# Patient Record
Sex: Female | Born: 1945
Health system: Southern US, Community
[De-identification: ages and names within clinical notes are randomized; demographics above are authoritative.]

## PROBLEM LIST (undated history)

## (undated) DIAGNOSIS — Z86010 Personal history of colonic polyps: Secondary | ICD-10-CM

## (undated) DIAGNOSIS — E785 Hyperlipidemia, unspecified: Secondary | ICD-10-CM

## (undated) DIAGNOSIS — M1990 Unspecified osteoarthritis, unspecified site: Secondary | ICD-10-CM

## (undated) DIAGNOSIS — Z720 Tobacco use: Secondary | ICD-10-CM

## (undated) DIAGNOSIS — E039 Hypothyroidism, unspecified: Secondary | ICD-10-CM

## (undated) DIAGNOSIS — E079 Disorder of thyroid, unspecified: Secondary | ICD-10-CM

## (undated) DIAGNOSIS — H269 Unspecified cataract: Secondary | ICD-10-CM

## (undated) HISTORY — PX: CATARACT EXTRACTION: SUR2

## (undated) HISTORY — DX: Unspecified cataract: H26.9

## (undated) HISTORY — DX: Tobacco use: Z72.0

## (undated) HISTORY — PX: COLONOSCOPY: SHX174

## (undated) HISTORY — DX: Hyperlipidemia, unspecified: E78.5

## (undated) HISTORY — DX: Disorder of thyroid, unspecified: E07.9

## (undated) HISTORY — DX: Personal history of colonic polyps: Z86.010

---

## 1997-05-17 ENCOUNTER — Other Ambulatory Visit: Admission: RE | Admit: 1997-05-17 | Discharge: 1997-05-17 | Payer: Self-pay | Admitting: Obstetrics & Gynecology

## 1998-09-29 ENCOUNTER — Other Ambulatory Visit: Admission: RE | Admit: 1998-09-29 | Discharge: 1998-09-29 | Payer: Self-pay | Admitting: Obstetrics & Gynecology

## 1999-03-23 HISTORY — PX: VAGINAL HYSTERECTOMY: SUR661

## 1999-05-20 ENCOUNTER — Inpatient Hospital Stay (HOSPITAL_COMMUNITY): Admission: RE | Admit: 1999-05-20 | Discharge: 1999-05-23 | Payer: Self-pay | Admitting: Obstetrics & Gynecology

## 1999-12-29 ENCOUNTER — Other Ambulatory Visit: Admission: RE | Admit: 1999-12-29 | Discharge: 1999-12-29 | Payer: Self-pay | Admitting: Obstetrics & Gynecology

## 2001-03-17 ENCOUNTER — Other Ambulatory Visit: Admission: RE | Admit: 2001-03-17 | Discharge: 2001-03-17 | Payer: Self-pay | Admitting: Family Medicine

## 2002-05-15 ENCOUNTER — Other Ambulatory Visit: Admission: RE | Admit: 2002-05-15 | Discharge: 2002-05-15 | Payer: Self-pay | Admitting: Obstetrics & Gynecology

## 2003-11-05 ENCOUNTER — Other Ambulatory Visit: Admission: RE | Admit: 2003-11-05 | Discharge: 2003-11-05 | Payer: Self-pay | Admitting: Obstetrics & Gynecology

## 2013-09-19 HISTORY — PX: ROTATOR CUFF REPAIR: SHX139

## 2014-04-24 ENCOUNTER — Encounter: Payer: Self-pay | Admitting: Internal Medicine

## 2014-05-27 ENCOUNTER — Ambulatory Visit (AMBULATORY_SURGERY_CENTER): Payer: Self-pay

## 2014-05-27 VITALS — Ht 66.0 in | Wt 181.8 lb

## 2014-05-27 DIAGNOSIS — Z1211 Encounter for screening for malignant neoplasm of colon: Secondary | ICD-10-CM

## 2014-05-27 NOTE — Progress Notes (Signed)
No allergies to eggs or soy No diet/weight loss meds No home oxygen No past problems with anesthesia  No email 

## 2014-06-04 ENCOUNTER — Other Ambulatory Visit (HOSPITAL_COMMUNITY): Payer: Self-pay | Admitting: Orthopedic Surgery

## 2014-06-05 ENCOUNTER — Encounter: Payer: Self-pay | Admitting: Endocrinology

## 2014-06-05 ENCOUNTER — Ambulatory Visit (INDEPENDENT_AMBULATORY_CARE_PROVIDER_SITE_OTHER): Payer: Medicare HMO | Admitting: Endocrinology

## 2014-06-05 VITALS — BP 120/70 | HR 74 | Temp 97.5°F | Ht 66.0 in | Wt 181.0 lb

## 2014-06-05 DIAGNOSIS — E039 Hypothyroidism, unspecified: Secondary | ICD-10-CM

## 2014-06-05 MED ORDER — LEVOTHYROXINE SODIUM 125 MCG PO TABS: 125.0000 ug | ORAL_TABLET | Freq: Every day | ORAL | Status: DC

## 2014-06-05 NOTE — Patient Instructions (Signed)
i have sent a prescription to your pharmacy, to refill the levothyroxine.   Please return in 1 year.  please call [phone], to get an appointment with a new primary provider.        Hypothyroidism The thyroid is a large gland located in the lower front of your neck. The thyroid gland helps control metabolism. Metabolism is how your body handles food. It controls metabolism with the hormone thyroxine. When this gland is underactive (hypothyroid), it produces too little hormone.  CAUSES These include:   Absence or destruction of thyroid tissue.  Goiter due to iodine deficiency.  Goiter due to medications.  Congenital defects (since birth).  Problems with the pituitary. This causes a lack of TSH (thyroid stimulating hormone). This hormone tells the thyroid to turn out more hormone. SYMPTOMS  Lethargy (feeling as though you have no energy)  Cold intolerance  Weight gain (in spite of normal food intake)  Dry skin  Coarse hair  Menstrual irregularity (if severe, may lead to infertility)  Slowing of thought processes Cardiac problems are also caused by insufficient amounts of thyroid hormone. Hypothyroidism in the newborn is cretinism, and is an extreme form. It is important that this form be treated adequately and immediately or it will lead rapidly to retarded physical and mental development. DIAGNOSIS  To prove hypothyroidism, your caregiver may do blood tests and ultrasound tests. Sometimes the signs are hidden. It may be necessary for your caregiver to watch this illness with blood tests either before or after diagnosis and treatment. TREATMENT  Low levels of thyroid hormone are increased by using synthetic thyroid hormone. This is a safe, effective treatment. It usually takes about four weeks to gain the full effects of the medication. After you have the full effect of the medication, it will generally take another four weeks for problems to leave. Your caregiver may start you  on low doses. If you have had heart problems the dose may be gradually increased. It is generally not an emergency to get rapidly to normal. HOME CARE INSTRUCTIONS   Take your medications as your caregiver suggests. Let your caregiver know of any medications you are taking or start taking. Your caregiver will help you with dosage schedules.  As your condition improves, your dosage needs may increase. It will be necessary to have continuing blood tests as suggested by your caregiver.  Report all suspected medication side effects to your caregiver. SEEK MEDICAL CARE IF: Seek medical care if you develop:  Sweating.  Tremulousness (tremors).  Anxiety.  Rapid weight loss.  Heat intolerance.  Emotional swings.  Diarrhea.  Weakness. SEEK IMMEDIATE MEDICAL CARE IF:  You develop chest pain, an irregular heart beat (palpitations), or a rapid heart beat. MAKE SURE YOU:   Understand these instructions.  Will watch your condition.  Will get help right away if you are not doing well or get worse. Document Released: 03/08/2005 Document Revised: 05/31/2011 Document Reviewed: 10/27/2007 [facility] Patient Information 2015 [place], [place]. This information is not intended to replace advice given to you by your health care provider. Make sure you discuss any questions you have with your health care provider.

## 2014-06-05 NOTE — Progress Notes (Signed)
Subjective:    Patient ID: Bianca Collins, female    DOB: 09/01/1945, 69 y.o.   MRN: [id number]  HPI Pt reports hypothyroidism was dx'ed in 2002.  she has been on prescribed thyroid hormone therapy continuously since then.  she has never taken kelp or any other type of non-prescribed thyroid product. she has never had thyroid imaging.  She is not considering a pregnancy.  she has never had thyroid surgery, or XRT to the neck.  she has never been on amiodarone or lithium.  She ran out of her synthroid 3 days ago.  She has moderate pain at the right hip, but no assoc numbness (she will have THR soon).   Past Medical History  Diagnosis Date  . Tobacco abuse     Past Surgical History  Procedure Laterality Date  . Vaginal hysterectomy  2001    complete  . Rotator cuff repair  09-2013    History   Social History  . Marital Status: Divorced    Spouse Name: N/A  . Number of Children: N/A  . Years of Education: N/A   Occupational History  . Not on file.   Social History Main Topics  . Smoking status: Current Every Day Smoker  . Smokeless tobacco: Never Used  . Alcohol Use: 8.4 oz/week    14 Glasses of wine per week  . Drug Use: No  . Sexual Activity: Not on file   Other Topics Concern  . Not on file   Social History Narrative    Current Outpatient Prescriptions on File Prior to Visit  Medication Sig Dispense Refill  . Ascorbic Acid (VITAMIN C PO) Take 1 tablet by mouth 3 (three) times a week.     . estradiol (ESTRACE) 1 MG tablet Take 1 mg by mouth daily.    . Multiple Vitamin (MULTIVITAMIN) tablet Take 1 tablet by mouth daily.     No current facility-administered medications on file prior to visit.    No Known Allergies  Family History  Problem Relation Age of Onset  . Colon cancer Neg Hx   . Thyroid disease Neg Hx     BP 120/70 mmHg  Pulse 74  Temp(Src) 97.5 F (36.4 C) (Oral)  Ht 5\' 6"  (1.676 m)  Wt 181 lb (82.101 kg)  BMI 29.23 kg/m2  SpO2  95%     Review of Systems denies depression, hair loss, muscle cramps, sob, weight gain, constipation, numbness, blurry vision, cold intolerance, myalgias, dry skin, rhinorrhea, easy bruising, and syncope.  She has constipation.    Objective:   Physical Exam VS: see vs page GEN: no distress HEAD: head: no deformity eyes: no periorbital swelling, no proptosis external nose and ears are normal mouth: no lesion seen NECK: supple, thyroid is not enlarged CHEST WALL: no deformity LUNGS:  Clear to auscultation.   CV: reg rate and rhythm, no murmur ABD: abdomen is soft, nontender.  no hepatosplenomegaly.  not distended.  no hernia MUSCULOSKELETAL: muscle bulk and strength are grossly normal.  no obvious joint swelling.  gait is normal and steady EXTEMITIES: no deformity. no edema PULSES: no carotid bruit NEURO:  cn 2-12 grossly intact.   readily moves all 4's.  sensation is intact to touch on all 4's SKIN:  Normal texture and temperature.  No rash or suspicious lesion is visible.   NODES:  None palpable at the neck. PSYCH: alert, well-oriented.  Does not appear anxious nor depressed.    outside test results are reviewed: TSH=normal  i have reviewed the following old records: Office notes: she is ween for hypothyroidism.  She has minor sxs, and no abnormalities on physical exam     Assessment & Plan:  Hypothyroidism: well-replaced.  Patient is advised the following: Patient Instructions  i have sent a prescription to your pharmacy, to refill the levothyroxine.   Please return in 1 year.  please call [phone], to get an appointment with a new primary provider.        Hypothyroidism The thyroid is a large gland located in the lower front of your neck. The thyroid gland helps control metabolism. Metabolism is how your body handles food. It controls metabolism with the hormone thyroxine. When this gland is underactive (hypothyroid), it produces too little hormone.   CAUSES These include:   Absence or destruction of thyroid tissue.  Goiter due to iodine deficiency.  Goiter due to medications.  Congenital defects (since birth).  Problems with the pituitary. This causes a lack of TSH (thyroid stimulating hormone). This hormone tells the thyroid to turn out more hormone. SYMPTOMS  Lethargy (feeling as though you have no energy)  Cold intolerance  Weight gain (in spite of normal food intake)  Dry skin  Coarse hair  Menstrual irregularity (if severe, may lead to infertility)  Slowing of thought processes Cardiac problems are also caused by insufficient amounts of thyroid hormone. Hypothyroidism in the newborn is cretinism, and is an extreme form. It is important that this form be treated adequately and immediately or it will lead rapidly to retarded physical and mental development. DIAGNOSIS  To prove hypothyroidism, your caregiver may do blood tests and ultrasound tests. Sometimes the signs are hidden. It may be necessary for your caregiver to watch this illness with blood tests either before or after diagnosis and treatment. TREATMENT  Low levels of thyroid hormone are increased by using synthetic thyroid hormone. This is a safe, effective treatment. It usually takes about four weeks to gain the full effects of the medication. After you have the full effect of the medication, it will generally take another four weeks for problems to leave. Your caregiver may start you on low doses. If you have had heart problems the dose may be gradually increased. It is generally not an emergency to get rapidly to normal. HOME CARE INSTRUCTIONS   Take your medications as your caregiver suggests. Let your caregiver know of any medications you are taking or start taking. Your caregiver will help you with dosage schedules.  As your condition improves, your dosage needs may increase. It will be necessary to have continuing blood tests as suggested by your  caregiver.  Report all suspected medication side effects to your caregiver. SEEK MEDICAL CARE IF: Seek medical care if you develop:  Sweating.  Tremulousness (tremors).  Anxiety.  Rapid weight loss.  Heat intolerance.  Emotional swings.  Diarrhea.  Weakness. SEEK IMMEDIATE MEDICAL CARE IF:  You develop chest pain, an irregular heart beat (palpitations), or a rapid heart beat. MAKE SURE YOU:   Understand these instructions.  Will watch your condition.  Will get help right away if you are not doing well or get worse. Document Released: 03/08/2005 Document Revised: 05/31/2011 Document Reviewed: 10/27/2007 [facility] Patient Information 2015 [place], [place]. This information is not intended to replace advice given to you by your health care provider. Make sure you discuss any questions you have with your health care provider.

## 2014-06-06 DIAGNOSIS — E039 Hypothyroidism, unspecified: Secondary | ICD-10-CM | POA: Insufficient documentation

## 2014-06-07 ENCOUNTER — Encounter: Payer: Self-pay | Admitting: Internal Medicine

## 2014-06-07 ENCOUNTER — Ambulatory Visit (AMBULATORY_SURGERY_CENTER): Payer: Medicare HMO | Admitting: Internal Medicine

## 2014-06-07 VITALS — BP 96/52 | HR 59 | Temp 96.6°F | Resp 18 | Ht 66.0 in | Wt 181.0 lb

## 2014-06-07 DIAGNOSIS — Z1211 Encounter for screening for malignant neoplasm of colon: Secondary | ICD-10-CM

## 2014-06-07 DIAGNOSIS — D123 Benign neoplasm of transverse colon: Secondary | ICD-10-CM

## 2014-06-07 MED ORDER — SODIUM CHLORIDE 0.9 % IV SOLN: 500.0000 mL | INTRAVENOUS | Status: DC

## 2014-06-07 NOTE — Op Note (Signed)
[place]  [address] [place], [id number]   COLONOSCOPY PROCEDURE REPORT  PATIENT: Bianca Collins, Bianca Collins  MR#: [id number] BIRTHDATE: [birth year] , 28  yrs. old GENDER: female ENDOSCOPIST: Gatha Mayer, MD, [facility] PROCEDURE DATE:  06/07/2014 PROCEDURE:   Colonoscopy, screening and Colonoscopy with biopsy First Screening Colonoscopy - Avg.  risk and is 50 yrs.  old or older Yes.  Prior Negative Screening - Now for repeat screening. N/A  History of Adenoma - Now for follow-up colonoscopy & has been > or = to 3 yrs.  N/A ASA CLASS:   Class II INDICATIONS:Screening for colonic neoplasia and Colorectal Neoplasm Risk Assessment for this procedure is average risk. MEDICATIONS: Propofol 240 mg IV and Monitored anesthesia care  DESCRIPTION OF PROCEDURE:   After the risks benefits and alternatives of the procedure were thoroughly explained, informed consent was obtained.  The digital rectal exam revealed no abnormalities of the rectum.   The LB [id number]  endoscope was introduced through the anus and advanced to the cecum, which was identified by both the appendix and ileocecal valve. No adverse events experienced.   The quality of the prep was good.  (MiraLax was used)  The instrument was then slowly withdrawn as the colon was fully examined.      COLON FINDINGS: A sessile polyp measuring 3 mm in size was found in the transverse colon.  A polypectomy was performed with cold forceps.  The resection was complete, the polyp tissue was completely retrieved and sent to histology.   There was diverticulosis noted in the sigmoid colon.   The examination was otherwise normal.   Right colon retroflexion included.  Retroflexed views revealed no abnormalities. The time to cecum = 2.2 Withdrawal time = 12.0   The scope was withdrawn and the procedure completed. COMPLICATIONS: There were no immediate complications.  ENDOSCOPIC IMPRESSION: 1.   Sessile polyp was found in the  transverse colon; polypectomy was performed with cold forceps 2.   Diverticulosis was noted in the sigmoid colon 3.   The examination was otherwise normal - good prep - first screening  RECOMMENDATIONS: Timing of repeat colonoscopy will be determined by pathology findings.  eSigned:  Gatha Mayer, MD, [facility] 06/07/2014 3:12 PM   cc: The Patient

## 2014-06-07 NOTE — Patient Instructions (Addendum)
I found and removed one small polyp that looks benign. You also have a condition called diverticulosis - common and not usually a problem. Please read the handout provided.  I will let you know pathology results and when to have another routine colonoscopy by mail.  I appreciate the opportunity to care for you. Gatha Mayer, MD, FACG  YOU HAD AN ENDOSCOPIC PROCEDURE TODAY AT [facility]:   Refer to the procedure report that was given to you for any specific questions about what was found during the examination.  If the procedure report does not answer your questions, please call your gastroenterologist to clarify.  If you requested that your care partner not be given the details of your procedure findings, then the procedure report has been included in a sealed envelope for you to review at your convenience later.  YOU SHOULD EXPECT: Some feelings of bloating in the abdomen. Passage of more gas than usual.  Walking can help get rid of the air that was put into your GI tract during the procedure and reduce the bloating. If you had a lower endoscopy (such as a colonoscopy or flexible sigmoidoscopy) you may notice spotting of blood in your stool or on the toilet paper. If you underwent a bowel prep for your procedure, you may not have a normal bowel movement for a few days.  Please Note:  You might notice some irritation and congestion in your nose or some drainage.  This is from the oxygen used during your procedure.  There is no need for concern and it should clear up in a day or so.  SYMPTOMS TO REPORT IMMEDIATELY:   Following lower endoscopy (colonoscopy or flexible sigmoidoscopy):  Excessive amounts of blood in the stool  Significant tenderness or worsening of abdominal pains  Swelling of the abdomen that is new, acute  Fever of 100F or higher  For urgent or emergent issues, a gastroenterologist can be reached at any hour by calling [phone].   DIET: Your  first meal following the procedure should be a small meal and then it is ok to progress to your normal diet. Heavy or fried foods are harder to digest and may make you feel nauseous or bloated.  Likewise, meals heavy in dairy and vegetables can increase bloating.  Drink plenty of fluids but you should avoid alcoholic beverages for 24 hours.  ACTIVITY:  You should plan to take it easy for the rest of today and you should NOT DRIVE or use heavy machinery until tomorrow (because of the sedation medicines used during the test).    FOLLOW UP: Our staff will call the number listed on your records the next business day following your procedure to check on you and address any questions or concerns that you may have regarding the information given to you following your procedure. If we do not reach you, we will leave a message.  However, if you are feeling well and you are not experiencing any problems, there is no need to return our call.  We will assume that you have returned to your regular daily activities without incident.  If any biopsies were taken you will be contacted by phone or by letter within the next 1-3 weeks.  Please call us at [phone] if you have not heard about the biopsies in 3 weeks.    SIGNATURES/CONFIDENTIALITY: You and/or your care partner have signed paperwork which will be entered into your electronic medical record.  These signatures attest  to the fact that that the information above on your After Visit Summary has been reviewed and is understood.  Full responsibility of the confidentiality of this discharge information lies with you and/or your care-partner.  Polyp and diverticulosis information given.

## 2014-06-07 NOTE — Progress Notes (Signed)
To recovery, report to Scott, RN, VSS 

## 2014-06-07 NOTE — Progress Notes (Signed)
Called to room to assist during endoscopic procedure.  Patient ID and intended procedure confirmed with present staff. Received instructions for my participation in the procedure from the performing physician.  

## 2014-06-08 NOTE — Pre-Procedure Instructions (Signed)
Bianca Collins  06/08/2014   Your procedure is scheduled on:  March 30  Report to [facility] Admitting at 08:40 AM.  Call this number if you have problems the morning of surgery: [phone]   Remember:   Do not eat food or drink liquids after midnight.   Take these medicines the morning of surgery with A SIP OF WATER: Levothyroxine   STOP Vitamin C, Multiple Vitamin March 23   STOP/ Do not take Aspirin, Aleve, Naproxen, Advil, Ibuprofen, Motrin, Vitamins, Herbs, or Supplements starting March 23   Do not wear jewelry, make-up or nail polish.  Do not wear lotions, powders, or perfumes. You may wear deodorant.  Do not shave 48 hours prior to surgery. Men may shave face and neck.  Do not bring valuables to the hospital.  [facility] is not responsible for any belongings or valuables.               Contacts, dentures or bridgework may not be worn into surgery.  Leave suitcase in the car. After surgery it may be brought to your room.  For patients admitted to the hospital, discharge time is determined by your treatment team.               Special Instructions: [place] - Preparing for Surgery  Before surgery, you can play an important role.  Because skin is not sterile, your skin needs to be as free of germs as possible.  You can reduce the number of germs on you skin by washing with CHG (chlorahexidine gluconate) soap before surgery.  CHG is an antiseptic cleaner which kills germs and bonds with the skin to continue killing germs even after washing.  Please DO NOT use if you have an allergy to CHG or antibacterial soaps.  If your skin becomes reddened/irritated stop using the CHG and inform your nurse when you arrive at Short Stay.  Do not shave (including legs and underarms) for at least 48 hours prior to the first CHG shower.  You may shave your face.  Please follow these instructions carefully:   1.  Shower with CHG Soap the night before surgery and the morning of  Surgery.  2.  If you choose to wash your hair, wash your hair first as usual with your normal shampoo.  3.  After you shampoo, rinse your hair and body thoroughly to remove the shampoo.  4.  Use CHG as you would any other liquid soap.  You can apply CHG directly to the skin and wash gently with scrungie or a clean washcloth.  5.  Apply the CHG Soap to your body ONLY FROM THE NECK DOWN.  Do not use on open wounds or open sores.  Avoid contact with your eyes, ears, mouth and genitals (private parts).  Wash genitals (private parts) with your normal soap.  6.  Wash thoroughly, paying special attention to the area where your surgery will be performed.  7.  Thoroughly rinse your body with warm water from the neck down.  8.  DO NOT shower/wash with your normal soap after using and rinsing off the CHG Soap.  9.  Pat yourself dry with a clean towel.            10.  Wear clean pajamas.            11.  Place clean sheets on your bed the night of your first shower and do not sleep with pets.  Day of Surgery  Do not apply any lotions the morning of surgery.  Please wear clean clothes to the hospital/surgery center.     Please read over the following fact sheets that you were given: Pain Booklet, Coughing and Deep Breathing, Total Joint Packet and Surgical Site Infection Prevention

## 2014-06-10 ENCOUNTER — Encounter (HOSPITAL_COMMUNITY)
Admission: RE | Admit: 2014-06-10 | Discharge: 2014-06-10 | Disposition: A | Discharge status: Home / Self Care | Payer: Medicare HMO | Source: Ambulatory Visit | Attending: Orthopedic Surgery | Admitting: Orthopedic Surgery

## 2014-06-10 ENCOUNTER — Telehealth: Payer: Self-pay

## 2014-06-10 ENCOUNTER — Ambulatory Visit (HOSPITAL_COMMUNITY)
Admission: RE | Admit: 2014-06-10 | Discharge: 2014-06-10 | Disposition: A | Discharge status: Home / Self Care | Payer: Medicare HMO | Source: Ambulatory Visit | Attending: Orthopedic Surgery | Admitting: Orthopedic Surgery

## 2014-06-10 ENCOUNTER — Encounter (HOSPITAL_COMMUNITY): Payer: Self-pay

## 2014-06-10 DIAGNOSIS — F17200 Nicotine dependence, unspecified, uncomplicated: Secondary | ICD-10-CM | POA: Diagnosis not present

## 2014-06-10 DIAGNOSIS — Z01812 Encounter for preprocedural laboratory examination: Secondary | ICD-10-CM | POA: Insufficient documentation

## 2014-06-10 DIAGNOSIS — M1611 Unilateral primary osteoarthritis, right hip: Secondary | ICD-10-CM | POA: Insufficient documentation

## 2014-06-10 DIAGNOSIS — E039 Hypothyroidism, unspecified: Secondary | ICD-10-CM | POA: Diagnosis not present

## 2014-06-10 DIAGNOSIS — Z01818 Encounter for other preprocedural examination: Secondary | ICD-10-CM | POA: Insufficient documentation

## 2014-06-10 DIAGNOSIS — J984 Other disorders of lung: Secondary | ICD-10-CM | POA: Insufficient documentation

## 2014-06-10 HISTORY — DX: Hypothyroidism, unspecified: E03.9

## 2014-06-10 HISTORY — DX: Unspecified osteoarthritis, unspecified site: M19.90

## 2014-06-10 LAB — PROTIME-INR
INR: 0.97 (ref 0.00–1.49)
Prothrombin Time: 12.9 seconds (ref 11.6–15.2)

## 2014-06-10 LAB — CBC
HCT: 45.5 % (ref 36.0–46.0)
Hemoglobin: 15.5 g/dL — ABNORMAL HIGH (ref 12.0–15.0)
MCH: 33.2 pg (ref 26.0–34.0)
MCHC: 34.1 g/dL (ref 30.0–36.0)
MCV: 97.4 fL (ref 78.0–100.0)
Platelets: 235 10*3/uL (ref 150–400)
RBC: 4.67 MIL/uL (ref 3.87–5.11)
RDW: 13.1 % (ref 11.5–15.5)
WBC: 10 10*3/uL (ref 4.0–10.5)

## 2014-06-10 LAB — COMPREHENSIVE METABOLIC PANEL
ALK PHOS: 64 U/L (ref 39–117)
ALT: 12 U/L (ref 0–35)
ANION GAP: 8 (ref 5–15)
AST: 15 U/L (ref 0–37)
Albumin: 4 g/dL (ref 3.5–5.2)
BILIRUBIN TOTAL: 0.4 mg/dL (ref 0.3–1.2)
BUN: 11 mg/dL (ref 6–23)
CHLORIDE: 102 mmol/L (ref 96–112)
CO2: 28 mmol/L (ref 19–32)
Calcium: 9.2 mg/dL (ref 8.4–10.5)
Creatinine, Ser: 0.87 mg/dL (ref 0.50–1.10)
GFR calc Af Amer: 79 mL/min — ABNORMAL LOW (ref >90)
GFR calc non Af Amer: 68 mL/min — ABNORMAL LOW (ref >90)
Glucose, Bld: 96 mg/dL (ref 70–99)
POTASSIUM: 4.2 mmol/L (ref 3.5–5.1)
Sodium: 138 mmol/L (ref 135–145)
Total Protein: 7.2 g/dL (ref 6.0–8.3)

## 2014-06-10 LAB — SURGICAL PCR SCREEN
MRSA, PCR: NEGATIVE
Staphylococcus aureus: NEGATIVE

## 2014-06-10 LAB — APTT: APTT: 36 s (ref 24–37)

## 2014-06-10 NOTE — Progress Notes (Addendum)
Pt denies any heart problems and has never seen a cardiologist.  DA I have called [facility] for an old EKG for comparison.  They do not have one.  Patient also saw Dr. Jaci Carrel, who has since left the practice.   DA

## 2014-06-10 NOTE — Telephone Encounter (Signed)
  Follow up Call-  Call back number 06/07/2014  Post procedure Call Back phone  # [phone]  Permission to leave phone message Yes     Patient questions:  Do you have a fever, pain , or abdominal swelling? No. Pain Score  0 *  Have you tolerated food without any problems? Yes.    Have you been able to return to your normal activities? Yes.    Do you have any questions about your discharge instructions: Diet   No. Medications  No. Follow up visit  No.  Do you have questions or concerns about your Care? No.  Actions: * If pain score is 4 or above: No action needed, pain <4.

## 2014-06-10 NOTE — Progress Notes (Addendum)
Anesthesia Chart Review:  Pt is 69 year old female scheduled for R total hip arthroplasty on 06/19/2014 with Dr. Sharol Given.   PMH includes: hypothyroidism. Current smoker. BMI 29.   Preoperative labs reviewed.    Chest x-ray reviewed.  -No radiographic evidence of acute cardiopulmonary disease.  -Nodular density at the base the right lung as well as a small 2 mm-3 mm in density at the upper aspect of the right lung. Given the patient's smoking history, a non-emergent noncontrast CT of the chest is recommended for further evaluation.  -Asymmetric elevation of the right hemidiaphragm.  Called and left voicemail for [place] in Dr. Jess Barters office about abnormal CXR findings.   EKG: Sinus bradycardia (55 bpm). Cannot rule out Anterior infarct, age undetermined. No old EKG for comparison.   If no changes, I anticipate pt can proceed with surgery as scheduled.   Willeen Cass, FNP-BC [facility] Short Stay Surgical Center/Anesthesiology Phone: [phone] 06/10/2014 3:05 PM  Addendum: Received a phone call from [place] confirming she received message regarding abnormal CXR.  She will have Dr. Sharol Given review to determine follow-up recommendations.   George Hugh [facility] Short Stay Center/Anesthesiology Phone [phone] 06/13/2014 11:23 AM

## 2014-06-13 ENCOUNTER — Encounter: Payer: Self-pay | Admitting: Internal Medicine

## 2014-06-13 DIAGNOSIS — Z86010 Personal history of colonic polyps: Secondary | ICD-10-CM

## 2014-06-13 DIAGNOSIS — Z860101 Personal history of adenomatous and serrated colon polyps: Secondary | ICD-10-CM | POA: Insufficient documentation

## 2014-06-13 HISTORY — DX: Personal history of adenomatous and serrated colon polyps: Z86.0101

## 2014-06-13 HISTORY — DX: Personal history of colonic polyps: Z86.010

## 2014-06-13 NOTE — Progress Notes (Signed)
Quick Note:  3 mm adenoma - repeat colonoscopy 2021 ______

## 2014-06-18 MED ORDER — CEFAZOLIN SODIUM-DEXTROSE 2-3 GM-% IV SOLR
2.0000 g | INTRAVENOUS | Status: AC
  Administered 2014-06-19: 2 g via INTRAVENOUS
  Filled 2014-06-18: qty 50

## 2014-06-19 ENCOUNTER — Inpatient Hospital Stay (HOSPITAL_COMMUNITY): Payer: Medicare HMO

## 2014-06-19 ENCOUNTER — Inpatient Hospital Stay (HOSPITAL_COMMUNITY): Payer: Medicare HMO | Admitting: Anesthesiology

## 2014-06-19 ENCOUNTER — Inpatient Hospital Stay (HOSPITAL_COMMUNITY)
Admission: RE | Admit: 2014-06-19 | Discharge: 2014-06-22 | DRG: 470 | Disposition: A | Discharge status: Home Health | Payer: Medicare HMO | Source: Ambulatory Visit | Attending: Orthopedic Surgery | Admitting: Orthopedic Surgery

## 2014-06-19 ENCOUNTER — Inpatient Hospital Stay (HOSPITAL_COMMUNITY): Payer: Medicare HMO | Admitting: Emergency Medicine

## 2014-06-19 ENCOUNTER — Encounter (HOSPITAL_COMMUNITY): Payer: Self-pay | Admitting: *Deleted

## 2014-06-19 ENCOUNTER — Encounter (HOSPITAL_COMMUNITY)
Admission: RE | Disposition: A | Discharge status: Home Health | Payer: Self-pay | Source: Ambulatory Visit | Attending: Orthopedic Surgery

## 2014-06-19 DIAGNOSIS — F17210 Nicotine dependence, cigarettes, uncomplicated: Secondary | ICD-10-CM | POA: Diagnosis present

## 2014-06-19 DIAGNOSIS — Z86010 Personal history of colonic polyps: Secondary | ICD-10-CM

## 2014-06-19 DIAGNOSIS — M1612 Unilateral primary osteoarthritis, left hip: Principal | ICD-10-CM | POA: Diagnosis present

## 2014-06-19 DIAGNOSIS — Z7982 Long term (current) use of aspirin: Secondary | ICD-10-CM | POA: Diagnosis not present

## 2014-06-19 DIAGNOSIS — R918 Other nonspecific abnormal finding of lung field: Secondary | ICD-10-CM

## 2014-06-19 DIAGNOSIS — E039 Hypothyroidism, unspecified: Secondary | ICD-10-CM | POA: Diagnosis present

## 2014-06-19 DIAGNOSIS — Z79899 Other long term (current) drug therapy: Secondary | ICD-10-CM

## 2014-06-19 DIAGNOSIS — Z96649 Presence of unspecified artificial hip joint: Secondary | ICD-10-CM

## 2014-06-19 DIAGNOSIS — Z90710 Acquired absence of both cervix and uterus: Secondary | ICD-10-CM | POA: Diagnosis not present

## 2014-06-19 DIAGNOSIS — Z96641 Presence of right artificial hip joint: Secondary | ICD-10-CM

## 2014-06-19 HISTORY — PX: TOTAL HIP ARTHROPLASTY: SHX124

## 2014-06-19 SURGERY — ARTHROPLASTY, HIP, TOTAL,POSTERIOR APPROACH
Anesthesia: Spinal | Site: Hip | Laterality: Right

## 2014-06-19 MED ORDER — LIDOCAINE HCL (CARDIAC) 20 MG/ML IV SOLN
INTRAVENOUS | Status: DC | PRN
  Administered 2014-06-19: 50 mg via INTRAVENOUS

## 2014-06-19 MED ORDER — ASPIRIN EC 325 MG PO TBEC
325.0000 mg | DELAYED_RELEASE_TABLET | Freq: Every day | ORAL | Status: DC
  Administered 2014-06-20 – 2014-06-22 (×3): 325 mg via ORAL
  Filled 2014-06-19 (×3): qty 1

## 2014-06-19 MED ORDER — SODIUM CHLORIDE 0.9 % IV SOLN
INTRAVENOUS | Status: DC
  Administered 2014-06-19: via INTRAVENOUS

## 2014-06-19 MED ORDER — SODIUM CHLORIDE 0.9 % IR SOLN
Status: DC | PRN
  Administered 2014-06-19: 1000 mL

## 2014-06-19 MED ORDER — KETOROLAC TROMETHAMINE 15 MG/ML IJ SOLN
7.5000 mg | Freq: Four times a day (QID) | INTRAMUSCULAR | Status: AC
  Administered 2014-06-19 – 2014-06-20 (×4): 7.5 mg via INTRAVENOUS
  Filled 2014-06-19 (×4): qty 1

## 2014-06-19 MED ORDER — BISACODYL 5 MG PO TBEC
5.0000 mg | DELAYED_RELEASE_TABLET | Freq: Every day | ORAL | Status: DC | PRN
Start: 2014-06-19 — End: 2014-06-22

## 2014-06-19 MED ORDER — MIDAZOLAM HCL 2 MG/2ML IJ SOLN
INTRAMUSCULAR | Status: AC
  Filled 2014-06-19: qty 2

## 2014-06-19 MED ORDER — PROPOFOL 10 MG/ML IV BOLUS
INTRAVENOUS | Status: DC | PRN
  Administered 2014-06-19: 30 mg via INTRAVENOUS

## 2014-06-19 MED ORDER — ONDANSETRON HCL 4 MG/2ML IJ SOLN
INTRAMUSCULAR | Status: DC | PRN
  Administered 2014-06-19: 4 mg via INTRAVENOUS

## 2014-06-19 MED ORDER — ROCURONIUM BROMIDE 50 MG/5ML IV SOLN
INTRAVENOUS | Status: AC
  Filled 2014-06-19: qty 1

## 2014-06-19 MED ORDER — MIDAZOLAM HCL 5 MG/5ML IJ SOLN
INTRAMUSCULAR | Status: DC | PRN
  Administered 2014-06-19: 2 mg via INTRAVENOUS

## 2014-06-19 MED ORDER — METHOCARBAMOL 1000 MG/10ML IJ SOLN: 500.0000 mg | Freq: Four times a day (QID) | INTRAVENOUS | Status: DC | PRN

## 2014-06-19 MED ORDER — HYDROMORPHONE HCL 1 MG/ML IJ SOLN: 0.2500 mg | INTRAMUSCULAR | Status: DC | PRN

## 2014-06-19 MED ORDER — PROMETHAZINE HCL 25 MG/ML IJ SOLN
6.2500 mg | INTRAMUSCULAR | Status: DC | PRN
Start: 2014-06-19 — End: 2014-06-19

## 2014-06-19 MED ORDER — EPHEDRINE SULFATE 50 MG/ML IJ SOLN
INTRAMUSCULAR | Status: DC | PRN
  Administered 2014-06-19 (×2): 10 mg via INTRAVENOUS

## 2014-06-19 MED ORDER — OXYCODONE HCL 5 MG PO TABS
5.0000 mg | ORAL_TABLET | ORAL | Status: DC | PRN
  Administered 2014-06-19 – 2014-06-21 (×7): 10 mg via ORAL
  Filled 2014-06-19 (×8): qty 2

## 2014-06-19 MED ORDER — ONDANSETRON HCL 4 MG PO TABS: 4.0000 mg | ORAL_TABLET | Freq: Four times a day (QID) | ORAL | Status: DC | PRN

## 2014-06-19 MED ORDER — LACTATED RINGERS IV SOLN
INTRAVENOUS | Status: DC
  Administered 2014-06-19 (×2): via INTRAVENOUS

## 2014-06-19 MED ORDER — DEXAMETHASONE SODIUM PHOSPHATE 4 MG/ML IJ SOLN
INTRAMUSCULAR | Status: AC
  Filled 2014-06-19: qty 2

## 2014-06-19 MED ORDER — KETOROLAC TROMETHAMINE 30 MG/ML IJ SOLN: 30.0000 mg | Freq: Once | INTRAMUSCULAR | Status: DC | PRN

## 2014-06-19 MED ORDER — PROPOFOL 10 MG/ML IV BOLUS
INTRAVENOUS | Status: AC
  Filled 2014-06-19: qty 20

## 2014-06-19 MED ORDER — FENTANYL CITRATE 0.05 MG/ML IJ SOLN
INTRAMUSCULAR | Status: DC | PRN
  Administered 2014-06-19: 50 ug via INTRAVENOUS

## 2014-06-19 MED ORDER — ONDANSETRON HCL 4 MG/2ML IJ SOLN: 4.0000 mg | Freq: Four times a day (QID) | INTRAMUSCULAR | Status: DC | PRN

## 2014-06-19 MED ORDER — LEVOTHYROXINE SODIUM 125 MCG PO TABS
125.0000 ug | ORAL_TABLET | Freq: Every day | ORAL | Status: DC
  Administered 2014-06-20 – 2014-06-22 (×3): 125 ug via ORAL
  Filled 2014-06-19 (×3): qty 1

## 2014-06-19 MED ORDER — SENNOSIDES-DOCUSATE SODIUM 8.6-50 MG PO TABS: 1.0000 | ORAL_TABLET | Freq: Every evening | ORAL | Status: DC | PRN

## 2014-06-19 MED ORDER — FENTANYL CITRATE 0.05 MG/ML IJ SOLN
INTRAMUSCULAR | Status: AC
  Filled 2014-06-19: qty 5

## 2014-06-19 MED ORDER — PROPOFOL INFUSION 10 MG/ML OPTIME
INTRAVENOUS | Status: DC | PRN
  Administered 2014-06-19: 75 ug/kg/min via INTRAVENOUS

## 2014-06-19 MED ORDER — METHOCARBAMOL 1000 MG/10ML IJ SOLN
500.0000 mg | INTRAVENOUS | Status: DC
  Administered 2014-06-19: 500 mg via INTRAVENOUS
  Filled 2014-06-19: qty 5

## 2014-06-19 MED ORDER — ONDANSETRON HCL 4 MG/2ML IJ SOLN
INTRAMUSCULAR | Status: AC
  Filled 2014-06-19: qty 2

## 2014-06-19 MED ORDER — LIDOCAINE HCL (CARDIAC) 20 MG/ML IV SOLN
INTRAVENOUS | Status: AC
  Filled 2014-06-19: qty 5

## 2014-06-19 MED ORDER — ESTRADIOL 1 MG PO TABS
1.0000 mg | ORAL_TABLET | Freq: Every day | ORAL | Status: DC
  Administered 2014-06-19 – 2014-06-22 (×4): 1 mg via ORAL
  Filled 2014-06-19 (×4): qty 1

## 2014-06-19 MED ORDER — METHOCARBAMOL 500 MG PO TABS
500.0000 mg | ORAL_TABLET | Freq: Four times a day (QID) | ORAL | Status: DC | PRN
  Administered 2014-06-21 (×2): 500 mg via ORAL
  Filled 2014-06-19 (×2): qty 1

## 2014-06-19 MED ORDER — DIPHENHYDRAMINE HCL 12.5 MG/5ML PO ELIX: 12.5000 mg | ORAL_SOLUTION | ORAL | Status: DC | PRN

## 2014-06-19 MED ORDER — ACETAMINOPHEN 325 MG PO TABS: 650.0000 mg | ORAL_TABLET | Freq: Four times a day (QID) | ORAL | Status: DC | PRN

## 2014-06-19 MED ORDER — DOCUSATE SODIUM 100 MG PO CAPS
100.0000 mg | ORAL_CAPSULE | Freq: Two times a day (BID) | ORAL | Status: DC
  Administered 2014-06-19 – 2014-06-22 (×6): 100 mg via ORAL
  Filled 2014-06-19 (×7): qty 1

## 2014-06-19 MED ORDER — PHENYLEPHRINE HCL 10 MG/ML IJ SOLN
INTRAMUSCULAR | Status: DC | PRN
  Administered 2014-06-19 (×2): 40 ug via INTRAVENOUS

## 2014-06-19 MED ORDER — ALUM & MAG HYDROXIDE-SIMETH 200-200-20 MG/5ML PO SUSP: 30.0000 mL | ORAL | Status: DC | PRN

## 2014-06-19 MED ORDER — METOCLOPRAMIDE HCL 5 MG PO TABS: 5.0000 mg | ORAL_TABLET | Freq: Three times a day (TID) | ORAL | Status: DC | PRN

## 2014-06-19 MED ORDER — PHENOL 1.4 % MT LIQD: 1.0000 | OROMUCOSAL | Status: DC | PRN

## 2014-06-19 MED ORDER — MEPERIDINE HCL 25 MG/ML IJ SOLN: 6.2500 mg | INTRAMUSCULAR | Status: DC | PRN

## 2014-06-19 MED ORDER — METOCLOPRAMIDE HCL 5 MG/ML IJ SOLN: 5.0000 mg | Freq: Three times a day (TID) | INTRAMUSCULAR | Status: DC | PRN

## 2014-06-19 MED ORDER — ACETAMINOPHEN 650 MG RE SUPP: 650.0000 mg | Freq: Four times a day (QID) | RECTAL | Status: DC | PRN

## 2014-06-19 MED ORDER — FERROUS SULFATE 325 (65 FE) MG PO TABS
325.0000 mg | ORAL_TABLET | Freq: Three times a day (TID) | ORAL | Status: DC
  Administered 2014-06-19 – 2014-06-22 (×6): 325 mg via ORAL
  Filled 2014-06-19 (×7): qty 1

## 2014-06-19 MED ORDER — HYDROMORPHONE HCL 1 MG/ML IJ SOLN
1.0000 mg | INTRAMUSCULAR | Status: DC | PRN
  Administered 2014-06-19: 1 mg via INTRAVENOUS
  Filled 2014-06-19: qty 1

## 2014-06-19 MED ORDER — CEFAZOLIN SODIUM 1-5 GM-% IV SOLN
1.0000 g | Freq: Four times a day (QID) | INTRAVENOUS | Status: AC
  Administered 2014-06-19 (×2): 1 g via INTRAVENOUS
  Filled 2014-06-19 (×2): qty 50

## 2014-06-19 MED ORDER — MENTHOL 3 MG MT LOZG: 1.0000 | LOZENGE | OROMUCOSAL | Status: DC | PRN

## 2014-06-19 SURGICAL SUPPLY — 51 items
BLADE SAW SAG 73X25 THK (BLADE) ×1
BLADE SAW SGTL 73X25 THK (BLADE) ×1 IMPLANT
BLADE SURG 10 STRL SS (BLADE) ×2 IMPLANT
BLADE SURG 21 STRL SS (BLADE) ×2 IMPLANT
BRUSH FEMORAL CANAL (MISCELLANEOUS) IMPLANT
COVER BACK TABLE 24X17X13 BIG (DRAPES) IMPLANT
COVER SURGICAL LIGHT HANDLE (MISCELLANEOUS) ×2 IMPLANT
DRAPE IMP U-DRAPE 54X76 (DRAPES) ×2 IMPLANT
DRAPE INCISE IOBAN 85X60 (DRAPES) ×2 IMPLANT
DRAPE ORTHO SPLIT 77X108 STRL (DRAPES) ×2
DRAPE SURG ORHT 6 SPLT 77X108 (DRAPES) ×2 IMPLANT
DRAPE U-SHAPE 47X51 STRL (DRAPES) ×2 IMPLANT
DRSG MEPILEX BORDER 4X12 (GAUZE/BANDAGES/DRESSINGS) ×2 IMPLANT
DRSG MEPILEX BORDER 4X8 (GAUZE/BANDAGES/DRESSINGS) IMPLANT
DURAPREP 26ML APPLICATOR (WOUND CARE) ×2 IMPLANT
ELECT BLADE 6.5 EXT (BLADE) ×2 IMPLANT
ELECT CAUTERY BLADE 6.4 (BLADE) IMPLANT
ELECT REM PT RETURN 9FT ADLT (ELECTROSURGICAL) ×2
ELECTRODE REM PT RTRN 9FT ADLT (ELECTROSURGICAL) ×1 IMPLANT
FEMORAL HEAD HIP 36MM+3.5 (Hips) ×2 IMPLANT
FEMORAL HEAD HIP 36MM+7 (Hips) ×2 IMPLANT
GLOVE BIOGEL PI IND STRL 9 (GLOVE) ×1 IMPLANT
GLOVE BIOGEL PI INDICATOR 9 (GLOVE) ×1
GLOVE SURG ORTHO 9.0 STRL STRW (GLOVE) ×2 IMPLANT
GOWN STRL REUS W/ TWL XL LVL3 (GOWN DISPOSABLE) ×2 IMPLANT
GOWN STRL REUS W/TWL XL LVL3 (GOWN DISPOSABLE) ×2
HANDPIECE INTERPULSE COAX TIP (DISPOSABLE)
HIP STEM FEM 3 STD (Stem) ×2 IMPLANT
KIT BASIN OR (CUSTOM PROCEDURE TRAY) ×2 IMPLANT
KIT ROOM TURNOVER OR (KITS) ×2 IMPLANT
LINER E FLAT (Liner) ×2 IMPLANT
MANIFOLD NEPTUNE II (INSTRUMENTS) ×2 IMPLANT
NS IRRIG 1000ML POUR BTL (IV SOLUTION) ×2 IMPLANT
PACK TOTAL JOINT (CUSTOM PROCEDURE TRAY) ×2 IMPLANT
PACK UNIVERSAL I (CUSTOM PROCEDURE TRAY) ×2 IMPLANT
PAD ARMBOARD 7.5X6 YLW CONV (MISCELLANEOUS) ×4 IMPLANT
PRESSURIZER FEMORAL UNIV (MISCELLANEOUS) IMPLANT
SET HNDPC FAN SPRY TIP SCT (DISPOSABLE) IMPLANT
SHELL ACETABULAR SZ 54MM E (Shell) ×2 IMPLANT
STAPLER VISISTAT 35W (STAPLE) ×2 IMPLANT
SUT ETHIBOND NAB CT1 #1 30IN (SUTURE) ×2 IMPLANT
SUT VIC AB 0 CT1 27 (SUTURE) ×1
SUT VIC AB 0 CT1 27XBRD ANBCTR (SUTURE) ×1 IMPLANT
SUT VIC AB 1 CTX 36 (SUTURE) ×1
SUT VIC AB 1 CTX36XBRD ANBCTR (SUTURE) ×1 IMPLANT
SUT VIC AB 2-0 CTB1 (SUTURE) IMPLANT
TOWEL OR 17X24 6PK STRL BLUE (TOWEL DISPOSABLE) ×2 IMPLANT
TOWEL OR 17X26 10 PK STRL BLUE (TOWEL DISPOSABLE) ×2 IMPLANT
TOWER CARTRIDGE SMART MIX (DISPOSABLE) IMPLANT
TRAY FOLEY CATH 16FRSI W/METER (SET/KITS/TRAYS/PACK) IMPLANT
WATER STERILE IRR 1000ML POUR (IV SOLUTION) IMPLANT

## 2014-06-19 NOTE — Anesthesia Postprocedure Evaluation (Signed)
Anesthesia Post Note  Patient: Bianca Collins  Procedure(s) Performed: Procedure(s) (LRB): TOTAL HIP ARTHROPLASTY (Right)  Anesthesia type: Spinal  Patient location: PACU  Post pain: Pain level controlled  Post assessment: Post-op Vital signs reviewed  Last Vitals: BP 105/59 mmHg  Pulse 76  Temp(Src) 36.7 C (Oral)  Resp 16  Wt 180 lb (81.647 kg)  SpO2 100%  Post vital signs: Reviewed  Level of consciousness: sedated  Complications: No apparent anesthesia complications

## 2014-06-19 NOTE — Transfer of Care (Signed)
Immediate Anesthesia Transfer of Care Note  Patient: Bianca Collins  Procedure(s) Performed: Procedure(s): TOTAL HIP ARTHROPLASTY (Right)  Patient Location: PACU  Anesthesia Type:Spinal  Level of Consciousness: awake, alert , oriented and patient cooperative  Airway & Oxygen Therapy: Patient Spontanous Breathing  Post-op Assessment: Report given to RN and Post -op Vital signs reviewed and stable  Post vital signs: Reviewed and stable  Last Vitals:  Filed Vitals:   06/19/14 0901  BP: 138/63  Pulse: 65  Temp: 36.6 C  Resp: 18    Complications: No apparent anesthesia complications

## 2014-06-19 NOTE — H&P (Signed)
TOTAL HIP ADMISSION H&P  Patient is admitted for right total hip arthroplasty.  Subjective:  Chief Complaint: right hip pain  HPI: Bianca Collins, 69 y.o. female, has a history of pain and functional disability in the right hip(s) due to arthritis and patient has failed non-surgical conservative treatments for greater than 12 weeks to include NSAID's and/or analgesics, use of assistive devices, weight reduction as appropriate and activity modification.  Onset of symptoms was gradual starting 8 years ago with gradually worsening course since that time.The patient noted no past surgery on the right hip(s).  Patient currently rates pain in the right hip at 8 out of 10 with activity. Patient has worsening of pain with activity and weight bearing, trendelenberg gait, pain that interfers with activities of daily living, pain with passive range of motion and crepitus. Patient has evidence of subchondral cysts, subchondral sclerosis, periarticular osteophytes and joint space narrowing by imaging studies. This condition presents safety issues increasing the risk of falls. This patient has had avascular necrosis of the hip, acetabular fracture, hip dysplasia.  There is no current active infection.  Patient Active Problem List   Diagnosis Date Noted  . Hx of adenomatous polyp of colon 06/13/2014  . Hypothyroidism 06/06/2014   Past Medical History  Diagnosis Date  . Tobacco abuse   . Thyroid disease   . Hypothyroidism   . Arthritis   . Hx of adenomatous polyp of colon 06/13/2014    Past Surgical History  Procedure Laterality Date  . Vaginal hysterectomy  2001    complete  . Rotator cuff repair  09-2013    No prescriptions prior to admission   No Known Allergies  History  Substance Use Topics  . Smoking status: Current Every Day Smoker -- 1.00 packs/day for 30 years    Types: Cigarettes  . Smokeless tobacco: Never Used  . Alcohol Use: 8.4 oz/week    14 Glasses of wine per week    Family History   Problem Relation Age of Onset  . Colon cancer Neg Hx   . Thyroid disease Neg Hx   . Stomach cancer Neg Hx      Review of Systems  All other systems reviewed and are negative.   Objective:  Physical Exam  Vital signs in last 24 hours:    Labs:   Estimated body mass index is 29.36 kg/(m^2) as calculated from the following:   Height as of 05/27/14: 5\' 6"  (1.676 m).   Weight as of 05/27/14: 82.464 kg (181 lb 12.8 oz).   Imaging Review Plain radiographs demonstrate moderate degenerative joint disease of the right hip(s). The bone quality appears to be adequate for age and reported activity level.  Assessment/Plan:  End stage arthritis, right hip(s)  The patient history, physical examination, clinical judgement of the provider and imaging studies are consistent with end stage degenerative joint disease of the right hip(s) and total hip arthroplasty is deemed medically necessary. The treatment options including medical management, injection therapy, arthroscopy and arthroplasty were discussed at length. The risks and benefits of total hip arthroplasty were presented and reviewed. The risks due to aseptic loosening, infection, stiffness, dislocation/subluxation,  thromboembolic complications and other imponderables were discussed.  The patient acknowledged the explanation, agreed to proceed with the plan and consent was signed. Patient is being admitted for inpatient treatment for surgery, pain control, PT, OT, prophylactic antibiotics, VTE prophylaxis, progressive ambulation and ADL's and discharge planning.The patient is planning to be discharged home with home health services

## 2014-06-19 NOTE — Progress Notes (Signed)
Report given to maria rn as caregiver 

## 2014-06-19 NOTE — Op Note (Signed)
06/19/2014  12:09 PM  PATIENT:  Bianca Collins    PRE-OPERATIVE DIAGNOSIS:  Osteoarthritis Right Hip  POST-OPERATIVE DIAGNOSIS:  Same  PROCEDURE:  TOTAL HIP ARTHROPLASTY  SURGEON:  Newt Minion, MD  PHYSICIAN ASSISTANT:None ANESTHESIA:   General  PREOPERATIVE INDICATIONS:  [name] is a  69 y.o. female with a diagnosis of Osteoarthritis Right Hip who failed conservative measures and elected for surgical management.    The risks benefits and alternatives were discussed with the patient preoperatively including but not limited to the risks of infection, bleeding, nerve injury, cardiopulmonary complications, the need for revision surgery, among others, and the patient was willing to proceed.  OPERATIVE IMPLANTS: Medacta Size 52 mm acetabulum. 0 polyethylene liner. 36 mm ball. +7 mm neck Size 2 stem.  OPERATIVE FINDINGS: Multiple loose bodies in the acetabulum.  OPERATIVE PROCEDURE: Patient was brought to the operating room and underwent a spinal anesthetic. After adequate levels of anesthesia were obtained patient was placed in the left lateral decubitus position with the right side up and the right lower extremity was prepped using DuraPrep draped into a sterile field Ioban was used to cover all exposed skin. A timeout was called. A posterior lateral incision was made this was carried down through the tensor fascia lata which was split. The P aforementioned short external rotators and capsules were incised off the femoral neck and retracted laterally. The femoral neck cut was made 1 cm proximal to the calcar. The acetabulum was sequentially reamed to 51 mm for a 52 mm acetabulum. This was inserted 45 of abduction and 20 of anteversion. The acetabulum was inserted the trial polyethylene liner was placed. The femur was then sequentially broached for a size 2 femur this was trialed and was stable with the +3 neck. The final acetabulum 0 polyethylene liner was placed. The hip was irrigated  with normal saline throughout the case. The femoral stem was inserted and the +3 ball was inserted. This did feel little looser and the leg length was a little short so a +7 ball was then inserted. This was stable the final 7 mm neck and ball was impacted. Hip was placed through full range of motion she had full abduction and flexion to 120 and internal rotation of 70 and the hip was stable. The wound was irrigated with normal saline. The performance short external rotators and capsule were repaired reapproximated with Ethibond suture. Tensor fascia lata was closed using #1 Vicryl. Subcutaneous is closed using 0 Vicryl. Skin was closed using staples. Patient was extubated taken to the PACU in stable condition.

## 2014-06-19 NOTE — Care Management Note (Signed)
Utilization Review completed   Namish Krise,RN, BSN,CCM 

## 2014-06-19 NOTE — Anesthesia Preprocedure Evaluation (Signed)
Anesthesia Evaluation  Patient identified by MRN, date of birth, ID band Patient awake    Reviewed: Allergy & Precautions, NPO status , Patient's Chart, lab work & pertinent test results  Airway Mallampati: II  TM Distance: >3 FB Neck ROM: Full    Dental no notable dental hx.    Pulmonary Current Smoker,  breath sounds clear to auscultation  Pulmonary exam normal       Cardiovascular negative cardio ROS  Rhythm:Regular Rate:Normal     Neuro/Psych negative neurological ROS  negative psych ROS   GI/Hepatic negative GI ROS, Neg liver ROS,   Endo/Other  Hypothyroidism   Renal/GU negative Renal ROS     Musculoskeletal  (+) Arthritis -,   Abdominal   Peds  Hematology negative hematology ROS (+)   Anesthesia Other Findings   Reproductive/Obstetrics negative OB ROS                             Anesthesia Physical Anesthesia Plan  ASA: II  Anesthesia Plan: Spinal   Post-op Pain Management:    Induction:   Airway Management Planned:   Additional Equipment:   Intra-op Plan:   Post-operative Plan:   Informed Consent: I have reviewed the patients History and Physical, chart, labs and discussed the procedure including the risks, benefits and alternatives for the proposed anesthesia with the patient or authorized representative who has indicated his/her understanding and acceptance.   Dental advisory given  Plan Discussed with: CRNA  Anesthesia Plan Comments:         Anesthesia Quick Evaluation

## 2014-06-20 LAB — CBC
HEMATOCRIT: 34.2 % — AB (ref 36.0–46.0)
Hemoglobin: 11.6 g/dL — ABNORMAL LOW (ref 12.0–15.0)
MCH: 33.1 pg (ref 26.0–34.0)
MCHC: 33.9 g/dL (ref 30.0–36.0)
MCV: 97.7 fL (ref 78.0–100.0)
Platelets: 202 10*3/uL (ref 150–400)
RBC: 3.5 MIL/uL — ABNORMAL LOW (ref 3.87–5.11)
RDW: 13.1 % (ref 11.5–15.5)
WBC: 8.1 10*3/uL (ref 4.0–10.5)

## 2014-06-20 LAB — BASIC METABOLIC PANEL
Anion gap: 7 (ref 5–15)
BUN: 12 mg/dL (ref 6–23)
CALCIUM: 8.3 mg/dL — AB (ref 8.4–10.5)
CO2: 27 mmol/L (ref 19–32)
Chloride: 107 mmol/L (ref 96–112)
Creatinine, Ser: 0.7 mg/dL (ref 0.50–1.10)
GFR calc Af Amer: >90 mL/min (ref >90)
GFR calc non Af Amer: 88 mL/min — ABNORMAL LOW (ref >90)
GLUCOSE: 144 mg/dL — AB (ref 70–99)
Potassium: 4.7 mmol/L (ref 3.5–5.1)
Sodium: 141 mmol/L (ref 135–145)

## 2014-06-20 NOTE — Evaluation (Signed)
Physical Therapy Evaluation Patient Details Name: Bianca Collins MRN: [id number] DOB: [birth year] Today's Date: 06/20/2014   History of Present Illness  Pt is a 69 y/o F s/p R THA on 06/19/14.  Pt's PMH includes RC repair (laterality unspecified), hypothyroidism, adenomatous plyp of colon.  Clinical Impression  Pt is s/p R THA resulting in the deficits listed below (see PT Problem List). Pt's pain in R hip is pt's main limiting factor during session today.  Pt refused her pain medicine this morning.  Educated pt on the effects of taking pain medicine as scheduled and pt verbalized understanding.  Pt will benefit from skilled PT to increase their independence and safety with mobility to allow discharge to the venue listed below. PT will continue to follow acutely and will attempt stair training at next session if appropriate.    Follow Up Recommendations Home health PT;Supervision/Assistance - 24 hour    Equipment Recommendations  3in1 (PT)    Recommendations for Other Services       Precautions / Restrictions Precautions Precautions: Posterior Hip;Fall Precaution Booklet Issued: Yes (comment) Precaution Comments: reviewd 3/3 precautions Restrictions Weight Bearing Restrictions: Yes RLE Weight Bearing: Weight bearing as tolerated      Mobility  Bed Mobility Overal bed mobility: Needs Assistance Bed Mobility: Sit to Supine       Sit to supine: Min assist   General bed mobility comments: min assist for bringing BLEs into bed, pt unable to perform leg hook technique 2/2 pain in R hip.  Pt used rails and pushed through LLE to scoot HOB.  Transfers Overall transfer level: Needs assistance Equipment used: Rolling walker (2 wheeled) Transfers: Sit to/from Stand Sit to Stand: Min guard         General transfer comment: Poor control stand>sit, verbal cues to control landing slowly using strength.  Ambulation/Gait Ambulation/Gait assistance: Min guard Ambulation Distance (Feet):  15 Feet Assistive device: Rolling walker (2 wheeled) Gait Pattern/deviations: Step-to pattern;Decreased stride length;Decreased stance time - right;Antalgic;Trunk flexed   Gait velocity interpretation: Below normal speed for age/gender General Gait Details: Pt's ambulatory distance limited 2/2 pain in R hip.  Pt refused pain medicine this morning, pt encouraged to stay on top of pain so that pain does not prevent her from progressing, pt verbalized understanding.   Stairs            Wheelchair Mobility    Modified Rankin (Stroke Patients Only)       Balance Overall balance assessment: Needs assistance Sitting-balance support: Bilateral upper extremity supported;Feet supported Sitting balance-Leahy Scale: Good     Standing balance support: Bilateral upper extremity supported Standing balance-Leahy Scale: Fair                               Pertinent Vitals/Pain Pain Assessment: 0-10 Pain Score: 6  Pain Location: R hip and leg Pain Descriptors / Indicators: Aching;Grimacing;Guarding Pain Intervention(s): Limited activity within patient's tolerance;Monitored during session;Repositioned;Patient requesting pain meds-RN notified;RN gave pain meds during session    Home Living Family/patient expects to be discharged to:: Private residence Living Arrangements: Children;Other relatives (daughter, son in Sports coach, Curator) Available Help at Discharge: Available 24 hours/day;Family Type of Home: House Home Access: Stairs to enter Entrance Stairs-Rails: None Entrance Stairs-Number of Steps: 1 Home Layout: One level;Able to live on main level with bedroom/bathroom Home Equipment: Gilford Rile - 2 wheels;Walker - 4 wheels;Cane - single point;Shower seat - built in Agricultural consultant)  Prior Function Level of Independence: Independent               Hand Dominance   Dominant Hand: Right    Extremity/Trunk Assessment               Lower Extremity Assessment:  RLE deficits/detail RLE Deficits / Details: expected s/p R THA       Communication   Communication: No difficulties  Cognition Arousal/Alertness: Awake/alert Behavior During Therapy: WFL for tasks assessed/performed Overall Cognitive Status: Within Functional Limits for tasks assessed                      General Comments General comments (skin integrity, edema, etc.): Pt's pain in R hip is pt's main limiting factor during session today.  Pt refused her pain medicine this morning.  Educated pt on the effects of taking pain medicine as scheduled and pt verbalized understanding.    Exercises Total Joint Exercises Ankle Circles/Pumps: AROM;Both;10 reps;Supine Quad Sets: AROM;Both;10 reps;Supine Long Arc Quad: AROM;Both;5 reps;Supine      Assessment/Plan    PT Assessment Patient needs continued PT services  PT Diagnosis Generalized weakness;Abnormality of gait;Difficulty walking;Acute pain   PT Problem List Decreased strength;Decreased range of motion;Decreased activity tolerance;Decreased balance;Decreased mobility;Decreased coordination;Decreased safety awareness;Decreased knowledge of use of DME;Decreased knowledge of precautions;Pain  PT Treatment Interventions DME instruction;Gait training;Stair training;Functional mobility training;Therapeutic activities;Therapeutic exercise;Balance training;Neuromuscular re-education;Patient/family education;Modalities   PT Goals (Current goals can be found in the Care Plan section) Acute Rehab PT Goals Patient Stated Goal: none stated PT Goal Formulation: With patient Time For Goal Achievement: 06/27/14 Potential to Achieve Goals: Good    Frequency 7X/week   Barriers to discharge Inaccessible home environment 1 step to enter home    Co-evaluation               End of Session Equipment Utilized During Treatment: Gait belt Activity Tolerance: Patient limited by pain Patient left: in bed;with call bell/phone within reach  (w/ abduction pillow in place) Nurse Communication: Mobility status;Precautions;Weight bearing status         Time: 9357-0177 PT Time Calculation (min) (ACUTE ONLY): 29 min   Charges:   PT Evaluation $Initial PT Evaluation Tier I: 1 Procedure PT Treatments $Gait Training: 8-22 mins   PT G CodesJoslyn Hy PT, [place] [phone]  [phone] 06/20/2014, 11:32 AM

## 2014-06-20 NOTE — Progress Notes (Signed)
Physical Therapy Treatment Note  Clinical Impression: Pt demonstrated improved activity tolerance this afternoon, with min assist level function.  Will assess stairs tomorrow.  Current plan to d/c home with intermittent supervision and home health PT remains appropriate once stairs are assessed and per medical clearance and d/c.   06/20/14 1645  PT Visit Information  Last PT Received On 06/20/14  Assistance Needed +1  History of Present Illness Pt is a 69 y/o F s/p R THA on 06/19/14.  Pt's PMH includes RC repair (laterality unspecified), hypothyroidism, adenomatous plyp of colon.  PT Time Calculation  PT Start Time (ACUTE ONLY) 1645  PT Stop Time (ACUTE ONLY) 1657  PT Time Calculation (min) (ACUTE ONLY) 12 min  Subjective Data  Patient Stated Goal to get going  Precautions  Precautions Posterior Hip;Fall  Precaution Booklet Issued Yes (comment)  Precaution Comments Able to recall 3/3 precautions  Restrictions  Weight Bearing Restrictions Yes  RLE Weight Bearing WBAT  Pain Assessment  Pain Assessment 0-10  Pain Score 3  Pain Location R hip  Pain Descriptors / Indicators Sore  Pain Intervention(s) Limited activity within patient's tolerance  Cognition  Arousal/Alertness Awake/alert  Behavior During Therapy WFL for tasks assessed/performed  Overall Cognitive Status Within Functional Limits for tasks assessed  Bed Mobility  Overal bed mobility Needs Assistance  Bed Mobility Sit to Supine  Sit to supine Min assist  General bed mobility comments Still needed min assist to navigate RLE  Transfers  Overall transfer level Needs assistance  Equipment used Rolling walker (2 wheeled)  Transfers Sit to/from Stand  Sit to Stand Min guard  General transfer comment Poor control stand>sit, verbal cues to control landing slowly using strength.  Ambulation/Gait  Ambulation/Gait assistance Min guard  Ambulation Distance (Feet) 55 Feet  Assistive device Rolling walker (2 wheeled)  Gait  Pattern/deviations Step-to pattern;Decreased stride length;Antalgic  Gait velocity Slow  Gait velocity interpretation Below normal speed for age/gender  General Gait Details Gait still extremely antalgic with step to gait pattern, but able to ambulate a greater distance than this AM  Balance  Overall balance assessment Needs assistance  Standing balance support Bilateral upper extremity supported;During functional activity  Standing balance-Leahy Scale Fair  Standing balance comment Heavy reliance on B UE during ambulation  General Comments  General comments (skin integrity, edema, etc.) Still painful, but improved activity tolerance   PT - End of Session  Equipment Utilized During Treatment Gait belt  Activity Tolerance Patient tolerated treatment well  Patient left in chair;with call bell/phone within reach  Nurse Communication Mobility status  PT - Assessment/Plan  PT Plan Current plan remains appropriate  PT Frequency (ACUTE ONLY) 7X/week  Follow Up Recommendations Home health PT;Supervision/Assistance - 24 hour  PT equipment 3in1 (PT)  PT Goal Progression  Progress towards PT goals Progressing toward goals   Lucas Mallow, SPT (student physical therapist) Office phone: [phone]

## 2014-06-20 NOTE — Plan of Care (Signed)
Problem: Consults Goal: Diagnosis- Total Joint Replacement Primary Total Hip Right     

## 2014-06-20 NOTE — Care Management Note (Addendum)
CARE MANAGEMENT NOTE 06/20/2014  Patient:  Bianca Collins, Bianca Collins   Account Number:  [id number]  Date Initiated:  06/20/2014  Documentation initiated by:  Ricki Miller  Subjective/Objective Assessment:   69 yr old female admitted with osteoarthritis of right hip. Patient underwent a right total hip arthroplasty.     Action/Plan:   CM spoke with patient concerning home health and DME needs. Patient preoperatively setup with [facility], no changes.. Patient has Rolling walker. Has family support at discharge.   Anticipated DC Date:  06/21/2014   Anticipated DC Plan:  [place]  CM consult      PAC Choice  [place]   Choice offered to / List presented to:  C-1 Patient   DME arranged  3-N-1      DME agency  [employer]     [place] arranged  HH-2 PT      [place]   Status of service:  Completed, signed off Medicare Important Message given?  NA - LOS <3 / Initial given by admissions (If response is "NO", the following Medicare IM given date fields will be blank) Date Medicare IM given:   Medicare IM given by:   Date Additional Medicare IM given:   Additional Medicare IM given by:    Discharge Disposition:  [place]  Per UR Regulation:  Reviewed for med. necessity/level of care/duration of stay  If discussed at [place] of Stay Meetings, dates discussed:    Comments:

## 2014-06-20 NOTE — Progress Notes (Signed)
Patient ID: Bianca Collins, female   DOB: November 26, 1945, 69 y.o.   MRN: [id number] Postoperative day 1 right total hip arthroplasty. Patient is comfortable this morning. CT scan was negative for pulmonary nodules. Plan for discharge Friday or Saturday.

## 2014-06-20 NOTE — Progress Notes (Signed)
Occupational Therapy Evaluation Patient Details Name: Bianca Collins MRN: [id number] DOB: Sep 12, 1945 Today's Date: 06/20/2014    History of Present Illness Pt is a 69 y/o F s/p R THA on 06/19/14.  Pt's PMH includes RC repair (laterality unspecified), hypothyroidism, adenomatous plyp of colon.   Clinical Impression   PTA, pt independent with ADL and mobility. Began education regarding compensatory techniques and posterior hip precautions for ADL. Pt making good progress. Will follow acutley to address goals and facilitate D/C home with intermittent S.     Follow Up Recommendations  No OT follow up;Supervision - Intermittent    Equipment Recommendations  3 in 1 bedside comode;Tub/shower bench (will further assess need for bench)    Recommendations for Other Services       Precautions / Restrictions Precautions Precautions: Posterior Hip;Fall Precaution Booklet Issued: Yes (comment) Precaution Comments: only able to recall 1/3 precautions` Restrictions Weight Bearing Restrictions: Yes RLE Weight Bearing: Weight bearing as tolerated      Mobility Bed Mobility Overal bed mobility: Needs Assistance Bed Mobility: Sit to Supine       Sit to supine: Min assist   General bed mobility comments: Min a to lift RLE onto bed  Transfers Overall transfer level: Needs assistance Equipment used: Rolling walker (2 wheeled) Transfers: Sit to/from Stand Sit to Stand: Min guard              Balance     Sitting balance-Leahy Scale: Good       Standing balance-Leahy Scale: Fair                              ADL Overall ADL's : Needs assistance/impaired     Grooming: Set up   Upper Body Bathing: Set up   Lower Body Bathing: Maximal assistance;Sit to/from stand   Upper Body Dressing : Set up   Lower Body Dressing: Maximal assistance   Toilet Transfer: Minimal assistance   Toileting- Clothing Manipulation and Hygiene: Minimal assistance     Tub/Shower  Transfer Details (indicate cue type and reason): discussed options for tub transfer Functional mobility during ADLs: Minimal assistance;Rolling walker;Cueing for sequencing;Cueing for safety General ADL Comments: Began education regarding ADL and posterior hip precautions. will demo hip kit tomorrow.                     Pertinent Vitals/Pain Pain Assessment: 0-10 Pain Score: 1  Pain Location: r hip Pain Descriptors / Indicators: Aching Pain Intervention(s): Limited activity within patient's tolerance;Monitored during session;Repositioned     Hand Dominance Right   Extremity/Trunk Assessment Upper Extremity Assessment Upper Extremity Assessment: Overall WFL for tasks assessed   Lower Extremity Assessment Lower Extremity Assessment: Defer to PT evaluation   Cervical / Trunk Assessment Cervical / Trunk Assessment: Normal   Communication Communication Communication: No difficulties   Cognition Arousal/Alertness: Awake/alert Behavior During Therapy: WFL for tasks assessed/performed Overall Cognitive Status: Within Functional Limits for tasks assessed                     General Comments   Pt very appreciative of help                 Home Living Family/patient expects to be discharged to:: Private residence Living Arrangements: Children;Other relatives (daughter, son in Sports coach, Curator) Available Help at Discharge: Available 24 hours/day;Family Type of Home: House Home Access: Stairs to enter [employer] of Steps: 1 Entrance Stairs-Rails:  None Home Layout: One level;Able to live on main level with bedroom/bathroom     Bathroom Shower/Tub: Tub/shower unit;Curtain Shower/tub characteristics: Architectural technologist: Standard Bathroom Accessibility: Yes How Accessible: Accessible via walker;Accessible via wheelchair Home Equipment: Gilford Rile - 2 wheels;Walker - 4 wheels;Cane - single point;Shower seat - built in Agricultural consultant)          Prior  Functioning/Environment Level of Independence: Independent             OT Diagnosis: Generalized weakness;Acute pain   OT Problem List: Decreased strength;Decreased range of motion;Decreased activity tolerance;Impaired balance (sitting and/or standing);Decreased safety awareness;Decreased knowledge of use of DME or AE;Decreased knowledge of precautions;Pain   OT Treatment/Interventions: Self-care/ADL training;DME and/or AE instruction;Therapeutic activities;Patient/family education    OT Goals(Current goals can be found in the care plan section) Acute Rehab OT Goals Patient Stated Goal: to get going OT Goal Formulation: With patient Time For Goal Achievement: 06/27/14 Potential to Achieve Goals: Good  OT Frequency: Min 2X/week   Barriers to D/C:            Co-evaluation   Bianca Collins, OTR/L  [phone] 06-27-14            End of Session Equipment Utilized During Treatment: Rolling walker Nurse Communication: Mobility status;Precautions  Activity Tolerance: Patient tolerated treatment well Patient left: in bed;with call bell/phone within reach   Time: 1543-1605 OT Time Calculation (min): 22 min Charges:  OT General Charges $OT Visit: 1 Procedure OT Evaluation $Initial OT Evaluation Tier I: 1 Procedure G-Codes:    Bianca Collins,Bianca Collins 2014-06-27, 4:24 PM

## 2014-06-21 LAB — BASIC METABOLIC PANEL
Anion gap: 5 (ref 5–15)
BUN: 9 mg/dL (ref 6–23)
CALCIUM: 8.2 mg/dL — AB (ref 8.4–10.5)
CO2: 28 mmol/L (ref 19–32)
Chloride: 103 mmol/L (ref 96–112)
Creatinine, Ser: 0.72 mg/dL (ref 0.50–1.10)
GFR calc Af Amer: >90 mL/min (ref >90)
GFR calc non Af Amer: 88 mL/min — ABNORMAL LOW (ref >90)
GLUCOSE: 124 mg/dL — AB (ref 70–99)
Potassium: 4.1 mmol/L (ref 3.5–5.1)
Sodium: 136 mmol/L (ref 135–145)

## 2014-06-21 LAB — CBC
HEMATOCRIT: 32.1 % — AB (ref 36.0–46.0)
Hemoglobin: 10.4 g/dL — ABNORMAL LOW (ref 12.0–15.0)
MCH: 32 pg (ref 26.0–34.0)
MCHC: 32.4 g/dL (ref 30.0–36.0)
MCV: 98.8 fL (ref 78.0–100.0)
Platelets: 175 10*3/uL (ref 150–400)
RBC: 3.25 MIL/uL — ABNORMAL LOW (ref 3.87–5.11)
RDW: 13.3 % (ref 11.5–15.5)
WBC: 10.3 10*3/uL (ref 4.0–10.5)

## 2014-06-21 MED ORDER — OXYCODONE-ACETAMINOPHEN 5-325 MG PO TABS: 1.0000 | ORAL_TABLET | ORAL | Status: DC | PRN

## 2014-06-21 MED ORDER — ASPIRIN EC 325 MG PO TBEC: 325.0000 mg | DELAYED_RELEASE_TABLET | Freq: Every day | ORAL | Status: DC

## 2014-06-21 NOTE — Progress Notes (Signed)
Patient ID: Bianca Collins, female   DOB: 10-07-1945, 69 y.o.   MRN: [id number] Patient progressing slowly with therapy. Plan for discharge to home on Saturday. Prescription on the chart

## 2014-06-21 NOTE — Discharge Summary (Signed)
Physician Discharge Summary  Patient ID: Bianca Collins MRN: [id number] DOB/AGE: [birth year] 67 y.o.  Admit date: 06/19/2014 Discharge date: 06/21/2014  Admission Diagnoses: Osteoarthritis left hip  Discharge Diagnoses:  Active Problems:   H/O total hip arthroplasty   Discharged Condition: stable  Hospital Course: Patient's hospital course was essentially unremarkable. She underwent total hip arthroplasty. Postoperatively she progressed well and was discharged to home in stable condition.  Consults: None  Significant Diagnostic Studies: labs: Routine labs  Treatments: surgery: See operative note  Discharge Exam: Blood pressure 124/65, pulse 78, temperature 98.4 F (36.9 C), temperature source Oral, resp. rate 16, weight 81.647 kg (180 lb), SpO2 92 %. Incision/Wound: clean and dry  Disposition:   Discharge Instructions    Call MD / Call 911    Complete by:  As directed   If you experience chest pain or shortness of breath, CALL 911 and be transported to the hospital emergency room.  If you develope a fever above 101 F, pus (white drainage) or increased drainage or redness at the wound, or calf pain, call your surgeon's office.     Constipation Prevention    Complete by:  As directed   Drink plenty of fluids.  Prune juice may be helpful.  You may use a stool softener, such as Colace (over the counter) 100 mg twice a day.  Use MiraLax (over the counter) for constipation as needed.     Diet - low sodium heart healthy    Complete by:  As directed      Increase activity slowly as tolerated    Complete by:  As directed      Weight bearing as tolerated    Complete by:  As directed             Medication List    TAKE these medications        aspirin EC 325 MG tablet  Take 1 tablet (325 mg total) by mouth daily.     estradiol 1 MG tablet  Commonly known as:  ESTRACE  Take 1 mg by mouth daily.     levothyroxine 125 MCG tablet  Commonly known as:  SYNTHROID, LEVOTHROID  Take  1 tablet (125 mcg total) by mouth daily before breakfast.     multivitamin tablet  Take 1 tablet by mouth daily.     oxyCODONE-acetaminophen 5-325 MG per tablet  Commonly known as:  ROXICET  Take 1 tablet by mouth every 4 (four) hours as needed for severe pain.     VITAMIN C PO  Take 1 tablet by mouth 3 (three) times a week.           Follow-up Information    Follow up with [facility].   Why:  Someone from [facility] wil contact you concerning start date and time for therapy.   Contact information:   [address] [place] [address] [phone]       Follow up with DUDA,MARCUS V, MD In 2 weeks.   Specialty:  Orthopedic Surgery   Contact information:   [place] [id number] [phone]       Signed: Newt Minion 06/21/2014, 6:44 AM

## 2014-06-21 NOTE — Progress Notes (Signed)
Physical Therapy Treatment Patient Details Name: Bianca Collins MRN: [id number] DOB: 05-27-1945 Today's Date: 06/21/2014    History of Present Illness Pt is a 69 y/o F s/p R THA on 06/19/14.  Pt's PMH includes RC repair (laterality unspecified), hypothyroidism, adenomatous plyp of colon.    PT Comments    Pt asks to sit down after ambulating 45 ft this session 2/2 fatigue and pain in R hip.  Suggesting chair follow at next session.  PT navigated 1 step x2 using RW w/o rails.  Pt w/ sig decreased stance time on RLE despite verbal cues to weight bear as much as tolerated.  Pt reports she will have assistance 24/7 except 1.5 hrs at the most when her daughter drops off her grandaughter at school.  Pt is anticipating d/c to home w/ HHPT tomorrow.   Follow Up Recommendations  Home health PT;Supervision/Assistance - 24 hour     Equipment Recommendations  3in1 (PT)    Recommendations for Other Services       Precautions / Restrictions Precautions Precautions: Posterior Hip;Fall Precaution Comments: Able to recall 2/3 precautions Restrictions Weight Bearing Restrictions: Yes RLE Weight Bearing: Weight bearing as tolerated    Mobility  Bed Mobility Overal bed mobility: Needs Assistance Bed Mobility: Supine to Sit     Supine to sit: Min assist     General bed mobility comments: Pt used handrails.  Still needed min assist to navigate RLE. Bed pad used to assist pt sitting EOB  Transfers Overall transfer level: Needs assistance Equipment used: Rolling walker (2 wheeled) Transfers: Sit to/from Stand Sit to Stand: Min guard         General transfer comment: 2 attempts before pt w/ successful sit>stand   Ambulation/Gait Ambulation/Gait assistance: Min guard Ambulation Distance (Feet): 45 Feet Assistive device: Rolling walker (2 wheeled) Gait Pattern/deviations: Step-to pattern;Antalgic;Trunk flexed;Decreased stance time - right;Decreased stride length Gait velocity: Slow Gait  velocity interpretation: Below normal speed for age/gender General Gait Details: Extremely dec stance time on RLE, pt seems hesitant to place weight on RLE despite verbal cues and pt assure her pain is 4/10.     Stairs Stairs: Yes Stairs assistance: Min guard Stair Management: No rails;Backwards;With walker Number of Stairs: 1 (x2) General stair comments: Pt reports she has one step, a platform, and then another step up to get into the house.  Pt verbalized and demonstrated understanding of how to navigate step using RW.  Wheelchair Mobility    Modified Rankin (Stroke Patients Only)       Balance Overall balance assessment: Needs assistance Sitting-balance support: No upper extremity supported;Feet supported Sitting balance-Leahy Scale: Fair Sitting balance - Comments: Pt has tendency to lean to her L to offweight the R hip 2/2 pain Postural control: Left lateral lean Standing balance support: Bilateral upper extremity supported Standing balance-Leahy Scale: Fair                      Cognition Arousal/Alertness: Awake/alert Behavior During Therapy: WFL for tasks assessed/performed Overall Cognitive Status: Within Functional Limits for tasks assessed                      Exercises Total Joint Exercises Ankle Circles/Pumps: AROM;Both;10 reps;Supine Quad Sets: AROM;Both;5 reps;Supine Hip ABduction/ADduction: AROM;AAROM;Right;5 reps;Supine Long Arc Quad: AROM;Both;5 reps;Seated    General Comments        Pertinent Vitals/Pain Pain Assessment: 0-10 Pain Score: 4  Pain Location: R hip Pain Descriptors / Indicators: Nagging;Grimacing;Guarding Pain Intervention(s):  Limited activity within patient's tolerance;Monitored during session;Repositioned    Home Living                      Prior Function            PT Goals (current goals can now be found in the care plan section) Acute Rehab PT Goals Patient Stated Goal: to get going PT Goal  Formulation: With patient Time For Goal Achievement: 06/27/14 Potential to Achieve Goals: Good Progress towards PT goals: Progressing toward goals    Frequency  7X/week    PT Plan Current plan remains appropriate    Co-evaluation             End of Session Equipment Utilized During Treatment: Gait belt Activity Tolerance: Patient limited by fatigue Patient left: in chair;with call bell/phone within reach     Time: 1223-1257 PT Time Calculation (min) (ACUTE ONLY): 34 min  Charges:  $Gait Training: 23-37 mins                    G CodesJoslyn Hy PT, [place] [phone]  [phone] 06/21/2014, 1:36 PM

## 2014-06-21 NOTE — Progress Notes (Addendum)
Physical Therapy Treatment Patient Details Name: Bianca Collins MRN: [id number] DOB: 1945/03/28 Today's Date: 06/21/2014    History of Present Illness Pt is a 69 y/o F s/p R THA on 06/19/14.  Pt's PMH includes RC repair (laterality unspecified), hypothyroidism, adenomatous plyp of colon.    PT Comments    Pt increased ambulatory distance this session and was able to demonstrate R heel strike and toe off following verbal cues.  Pt required min guard assist during ambulation.  Pt is encouraged when a goal is set and it is met.  Pt is anticipating d/c to home w/ HHPT tomorrow.   Follow Up Recommendations  Home health PT;Supervision/Assistance - 24 hour     Equipment Recommendations  3in1 (PT)    Recommendations for Other Services       Precautions / Restrictions Precautions Precautions: Posterior Hip;Fall Precaution Comments: reviewed 3/3 hip precautions Restrictions Weight Bearing Restrictions: Yes RLE Weight Bearing: Weight bearing as tolerated    Mobility  Bed Mobility Overal bed mobility: Needs Assistance Bed Mobility: Supine to Sit     Supine to sit: Min assist     General bed mobility comments: assistance w/ moving RLE OOB. verbal cues for proper hand positioning and sequencing to sit EOB  Transfers Overall transfer level: Needs assistance Equipment used: Rolling walker (2 wheeled) Transfers: Sit to/from Stand Sit to Stand: Min guard         General transfer comment: 2 tries before sit>stand completed likely 2/2 pt's hesitancy to put weight of RLE 2/2 fear of pain  Ambulation/Gait Ambulation/Gait assistance: Min guard Ambulation Distance (Feet): 60 Feet Assistive device: Rolling walker (2 wheeled) Gait Pattern/deviations: Step-to pattern;Decreased stance time - right;Decreased stride length;Antalgic Gait velocity: Slow Gait velocity interpretation: Below normal speed for age/gender General Gait Details: Pt w/ improved gait speed from earlier this afternoon.   Verbal cues for heel strike and toe off w/ pt able to demonstrate understanding.   Stairs Stairs: Yes Stairs assistance: Min guard Stair Management: No rails;Backwards;With walker Number of Stairs: 1 (x2) General stair comments: Pt reports she has one step, a platform, and then another step up to get into the house.  Pt verbalized and demonstrated understanding of how to navigate step using RW.  Wheelchair Mobility    Modified Rankin (Stroke Patients Only)       Balance Overall balance assessment: Needs assistance Sitting-balance support: Feet supported;Bilateral upper extremity supported Sitting balance-Leahy Scale: Fair Sitting balance - Comments: Pt has tendency to lean to her L to offweight the R hip 2/2 pain Postural control: Left lateral lean Standing balance support: Bilateral upper extremity supported Standing balance-Leahy Scale: Fair                      Cognition Arousal/Alertness: Awake/alert Behavior During Therapy: WFL for tasks assessed/performed Overall Cognitive Status: Within Functional Limits for tasks assessed                      Exercises Total Joint Exercises Ankle Circles/Pumps: AROM;Both;10 reps;Seated Quad Sets: AROM;Both;5 reps;Supine Hip ABduction/ADduction: AROM;AAROM;Right;5 reps;Supine Long Arc Quad: AROM;Both;5 reps;Seated    General Comments        Pertinent Vitals/Pain Pain Assessment: 0-10 Pain Score: 1  Pain Location: R hip Pain Descriptors / Indicators: Cramping;Dull Pain Intervention(s): Limited activity within patient's tolerance;Monitored during session;Repositioned;Ice applied    Home Living  Prior Function            PT Goals (current goals can now be found in the care plan section) Acute Rehab PT Goals Patient Stated Goal: to get going PT Goal Formulation: With patient Time For Goal Achievement: 06/27/14 Potential to Achieve Goals: Good Progress towards PT goals:  Progressing toward goals    Frequency  7X/week    PT Plan Current plan remains appropriate    Co-evaluation             End of Session Equipment Utilized During Treatment: Gait belt Activity Tolerance: Patient limited by fatigue;Patient tolerated treatment well Patient left: in chair;with call bell/phone within reach (w/ ice pack on R hip)     Time: 1855-0158 PT Time Calculation (min) (ACUTE ONLY): 20 min  Charges:  $Gait Training: 8-22 mins                    G CodesJoslyn Hy PT, [place] [phone] 06/21/2014, 4:50 PM

## 2014-06-22 LAB — BASIC METABOLIC PANEL
Anion gap: 4 — ABNORMAL LOW (ref 5–15)
BUN: 10 mg/dL (ref 6–23)
CO2: 29 mmol/L (ref 19–32)
CREATININE: 0.78 mg/dL (ref 0.50–1.10)
Calcium: 8.4 mg/dL (ref 8.4–10.5)
Chloride: 103 mmol/L (ref 96–112)
GFR, EST NON AFRICAN AMERICAN: 85 mL/min — AB (ref >90)
Glucose, Bld: 109 mg/dL — ABNORMAL HIGH (ref 70–99)
Potassium: 4.1 mmol/L (ref 3.5–5.1)
SODIUM: 136 mmol/L (ref 135–145)

## 2014-06-22 LAB — CBC
HCT: 30 % — ABNORMAL LOW (ref 36.0–46.0)
HEMOGLOBIN: 9.8 g/dL — AB (ref 12.0–15.0)
MCH: 32.1 pg (ref 26.0–34.0)
MCHC: 32.7 g/dL (ref 30.0–36.0)
MCV: 98.4 fL (ref 78.0–100.0)
PLATELETS: 196 10*3/uL (ref 150–400)
RBC: 3.05 MIL/uL — AB (ref 3.87–5.11)
RDW: 13.3 % (ref 11.5–15.5)
WBC: 10.8 10*3/uL — ABNORMAL HIGH (ref 4.0–10.5)

## 2014-06-22 NOTE — Progress Notes (Signed)
Physical Therapy Treatment Patient Details Name: Bianca Collins MRN: [id number] DOB: [birth year] Today's Date: 01-Jul-2014    History of Present Illness Pt is a 70 y/o F s/p R THA on 06/19/14.  Pt's PMH includes RC repair (laterality unspecified), hypothyroidism, adenomatous plyp of colon.    PT Comments    Pt making great progress toward goals. Safe to discharge home with family assistance at current mobility level.  Follow Up Recommendations  Home health PT;Supervision/Assistance - 24 hour     Equipment Recommendations  3in1 (PT)       Precautions / Restrictions Precautions Precaution Comments: reviewed 3/3 hip precautions Restrictions Weight Bearing Restrictions: Yes RLE Weight Bearing: Weight bearing as tolerated    Mobility  Bed Mobility         Supine to sit: Min guard     General bed mobility comments: bed flat and no rails used. pt educated on how to use belt/sheet to move right leg to and off edge of bed. cues on sequence and use of arms with sititng up and scooting to edge of bed. min assist needed to complete scoot to edge of bed due to dip in mattress limited pt's progress  Transfers     Transfers: Sit to/from Stand Sit to Stand: Min guard         General transfer comment: cues on right foot placement and on hand placement with transfers.  Ambulation/Gait Ambulation/Gait assistance: Min guard;Supervision Ambulation Distance (Feet): 15 Feet (x 2 reps) Assistive device: Rolling walker (2 wheeled) Gait Pattern/deviations: Step-to pattern;Step-through pattern;Decreased stride length;Antalgic Gait velocity: decreased Gait velocity interpretation: Below normal speed for age/gender General Gait Details: cues on posture and to increase step length with bil legs. cues for increased foot clearance on right with increased hip/knee flexion during swing phase.   Stairs   Stairs assistance: Min assist (for walker stability only) Stair Management: Step to  pattern;Backwards;With walker Number of Stairs: 1 General stair comments: pt able to demo technique from yesterday's sessions independently and safely.         Cognition Arousal/Alertness: Awake/alert Behavior During Therapy: WFL for tasks assessed/performed Overall Cognitive Status: Within Functional Limits for tasks assessed         Exercises Total Joint Exercises Ankle Circles/Pumps: AROM;Both;10 reps;Supine Quad Sets: AROM;Strengthening;Right;10 reps;Supine Short Arc Quad: AROM;Strengthening;Right;10 reps;Supine Heel Slides: AAROM;Strengthening;Right;10 reps;Supine     Pertinent Vitals/Pain Pain Score: 5  Pain Location: right hip Pain Descriptors / Indicators: Sore;Aching Pain Intervention(s): Monitored during session;Repositioned;Ice applied (pt deferred pain meds at this time)     PT Goals (current goals can now be found in the care plan section) Acute Rehab PT Goals Patient Stated Goal: to get going PT Goal Formulation: With patient Time For Goal Achievement: 06/27/14 Potential to Achieve Goals: Good Progress towards PT goals: Progressing toward goals    Frequency  7X/week    PT Plan Current plan remains appropriate       End of Session Equipment Utilized During Treatment: Gait belt Activity Tolerance: Patient tolerated treatment well Patient left: in chair;with call bell/phone within reach;with nursing/sitter in room     Time: 0918-0950 PT Time Calculation (min) (ACUTE ONLY): 32 min  Charges:  $Gait Training: 8-22 mins $Therapeutic Exercise: 8-22 mins                    G Codes:      Willow Ora 07/01/14, 10:29 AM   Willow Ora, PTA, [place] [address]  [place], [place] [address] [phone] 06/22/2014, 10:30 AM

## 2014-06-22 NOTE — Discharge Instructions (Signed)

## 2014-06-22 NOTE — Progress Notes (Signed)
Went over discharge instructions, gave pt any prescriptions that were ordered. No IV access to remove, pt worked with PT and was cleared for d/c. Dressing to the RLE was c/d/i.  Pt to d/c home via family member.

## 2014-06-22 NOTE — Progress Notes (Signed)
   06/22/14 1100  OT Visit Information  Last OT Received On 06/22/14  Assistance Needed +1  History of Present Illness Pt is a 69 y/o F s/p R THA on 06/19/14.  Pt's PMH includes RC repair (laterality unspecified), hypothyroidism, adenomatous plyp of colon.  OT Time Calculation  OT Start Time (ACUTE ONLY) 1048  OT Stop Time (ACUTE ONLY) 1104  OT Time Calculation (min) 16 min  Precautions  Precautions Posterior Hip;Fall  Precaution Comments reviewed precautions with pt and her family  Pain Assessment  Pain Assessment Faces  Faces Pain Scale 6  Pain Location R hip  Pain Descriptors / Indicators Sore  Pain Intervention(s) Monitored during session;Ice applied  Cognition  Arousal/Alertness Awake/alert  Behavior During Therapy WFL for tasks assessed/performed  Overall Cognitive Status Within Functional Limits for tasks assessed  ADL  General ADL Comments Educated pt's husband and daughter in use of AE for LB ADL, multiple uses of 3 in1 and tub transfer bench including arranging shower curtain to prevent water on floor.  Balance  Overall balance assessment Needs assistance  Sitting-balance support Feet supported  Sitting balance-Leahy Scale Fair  Sitting balance - Comments Pt has tendency to lean to her L to offweight the R hip 2/2 pain  Standing balance-Leahy Scale Fair  Restrictions  RLE Weight Bearing WBAT  Transfers  Overall transfer level Needs assistance  Equipment used Rolling walker (2 wheeled)  Transfers Sit to/from Stand  Sit to Stand Min assist  General transfer comment cues for R LE placement and assist to rise   OT - End of Session  Equipment Utilized During Treatment Rolling walker  Activity Tolerance No increased pain  Patient left Other (comment) (in w/c for discharge home)  OT Assessment/Plan  OT Plan Discharge plan remains appropriate  Follow Up Recommendations No OT follow up;Supervision - Intermittent  OT Equipment 3 in 1 bedside comode;Tub/shower bench  OT  General Charges  $OT Visit 1 Procedure  OT Treatments  $Self Care/Home Management  8-22 mins  06/22/2014 Nestor Lewandowsky, OTR/L Pager: [phone]

## 2014-06-22 NOTE — Progress Notes (Signed)
Dressing c/d/i. PT today.  Stable from ortho standpoint.  Should be able to dc home after PT today.  Rx in chart.  Azucena Cecil, MD [place] 7:57 AM

## 2014-06-25 ENCOUNTER — Encounter (HOSPITAL_COMMUNITY): Payer: Self-pay | Admitting: Orthopedic Surgery

## 2014-06-28 ENCOUNTER — Encounter (HOSPITAL_COMMUNITY): Payer: Self-pay | Admitting: Orthopedic Surgery

## 2014-07-02 ENCOUNTER — Other Ambulatory Visit (HOSPITAL_COMMUNITY): Payer: Self-pay | Admitting: Orthopedic Surgery

## 2014-07-02 ENCOUNTER — Inpatient Hospital Stay (HOSPITAL_COMMUNITY)
Admission: AD | Admit: 2014-07-02 | Discharge: 2014-07-06 | DRG: 467 | Disposition: A | Discharge status: Home / Self Care | Payer: Medicare HMO | Source: Ambulatory Visit | Attending: Orthopedic Surgery | Admitting: Orthopedic Surgery

## 2014-07-02 ENCOUNTER — Ambulatory Visit (HOSPITAL_COMMUNITY): Payer: Medicare HMO

## 2014-07-02 ENCOUNTER — Ambulatory Visit (HOSPITAL_COMMUNITY): Payer: Medicare HMO | Admitting: Certified Registered Nurse Anesthetist

## 2014-07-02 ENCOUNTER — Encounter (HOSPITAL_COMMUNITY): Payer: Self-pay | Admitting: *Deleted

## 2014-07-02 ENCOUNTER — Encounter (HOSPITAL_COMMUNITY)
Admission: AD | Disposition: A | Discharge status: Home / Self Care | Payer: Self-pay | Source: Ambulatory Visit | Attending: Orthopedic Surgery

## 2014-07-02 DIAGNOSIS — Z86010 Personal history of colonic polyps: Secondary | ICD-10-CM

## 2014-07-02 DIAGNOSIS — T84040S Periprosthetic fracture around internal prosthetic right hip joint, sequela: Secondary | ICD-10-CM

## 2014-07-02 DIAGNOSIS — E039 Hypothyroidism, unspecified: Secondary | ICD-10-CM | POA: Diagnosis present

## 2014-07-02 DIAGNOSIS — T84040A Periprosthetic fracture around internal prosthetic right hip joint, initial encounter: Principal | ICD-10-CM | POA: Diagnosis present

## 2014-07-02 DIAGNOSIS — M1990 Unspecified osteoarthritis, unspecified site: Secondary | ICD-10-CM | POA: Diagnosis present

## 2014-07-02 DIAGNOSIS — Z90710 Acquired absence of both cervix and uterus: Secondary | ICD-10-CM

## 2014-07-02 DIAGNOSIS — M9701XA Periprosthetic fracture around internal prosthetic right hip joint, initial encounter: Secondary | ICD-10-CM

## 2014-07-02 DIAGNOSIS — D62 Acute posthemorrhagic anemia: Secondary | ICD-10-CM | POA: Diagnosis not present

## 2014-07-02 DIAGNOSIS — Z7982 Long term (current) use of aspirin: Secondary | ICD-10-CM

## 2014-07-02 DIAGNOSIS — F17210 Nicotine dependence, cigarettes, uncomplicated: Secondary | ICD-10-CM | POA: Diagnosis present

## 2014-07-02 DIAGNOSIS — Y831 Surgical operation with implant of artificial internal device as the cause of abnormal reaction of the patient, or of later complication, without mention of misadventure at the time of the procedure: Secondary | ICD-10-CM | POA: Diagnosis present

## 2014-07-02 HISTORY — PX: ORIF PERIPROSTHETIC FRACTURE: SHX5034

## 2014-07-02 LAB — CBC
HEMATOCRIT: 35.6 % — AB (ref 36.0–46.0)
Hemoglobin: 11.5 g/dL — ABNORMAL LOW (ref 12.0–15.0)
MCH: 32.2 pg (ref 26.0–34.0)
MCHC: 32.3 g/dL (ref 30.0–36.0)
MCV: 99.7 fL (ref 78.0–100.0)
Platelets: 457 10*3/uL — ABNORMAL HIGH (ref 150–400)
RBC: 3.57 MIL/uL — ABNORMAL LOW (ref 3.87–5.11)
RDW: 14 % (ref 11.5–15.5)
WBC: 12.9 10*3/uL — ABNORMAL HIGH (ref 4.0–10.5)

## 2014-07-02 LAB — ABO/RH: ABO/RH(D): O POS

## 2014-07-02 SURGERY — OPEN REDUCTION INTERNAL FIXATION (ORIF) PERIPROSTHETIC FRACTURE
Anesthesia: General | Site: Hip | Laterality: Right

## 2014-07-02 MED ORDER — 0.9 % SODIUM CHLORIDE (POUR BTL) OPTIME
TOPICAL | Status: DC | PRN
  Administered 2014-07-02: 1000 mL

## 2014-07-02 MED ORDER — MIDAZOLAM HCL 5 MG/5ML IJ SOLN
INTRAMUSCULAR | Status: DC | PRN
  Administered 2014-07-02: 2 mg via INTRAVENOUS

## 2014-07-02 MED ORDER — LEVOTHYROXINE SODIUM 125 MCG PO TABS
125.0000 ug | ORAL_TABLET | Freq: Every day | ORAL | Status: DC
  Administered 2014-07-03 – 2014-07-06 (×4): 125 ug via ORAL
  Filled 2014-07-02 (×4): qty 1

## 2014-07-02 MED ORDER — GENTAMICIN SULFATE 40 MG/ML IJ SOLN
INTRAMUSCULAR | Status: AC
  Filled 2014-07-02: qty 6

## 2014-07-02 MED ORDER — CEFAZOLIN SODIUM-DEXTROSE 2-3 GM-% IV SOLR: INTRAVENOUS | Status: DC | PRN

## 2014-07-02 MED ORDER — ESTRADIOL 1 MG PO TABS
1.0000 mg | ORAL_TABLET | Freq: Every day | ORAL | Status: DC
  Administered 2014-07-03 – 2014-07-05 (×3): 1 mg via ORAL
  Filled 2014-07-02 (×5): qty 1

## 2014-07-02 MED ORDER — ONDANSETRON HCL 4 MG/2ML IJ SOLN
INTRAMUSCULAR | Status: AC
  Filled 2014-07-02: qty 2

## 2014-07-02 MED ORDER — PHENOL 1.4 % MT LIQD: 1.0000 | OROMUCOSAL | Status: DC | PRN

## 2014-07-02 MED ORDER — VANCOMYCIN HCL 1000 MG IV SOLR
INTRAVENOUS | Status: AC
  Filled 2014-07-02: qty 1000

## 2014-07-02 MED ORDER — ALUM & MAG HYDROXIDE-SIMETH 200-200-20 MG/5ML PO SUSP: 30.0000 mL | ORAL | Status: DC | PRN

## 2014-07-02 MED ORDER — OXYCODONE HCL 5 MG PO TABS
ORAL_TABLET | ORAL | Status: AC
  Filled 2014-07-02: qty 1

## 2014-07-02 MED ORDER — FENTANYL CITRATE 0.05 MG/ML IJ SOLN
INTRAMUSCULAR | Status: DC | PRN
  Administered 2014-07-02: 50 ug via INTRAVENOUS
  Administered 2014-07-02: 100 ug via INTRAVENOUS
  Administered 2014-07-02 (×5): 50 ug via INTRAVENOUS

## 2014-07-02 MED ORDER — ONDANSETRON HCL 4 MG/2ML IJ SOLN: 4.0000 mg | Freq: Four times a day (QID) | INTRAMUSCULAR | Status: DC | PRN

## 2014-07-02 MED ORDER — METHOCARBAMOL 500 MG PO TABS
500.0000 mg | ORAL_TABLET | Freq: Four times a day (QID) | ORAL | Status: DC | PRN
  Administered 2014-07-02 – 2014-07-03 (×2): 500 mg via ORAL
  Filled 2014-07-02 (×2): qty 1

## 2014-07-02 MED ORDER — DEXTROSE 5 % IV SOLN: 500.0000 mg | Freq: Four times a day (QID) | INTRAVENOUS | Status: DC | PRN

## 2014-07-02 MED ORDER — METOCLOPRAMIDE HCL 5 MG PO TABS: 5.0000 mg | ORAL_TABLET | Freq: Three times a day (TID) | ORAL | Status: DC | PRN

## 2014-07-02 MED ORDER — PROPOFOL 10 MG/ML IV BOLUS
INTRAVENOUS | Status: AC
  Filled 2014-07-02: qty 20

## 2014-07-02 MED ORDER — ACETAMINOPHEN 650 MG RE SUPP: 650.0000 mg | Freq: Four times a day (QID) | RECTAL | Status: DC | PRN

## 2014-07-02 MED ORDER — METOCLOPRAMIDE HCL 5 MG/ML IJ SOLN: 5.0000 mg | Freq: Three times a day (TID) | INTRAMUSCULAR | Status: DC | PRN

## 2014-07-02 MED ORDER — ROCURONIUM BROMIDE 100 MG/10ML IV SOLN
INTRAVENOUS | Status: DC | PRN
  Administered 2014-07-02: 30 mg via INTRAVENOUS
  Administered 2014-07-02: 20 mg via INTRAVENOUS

## 2014-07-02 MED ORDER — SODIUM CHLORIDE 0.9 % IR SOLN
Status: DC | PRN
  Administered 2014-07-02: 3000 mL

## 2014-07-02 MED ORDER — ACETAMINOPHEN 325 MG PO TABS: 650.0000 mg | ORAL_TABLET | Freq: Four times a day (QID) | ORAL | Status: DC | PRN

## 2014-07-02 MED ORDER — PHENYLEPHRINE HCL 10 MG/ML IJ SOLN
INTRAMUSCULAR | Status: DC | PRN
  Administered 2014-07-02 (×2): 80 ug via INTRAVENOUS

## 2014-07-02 MED ORDER — HYDROMORPHONE HCL 1 MG/ML IJ SOLN
0.2500 mg | INTRAMUSCULAR | Status: DC | PRN
  Administered 2014-07-02 (×4): 0.5 mg via INTRAVENOUS

## 2014-07-02 MED ORDER — METHOCARBAMOL 500 MG PO TABS
ORAL_TABLET | ORAL | Status: AC
  Filled 2014-07-02: qty 1

## 2014-07-02 MED ORDER — NEOSTIGMINE METHYLSULFATE 10 MG/10ML IV SOLN
INTRAVENOUS | Status: DC | PRN
  Administered 2014-07-02: 4 mg via INTRAVENOUS
  Administered 2014-07-02: 1 mg via INTRAVENOUS

## 2014-07-02 MED ORDER — NEOSTIGMINE METHYLSULFATE 10 MG/10ML IV SOLN
INTRAVENOUS | Status: AC
  Filled 2014-07-02: qty 1

## 2014-07-02 MED ORDER — ALBUTEROL SULFATE HFA 108 (90 BASE) MCG/ACT IN AERS
INHALATION_SPRAY | RESPIRATORY_TRACT | Status: AC
  Filled 2014-07-02: qty 6.7

## 2014-07-02 MED ORDER — MENTHOL 3 MG MT LOZG: 1.0000 | LOZENGE | OROMUCOSAL | Status: DC | PRN

## 2014-07-02 MED ORDER — GLYCOPYRROLATE 0.2 MG/ML IJ SOLN
INTRAMUSCULAR | Status: DC | PRN
  Administered 2014-07-02: .7 mg via INTRAVENOUS
  Administered 2014-07-02: 0.2 mg via INTRAVENOUS

## 2014-07-02 MED ORDER — ONDANSETRON HCL 4 MG/2ML IJ SOLN
INTRAMUSCULAR | Status: DC | PRN
  Administered 2014-07-02: 4 mg via INTRAVENOUS

## 2014-07-02 MED ORDER — OXYCODONE HCL 5 MG/5ML PO SOLN: 5.0000 mg | Freq: Once | ORAL | Status: AC | PRN

## 2014-07-02 MED ORDER — OXYCODONE HCL 5 MG PO TABS
5.0000 mg | ORAL_TABLET | Freq: Once | ORAL | Status: AC | PRN
  Administered 2014-07-02: 5 mg via ORAL

## 2014-07-02 MED ORDER — ASPIRIN EC 325 MG PO TBEC
325.0000 mg | DELAYED_RELEASE_TABLET | Freq: Every day | ORAL | Status: DC
  Administered 2014-07-03 – 2014-07-06 (×4): 325 mg via ORAL
  Filled 2014-07-02 (×4): qty 1

## 2014-07-02 MED ORDER — CEFAZOLIN SODIUM-DEXTROSE 2-3 GM-% IV SOLR
2.0000 g | INTRAVENOUS | Status: AC
  Administered 2014-07-02: 2 g via INTRAVENOUS

## 2014-07-02 MED ORDER — ONDANSETRON HCL 4 MG PO TABS: 4.0000 mg | ORAL_TABLET | Freq: Four times a day (QID) | ORAL | Status: DC | PRN

## 2014-07-02 MED ORDER — LIDOCAINE HCL (CARDIAC) 20 MG/ML IV SOLN
INTRAVENOUS | Status: DC | PRN
  Administered 2014-07-02: 60 mg via INTRAVENOUS

## 2014-07-02 MED ORDER — LACTATED RINGERS IV SOLN
INTRAVENOUS | Status: DC | PRN
  Administered 2014-07-02: 17:00:00 via INTRAVENOUS

## 2014-07-02 MED ORDER — SODIUM CHLORIDE 0.9 % IV SOLN
INTRAVENOUS | Status: DC
  Administered 2014-07-02 – 2014-07-05 (×2): via INTRAVENOUS

## 2014-07-02 MED ORDER — CEFAZOLIN SODIUM-DEXTROSE 2-3 GM-% IV SOLR
2.0000 g | Freq: Four times a day (QID) | INTRAVENOUS | Status: AC
  Administered 2014-07-03 (×2): 2 g via INTRAVENOUS
  Filled 2014-07-02 (×2): qty 50

## 2014-07-02 MED ORDER — VANCOMYCIN HCL 1000 MG IV SOLR
INTRAVENOUS | Status: DC | PRN
  Administered 2014-07-02: 1000 mg

## 2014-07-02 MED ORDER — OXYCODONE HCL 5 MG PO TABS
5.0000 mg | ORAL_TABLET | ORAL | Status: DC | PRN
  Administered 2014-07-03 (×4): 10 mg via ORAL
  Filled 2014-07-02 (×4): qty 2

## 2014-07-02 MED ORDER — FENTANYL CITRATE 0.05 MG/ML IJ SOLN
INTRAMUSCULAR | Status: AC
  Filled 2014-07-02: qty 5

## 2014-07-02 MED ORDER — ONDANSETRON HCL 4 MG/2ML IJ SOLN: 4.0000 mg | Freq: Once | INTRAMUSCULAR | Status: DC | PRN

## 2014-07-02 MED ORDER — HYDROMORPHONE HCL 1 MG/ML IJ SOLN: 1.0000 mg | INTRAMUSCULAR | Status: DC | PRN

## 2014-07-02 MED ORDER — LACTATED RINGERS IV SOLN
INTRAVENOUS | Status: DC
  Administered 2014-07-02: 16:00:00 via INTRAVENOUS

## 2014-07-02 MED ORDER — PROPOFOL 10 MG/ML IV BOLUS
INTRAVENOUS | Status: DC | PRN
  Administered 2014-07-02: 150 mg via INTRAVENOUS

## 2014-07-02 MED ORDER — HYDROMORPHONE HCL 1 MG/ML IJ SOLN
INTRAMUSCULAR | Status: AC
  Filled 2014-07-02: qty 2

## 2014-07-02 MED ORDER — GENTAMICIN SULFATE 40 MG/ML IJ SOLN
INTRAMUSCULAR | Status: DC | PRN
  Administered 2014-07-02: 240 mg via INTRAMUSCULAR

## 2014-07-02 MED ORDER — MIDAZOLAM HCL 2 MG/2ML IJ SOLN
INTRAMUSCULAR | Status: AC
  Filled 2014-07-02: qty 2

## 2014-07-02 MED ORDER — FERROUS SULFATE 325 (65 FE) MG PO TABS
325.0000 mg | ORAL_TABLET | Freq: Three times a day (TID) | ORAL | Status: DC
  Administered 2014-07-03 – 2014-07-06 (×9): 325 mg via ORAL
  Filled 2014-07-02 (×9): qty 1

## 2014-07-02 MED ORDER — DIPHENHYDRAMINE HCL 12.5 MG/5ML PO ELIX: 12.5000 mg | ORAL_SOLUTION | ORAL | Status: DC | PRN

## 2014-07-02 MED ORDER — GLYCOPYRROLATE 0.2 MG/ML IJ SOLN
INTRAMUSCULAR | Status: AC
  Filled 2014-07-02: qty 3

## 2014-07-02 SURGICAL SUPPLY — 56 items
BLADE SAW SAG 73X25 THK (BLADE)
BLADE SAW SGTL 73X25 THK (BLADE) IMPLANT
BLADE SURG 10 STRL SS (BLADE) IMPLANT
BLADE SURG 21 STRL SS (BLADE) ×2 IMPLANT
BRUSH FEMORAL CANAL (MISCELLANEOUS) IMPLANT
CABLE (Orthopedic Implant) ×4 IMPLANT
CABLE CERLAGE W/CRIMP 1.8 (Cable) ×2 IMPLANT
COVER BACK TABLE 24X17X13 BIG (DRAPES) IMPLANT
COVER SURGICAL LIGHT HANDLE (MISCELLANEOUS) IMPLANT
DRAPE C-ARM 42X72 X-RAY (DRAPES) ×2 IMPLANT
DRAPE IMP U-DRAPE 54X76 (DRAPES) ×2 IMPLANT
DRAPE INCISE IOBAN 85X60 (DRAPES) ×2 IMPLANT
DRAPE ORTHO SPLIT 77X108 STRL (DRAPES) ×2
DRAPE SURG ORHT 6 SPLT 77X108 (DRAPES) ×2 IMPLANT
DRAPE U-SHAPE 47X51 STRL (DRAPES) ×2 IMPLANT
DRSG MEPILEX BORDER 4X12 (GAUZE/BANDAGES/DRESSINGS) ×2 IMPLANT
DRSG MEPILEX BORDER 4X8 (GAUZE/BANDAGES/DRESSINGS) ×2 IMPLANT
DURAPREP 26ML APPLICATOR (WOUND CARE) ×2 IMPLANT
ELECT BLADE 6.5 EXT (BLADE) IMPLANT
ELECT CAUTERY BLADE 6.4 (BLADE) ×2 IMPLANT
ELECT REM PT RETURN 9FT ADLT (ELECTROSURGICAL) ×2
ELECTRODE REM PT RTRN 9FT ADLT (ELECTROSURGICAL) ×1 IMPLANT
GLOVE BIOGEL PI IND STRL 9 (GLOVE) ×1 IMPLANT
GLOVE BIOGEL PI INDICATOR 9 (GLOVE) ×1
GLOVE SURG ORTHO 9.0 STRL STRW (GLOVE) ×2 IMPLANT
GOWN STRL REUS W/ TWL XL LVL3 (GOWN DISPOSABLE) ×2 IMPLANT
GOWN STRL REUS W/TWL XL LVL3 (GOWN DISPOSABLE) ×2
HANDPIECE INTERPULSE COAX TIP (DISPOSABLE) ×1
HD FEM M 36 (Head) ×2 IMPLANT
KIT BASIN OR (CUSTOM PROCEDURE TRAY) ×2 IMPLANT
KIT ROOM TURNOVER OR (KITS) ×2 IMPLANT
KIT STIMULAN RAPID CURE  10CC (Orthopedic Implant) ×1 IMPLANT
KIT STIMULAN RAPID CURE 10CC (Orthopedic Implant) ×1 IMPLANT
MANIFOLD NEPTUNE II (INSTRUMENTS) ×2 IMPLANT
NS IRRIG 1000ML POUR BTL (IV SOLUTION) ×2 IMPLANT
PACK TOTAL JOINT (CUSTOM PROCEDURE TRAY) ×2 IMPLANT
PACK UNIVERSAL I (CUSTOM PROCEDURE TRAY) ×2 IMPLANT
PAD ARMBOARD 7.5X6 YLW CONV (MISCELLANEOUS) ×4 IMPLANT
PRESSURIZER FEMORAL UNIV (MISCELLANEOUS) IMPLANT
SET HNDPC FAN SPRY TIP SCT (DISPOSABLE) ×1 IMPLANT
STAPLER VISISTAT (STAPLE) ×2 IMPLANT
STAPLER VISISTAT 35W (STAPLE) ×2 IMPLANT
SUT ETHIBOND NAB CT1 #1 30IN (SUTURE) IMPLANT
SUT VIC AB 0 CT1 27 (SUTURE) ×2
SUT VIC AB 0 CT1 27XBRD ANBCTR (SUTURE) ×2 IMPLANT
SUT VIC AB 1 CT1 27 (SUTURE) ×1
SUT VIC AB 1 CT1 27XBRD ANBCTR (SUTURE) ×1 IMPLANT
SUT VIC AB 1 CTX 36 (SUTURE)
SUT VIC AB 1 CTX36XBRD ANBCTR (SUTURE) IMPLANT
SUT VIC AB 2-0 CTB1 (SUTURE) IMPLANT
TIP HIGH FLOW IRRIGATION COAX (MISCELLANEOUS) ×2 IMPLANT
TOWEL OR 17X24 6PK STRL BLUE (TOWEL DISPOSABLE) ×2 IMPLANT
TOWEL OR 17X26 10 PK STRL BLUE (TOWEL DISPOSABLE) ×2 IMPLANT
TOWER CARTRIDGE SMART MIX (DISPOSABLE) IMPLANT
TRAY FOLEY CATH 16FRSI W/METER (SET/KITS/TRAYS/PACK) IMPLANT
WATER STERILE IRR 1000ML POUR (IV SOLUTION) IMPLANT

## 2014-07-02 NOTE — Anesthesia Preprocedure Evaluation (Signed)
Anesthesia Evaluation  Patient identified by MRN, date of birth, ID band Patient awake    Reviewed: Allergy & Precautions, NPO status , Patient's Chart, lab work & pertinent test results  Airway Mallampati: I  TM Distance: >3 FB Neck ROM: Full    Dental  (+) Teeth Intact, Dental Advisory Given   Pulmonary Current Smoker,  breath sounds clear to auscultation        Cardiovascular Rhythm:Regular Rate:Normal     Neuro/Psych    GI/Hepatic   Endo/Other    Renal/GU      Musculoskeletal   Abdominal   Peds  Hematology   Anesthesia Other Findings   Reproductive/Obstetrics                             Anesthesia Physical Anesthesia Plan  ASA: II  Anesthesia Plan: General   Post-op Pain Management:    Induction: Intravenous  Airway Management Planned: Oral ETT  Additional Equipment:   Intra-op Plan:   Post-operative Plan: Extubation in OR  Informed Consent: I have reviewed the patients History and Physical, chart, labs and discussed the procedure including the risks, benefits and alternatives for the proposed anesthesia with the patient or authorized representative who has indicated his/her understanding and acceptance.   Dental advisory given  Plan Discussed with: CRNA, Surgeon and Anesthesiologist  Anesthesia Plan Comments:         Anesthesia Quick Evaluation

## 2014-07-02 NOTE — Op Note (Signed)
07/02/2014  7:55 PM  PATIENT:  Bianca Collins    PRE-OPERATIVE DIAGNOSIS:  Right Perprosthetic Femur Fracture  POST-OPERATIVE DIAGNOSIS:  Same  PROCEDURE:  Open Reduction Internal Fixation Femur Fracture, Revision Femur Total Hip Arthroplasty, placement of antibiotic beads  SURGEON:  Newt Minion, MD  PHYSICIAN ASSISTANT:None ANESTHESIA:   General  PREOPERATIVE INDICATIONS:  [name] is a  69 y.o. female with a diagnosis of Right Perprosthetic Femur Fracture who failed conservative measures and elected for surgical management.    The risks benefits and alternatives were discussed with the patient preoperatively including but not limited to the risks of infection, bleeding, nerve injury, cardiopulmonary complications, the need for revision surgery, among others, and the patient was willing to proceed.  OPERATIVE IMPLANTS: 3 similar cables. A +0 femoral neck with new 36 mm ball. Size 3 femur. Stimulant antibiotic beads 10 mL with 1 g vancomycin and 240 mg gentamicin  OPERATIVE FINDINGS: Multiple fractures through the greater trochanter as well as through the neck and shaft of the femur. This has the appearance of an acute fracture  OPERATIVE PROCEDURE: Patient is a 69 year old woman who denies any specific trauma states that she had a little bit thigh soreness but did not have pain with activities of daily living. Radiographs showed a comminuted fracture of the greater trochanter and fractures through the femoral stem longitudinally. Patient had subsidence of the femoral stem and patient was brought urgently at this time for revision of the total hip arthroplasty and internal fixation of the femur. Risk and benefits were discussed including infection neurovascular injury pain DVT increased risk of infection risk of dislocation. Patient states she understands and wishes to proceed at this time.  Patient was brought to the operating room and underwent a general anesthetic. After adequate levels  of anesthesia were obtained patient was placed in the left lateral decubitus position with the right side up and the right lower extremity was prepped using DuraPrep draped into a sterile field Ioban was used to cover all exposed skin. A posterior lateral incision was used to go back into the hip. The sutures were removed visualization showed a comminuted fracture of the greater trochanter as well as longitudinal fractures through the femoral stem extending up to the neck. This did not have the appearance of a stress fracture from the initial implant this appeared to be more of an acute traumatic fracture. The femur and ball were removed. The wound was irrigated with pulsatile lavage. There is no sign of infection. There was a large seroma. After irrigation with normal saline 3 cables were used to secure the femur fracture including the greater trochanter and shaft fracture. These were loosely tightened. Femoral stem was then inserted aligned the length was checked and the cables were then tightened. C-arm floss be verified alignment and compression. A new +0 head was inserted and the hip was reduced. The leg lengths were equal. C-arm fluoroscopy was again checked and the alignment was stable there was no evidence of any further malalignment or any displacement of the fracture. After irrigated with pulsatile lavage antibiotic beads were placed the tensor fascia lata was closed using #1 Vicryl subcutaneous is closed using 0 Vicryl the skin was closed using staples. A Mepilex dressing was applied patient was extubated taken to the PACU in stable condition.

## 2014-07-02 NOTE — Anesthesia Postprocedure Evaluation (Signed)
Anesthesia Post Note  Patient: Bianca Collins  Procedure(s) Performed: Procedure(s) (LRB): Open Reduction Internal Fixation Femur Fracture, Revision Femur Total Hip Arthroplasty (Right)  Anesthesia type: General  Patient location: PACU  Post pain: Pain level controlled  Post assessment: Post-op Vital signs reviewed  Last Vitals: BP 119/61 mmHg  Pulse 69  Temp(Src) 36.4 C (Oral)  Resp 14  Ht 5\' 6"  (1.676 m)  Wt 180 lb (81.647 kg)  BMI 29.07 kg/m2  SpO2 100%  Post vital signs: Reviewed  Level of consciousness: sedated  Complications: No apparent anesthesia complications

## 2014-07-02 NOTE — Transfer of Care (Signed)
Immediate Anesthesia Transfer of Care Note  Patient: Bianca Collins  Procedure(s) Performed: Procedure(s): Open Reduction Internal Fixation Femur Fracture, Revision Femur Total Hip Arthroplasty (Right)  Patient Location: PACU  Anesthesia Type:General  Level of Consciousness: awake, alert  and oriented  Airway & Oxygen Therapy: Patient connected to nasal cannula oxygen  Post-op Assessment: Report given to RN, Post -op Vital signs reviewed and stable and Patient moving all extremities X 4  Post vital signs: Reviewed and stable  Last Vitals:  Filed Vitals:   07/02/14 2023  BP:   Pulse:   Temp: 36.3 C  Resp:     Complications: No apparent anesthesia complications

## 2014-07-02 NOTE — H&P (Signed)
Bianca Collins is an 69 y.o. female.   Chief Complaint: Right thigh stiffness HPI: Patient is 2 weeks status post right total hip arthroplasty. She return to the office essentially asymptomatic she was walking on her hip. She only complained of a little bit of muscle discomfort in the thigh. Radiographs showed a fracture of the femur and patient was brought urgently at this time for open reduction internal fixation as well as revision of the femoral component.  Past Medical History  Diagnosis Date  . Tobacco abuse   . Thyroid disease   . Hypothyroidism   . Arthritis   . Hx of adenomatous polyp of colon 06/13/2014    Past Surgical History  Procedure Laterality Date  . Vaginal hysterectomy  2001    complete  . Rotator cuff repair  09-2013  . Total hip arthroplasty Right 06/19/2014    Procedure: TOTAL HIP ARTHROPLASTY;  Surgeon: Newt Minion, MD;  Location: [facility];  Service: Orthopedics;  Laterality: Right;    Family History  Problem Relation Age of Onset  . Colon cancer Neg Hx   . Thyroid disease Neg Hx   . Stomach cancer Neg Hx    Social History:  reports that she has been smoking Cigarettes.  She has a 30 pack-year smoking history. She has never used smokeless tobacco. She reports that she drinks about 8.4 oz of alcohol per week. She reports that she does not use illicit drugs.  Allergies: No Known Allergies  Medications Prior to Admission  Medication Sig Dispense Refill  . aspirin EC 325 MG tablet Take 1 tablet (325 mg total) by mouth daily. 30 tablet 0  . estradiol (ESTRACE) 1 MG tablet Take 1 mg by mouth daily.    Marland Kitchen levothyroxine (SYNTHROID, LEVOTHROID) 125 MCG tablet Take 1 tablet (125 mcg total) by mouth daily before breakfast. 90 tablet 3  . oxyCODONE-acetaminophen (ROXICET) 5-325 MG per tablet Take 1 tablet by mouth every 4 (four) hours as needed for severe pain. 60 tablet 0    Results for orders placed or performed during the hospital encounter of 07/02/14 (from the past 48  hour(s))  Type and screen     Status: None   Collection Time: 07/02/14  3:25 PM  Result Value Ref Range   ABO/RH(D) O POS    Antibody Screen NEG    Sample Expiration 07/05/2014   ABO/Rh     Status: None   Collection Time: 07/02/14  3:25 PM  Result Value Ref Range   ABO/RH(D) O POS   CBC     Status: Abnormal   Collection Time: 07/02/14  3:45 PM  Result Value Ref Range   WBC 12.9 (H) 4.0 - 10.5 K/uL   RBC 3.57 (L) 3.87 - 5.11 MIL/uL   Hemoglobin 11.5 (L) 12.0 - 15.0 g/dL   HCT 35.6 (L) 36.0 - 46.0 %   MCV 99.7 78.0 - 100.0 fL   MCH 32.2 26.0 - 34.0 pg   MCHC 32.3 30.0 - 36.0 g/dL   RDW 14.0 11.5 - 15.5 %   Platelets 457 (H) 150 - 400 K/uL   No results found.  Review of Systems  All other systems reviewed and are negative.   Blood pressure 111/47, pulse 71, temperature 98.7 F (37.1 C), temperature source Oral, resp. rate 18, height 5\' 6"  (1.676 m), weight 81.647 kg (180 lb), SpO2 95 %. Physical Exam  On examination patient's right lower extremity was neurovascular intact. Radiograph shows fractures through the greater trochanter as  well as longitudinal fractures through the femoral shaft Assessment/Plan Assessment complex periprosthetic fracture right total hip arthroplasty.  Plan: We'll plan for urgent open reduction internal fixation as well as revision of the femoral component. Risks and benefits were discussed patient states she understands and wishes to proceed at this time.  DUDA,MARCUS V 07/02/2014, 8:06 PM

## 2014-07-02 NOTE — Anesthesia Procedure Notes (Addendum)
Procedure Name: Intubation Date/Time: 07/02/2014 6:22 PM Performed by: Valetta Fuller Pre-anesthesia Checklist: Patient identified, Timeout performed, Emergency Drugs available, Suction available and Patient being monitored Patient Re-evaluated:Patient Re-evaluated prior to inductionOxygen Delivery Method: Circle system utilized Preoxygenation: Pre-oxygenation with 100% oxygen Intubation Type: IV induction Ventilation: Mask ventilation without difficulty Laryngoscope Size: Mac and 3 Grade View: Grade I Tube type: Oral Tube size: 7.5 mm Number of attempts: 1 Placement Confirmation: ETT inserted through vocal cords under direct vision,  breath sounds checked- equal and bilateral and positive ETCO2 Secured at: 23 cm Tube secured with: Tape Dental Injury: Teeth and Oropharynx as per pre-operative assessment    Intubation by Clearnce Sorrel

## 2014-07-03 LAB — CBC
HCT: 29 % — ABNORMAL LOW (ref 36.0–46.0)
HCT: 25.9 % — ABNORMAL LOW (ref 36.0–46.0)
Hemoglobin: 9.4 g/dL — ABNORMAL LOW (ref 12.0–15.0)
Hemoglobin: 8.6 g/dL — ABNORMAL LOW (ref 12.0–15.0)
MCH: 32.6 pg (ref 26.0–34.0)
MCH: 32.6 pg (ref 26.0–34.0)
MCHC: 32.4 g/dL (ref 30.0–36.0)
MCHC: 33.2 g/dL (ref 30.0–36.0)
MCV: 100.7 fL — ABNORMAL HIGH (ref 78.0–100.0)
MCV: 98.1 fL (ref 78.0–100.0)
PLATELETS: 449 10*3/uL — AB (ref 150–400)
PLATELETS: 416 10*3/uL — AB (ref 150–400)
RBC: 2.88 MIL/uL — AB (ref 3.87–5.11)
RBC: 2.64 MIL/uL — ABNORMAL LOW (ref 3.87–5.11)
RDW: 13.9 % (ref 11.5–15.5)
RDW: 13.9 % (ref 11.5–15.5)
WBC: 13.5 10*3/uL — AB (ref 4.0–10.5)
WBC: 12.7 10*3/uL — ABNORMAL HIGH (ref 4.0–10.5)

## 2014-07-03 LAB — BASIC METABOLIC PANEL
Anion gap: 7 (ref 5–15)
BUN: 13 mg/dL (ref 6–23)
CHLORIDE: 105 mmol/L (ref 96–112)
CO2: 26 mmol/L (ref 19–32)
Calcium: 8.7 mg/dL (ref 8.4–10.5)
Creatinine, Ser: 0.83 mg/dL (ref 0.50–1.10)
GFR, EST AFRICAN AMERICAN: 83 mL/min — AB (ref >90)
GFR, EST NON AFRICAN AMERICAN: 72 mL/min — AB (ref >90)
Glucose, Bld: 124 mg/dL — ABNORMAL HIGH (ref 70–99)
Potassium: 4.7 mmol/L (ref 3.5–5.1)
SODIUM: 138 mmol/L (ref 135–145)

## 2014-07-03 MED ORDER — OXYCODONE HCL 5 MG PO TABS
5.0000 mg | ORAL_TABLET | ORAL | Status: DC | PRN
  Administered 2014-07-04 – 2014-07-05 (×2): 5 mg via ORAL
  Filled 2014-07-03: qty 1

## 2014-07-03 NOTE — Evaluation (Signed)
Physical Therapy Evaluation Patient Details Name: Bianca Collins MRN: [id number] DOB: 03-31-1945 Today's Date: 07/03/2014   History of Present Illness  69 y.o. female admitted to [facility] on 07/02/14 from ortho Md's office where, at her post op follow up she was found to have a R periprosthetic fx on f/u x-rays.  Pt underwent revision of R THA with posterior precuations and is now TDWB post op.  Pt with significant PMHx of R THA on 06/19/14, hypothyroidism, and rotator cuff repair.    Clinical Impression  Pt was very limited in her mobility this AM due to lightheadedness and nausea once she was seated EOB.  We were unable to stand or attempt gait with RW.  PT to check back this PM to see if we can attempt OOB mobility.  BP was soft.  RN made aware.  She will likely progress well enough to return home with HHPT resuming at discharge, but she may need to stay another night to get 1-2 more successful therapy sessions.    Follow Up Recommendations Home health PT;Supervision/Assistance - 24 hour    Equipment Recommendations  None recommended by PT    Recommendations for Other Services   NA    Precautions / Restrictions Precautions Precautions: Posterior Hip;Fall Precaution Comments: verbally reviewed posterior hip precautions with pt.  Restrictions Weight Bearing Restrictions: Yes RLE Weight Bearing: Weight bearing as tolerated      Mobility  Bed Mobility Overal bed mobility: Needs Assistance Bed Mobility: Supine to Sit;Sit to Supine     Supine to sit: Min assist Sit to supine: Min assist   General bed mobility comments: Min assit to help maneuver right leg into and out of bed.   Transfers                 General transfer comment: too lightheaded and nauseated in sitting to attempt to stand EOB.           Balance Overall balance assessment: Needs assistance Sitting-balance support: Feet supported;No upper extremity supported Sitting balance-Leahy Scale: Fair                                       Pertinent Vitals/Pain Pain Assessment: 0-10 Pain Score: 7  Pain Location: right hip Pain Descriptors / Indicators: Aching;Burning Pain Intervention(s): Limited activity within patient's tolerance;Monitored during session;Repositioned    07/03/14 1206  Vital Signs  Pulse Rate 77  BP (!) 97/54 mmHg  BP Location Left Arm  BP Method Automatic  Patient Position (if appropriate) Lying  Oxygen Therapy  SpO2 92 %  O2 Device Room Air    Home Living Family/patient expects to be discharged to:: Private residence Living Arrangements: Children Available Help at Discharge: Available 24 hours/day;Family Type of Home: House Home Access: Stairs to enter Entrance Stairs-Rails: None Entrance Stairs-Number of Steps: 2 Home Layout: One level Home Equipment: Environmental consultant - 2 wheels;Walker - 4 wheels;Cane - single point;Shower seat - built in Agricultural consultant)      Prior Function Level of Independence: Independent with assistive device(s)         Comments: still active with HHPT     Hand Dominance   Dominant Hand: Right    Extremity/Trunk Assessment   Upper Extremity Assessment: Overall WFL for tasks assessed           Lower Extremity Assessment: RLE deficits/detail RLE Deficits / Details: right leg with normal post op pain and  weakness.  Ankle 3/5, knee 3-/5, hip 2/5    Cervical / Trunk Assessment: Normal  Communication   Communication: No difficulties  Cognition Arousal/Alertness: Awake/alert Behavior During Therapy: WFL for tasks assessed/performed Overall Cognitive Status: Within Functional Limits for tasks assessed                      General Comments General comments (skin integrity, edema, etc.): BP, HR and O2 sats taken after re-positioned in supine.  Pt's HR and O2 sats were good.  BP was low.  97/54.    Exercises Total Joint Exercises Ankle Circles/Pumps: AROM;Both;10 reps;Supine Quad Sets: AROM;Right;10 reps;Supine Heel  Slides: AAROM;Right;10 reps;Supine      Assessment/Plan    PT Assessment Patient needs continued PT services  PT Diagnosis Abnormality of gait;Difficulty walking;Generalized weakness;Acute pain   PT Problem List Decreased strength;Decreased range of motion;Decreased activity tolerance;Decreased balance;Decreased mobility;Decreased knowledge of use of DME;Decreased knowledge of precautions;Pain  PT Treatment Interventions DME instruction;Gait training;Stair training;Functional mobility training;Therapeutic activities;Therapeutic exercise;Balance training;Neuromuscular re-education;Patient/family education;Manual techniques;Modalities   PT Goals (Current goals can be found in the Care Plan section) Acute Rehab PT Goals Patient Stated Goal: to not be sick when she gets up PT Goal Formulation: With patient Time For Goal Achievement: 07/10/14 Potential to Achieve Goals: Good    Frequency 7X/week    End of Session   Activity Tolerance: Other (comment) (limited by lightheadedness and nausea in sitting) Patient left: in bed;with call bell/phone within reach Nurse Communication: Mobility status;Other (comment) (low BP and nausea)    Functional Assessment Tool Used: assist level Functional Limitation: Mobility: Walking and moving around Mobility: Walking and Moving Around Current Status [phone]): At least 20 percent but less than 40 percent impaired, limited or restricted Mobility: Walking and Moving Around Goal Status [phone]): At least 1 percent but less than 20 percent impaired, limited or restricted    Time: 1141-1210 PT Time Calculation (min) (ACUTE ONLY): 29 min   Charges:   PT Evaluation $Initial PT Evaluation Tier I: 1 Procedure PT Treatments $Therapeutic Activity: 8-22 mins   PT G Codes:   PT G-Codes **NOT FOR INPATIENT CLASS** Functional Assessment Tool Used: assist level Functional Limitation: Mobility: Walking and moving around Mobility: Walking and Moving Around Current  Status (F8182): At least 20 percent but less than 40 percent impaired, limited or restricted Mobility: Walking and Moving Around Goal Status [phone]): At least 1 percent but less than 20 percent impaired, limited or restricted    Keivon Garden B. [place], [place], DPT [phone]   07/03/2014, 12:24 PM

## 2014-07-03 NOTE — Progress Notes (Signed)
UR completed 

## 2014-07-03 NOTE — Discharge Summary (Signed)
Physician Discharge Summary  Patient ID: Bianca Collins MRN: [id number] DOB/AGE: 1945/12/18 69 y.o.  Admit date: 07/02/2014 Discharge date: 07/03/2014  Admission Diagnoses: Periprosthetic right femur fracture status post total hip arthroplasty  Discharge Diagnoses:  Active Problems:   Periprosth fracture around internal prosth r hip jt, sequela   Discharged Condition: stable  Hospital Course: Patient's hospital course was essentially unremarkable. She underwent removal of the femoral component cerclage band stabilization of the periprosthetic femur fracture with reimplantation of the femoral component with a new head. Patient underwent physical therapy she will now be touchdown weightbearing on the right most of her ambulation to be performed with a wheelchair.  Consults: None  Significant Diagnostic Studies: labs: Routine labs  Treatments: surgery: See operative note  Discharge Exam: Blood pressure 108/62, pulse 67, temperature 97.6 F (36.4 C), temperature source Oral, resp. rate 12, height 5\' 6"  (1.676 m), weight 81.647 kg (180 lb), SpO2 100 %. Incision/Wound: dressing clean and dry  Disposition: 06-Home-Health Care Svc  Discharge Instructions    Call MD / Call 911    Complete by:  As directed   If you experience chest pain or shortness of breath, CALL 911 and be transported to the hospital emergency room.  If you develope a fever above 101 F, pus (white drainage) or increased drainage or redness at the wound, or calf pain, call your surgeon's office.     Constipation Prevention    Complete by:  As directed   Drink plenty of fluids.  Prune juice may be helpful.  You may use a stool softener, such as Colace (over the counter) 100 mg twice a day.  Use MiraLax (over the counter) for constipation as needed.     Diet - low sodium heart healthy    Complete by:  As directed      Increase activity slowly as tolerated    Complete by:  As directed             Medication List     TAKE these medications        aspirin EC 325 MG tablet  Take 1 tablet (325 mg total) by mouth daily.     estradiol 1 MG tablet  Commonly known as:  ESTRACE  Take 1 mg by mouth daily.     levothyroxine 125 MCG tablet  Commonly known as:  SYNTHROID, LEVOTHROID  Take 1 tablet (125 mcg total) by mouth daily before breakfast.     oxyCODONE-acetaminophen 5-325 MG per tablet  Commonly known as:  ROXICET  Take 1 tablet by mouth every 4 (four) hours as needed for severe pain.           Follow-up Information    Follow up with DUDA,MARCUS V, MD In 2 weeks.   Specialty:  Orthopedic Surgery   Contact information:   [place] [address] [phone]       Signed: Newt Minion 07/03/2014, 6:57 AM

## 2014-07-03 NOTE — Progress Notes (Signed)
Patient ID: Bianca Collins, female   DOB: March 28, 1945, 69 y.o.   MRN: [id number] Postoperative day 1 status post cerclage band stabilization for the periprosthetic right femur fracture. Plan for physical therapy touchdown weightbearing on the right. If patient is stable with physical therapy she may be discharged to home this afternoon. Otherwise would keep her in the hospital for an additional day for therapy. Patient does have home health physical therapy.

## 2014-07-03 NOTE — Progress Notes (Signed)
Physical Therapy Treatment Patient Details Name: Bianca Collins MRN: [id number] DOB: [birth year] Today's Date: 07/03/2014    History of Present Illness 69 y.o. female admitted to [facility] on 07/02/14 from ortho Md's office where, at her post op follow up she was found to have a R periprosthetic fx on f/u x-rays.  Pt underwent revision of R THA with posterior precuations and is now TDWB post op.  Pt with significant PMHx of R THA on 06/19/14, hypothyroidism, and rotator cuff repair.      PT Comments    Pt continues to be limited by lightheadedness in sitting and unable to stand long enough for BP testing in standing or transfer OOB to chair.  Pt's orthostatic changes from supine to sitting were (+) see details below.  Pt will not be ready to go home tonight and will likely need BID treatment tomorrow.  RN made aware of BPs and is going to page MD.  PT will continue to follow acutely.   Follow Up Recommendations  Home health PT;Supervision/Assistance - 24 hour     Equipment Recommendations  None recommended by PT    Recommendations for Other Services   NA     Precautions / Restrictions Precautions Precautions: Posterior Hip;Fall Precaution Comments: pt able to report 2/3 posterior hip precautions.  Will need handout given tomorrow to reinforce.   Restrictions RLE Weight Bearing: Weight bearing as tolerated    Mobility  Bed Mobility Overal bed mobility: Needs Assistance Bed Mobility: Supine to Sit;Sit to Supine     Supine to sit: Min assist Sit to supine: Min assist   General bed mobility comments: Min assist to help support pt's right leg during transitions into and out of bed.   Transfers Overall transfer level: Needs assistance Equipment used: Rolling walker (2 wheeled) Transfers: Sit to/from Stand Sit to Stand: Min assist         General transfer comment: Min assist to support trunk during transition to stand and to stbilize RW.    Ambulation/Gait             General  Gait Details: Unable at this time due to (+) orthostatic BPs and very symptomatic.           Balance Overall balance assessment: Needs assistance Sitting-balance support: Feet supported;No upper extremity supported Sitting balance-Leahy Scale: Fair Sitting balance - Comments: supports herself on her arms in sitting, but likely due to lightheadeness in sitting and not actual sitting balance deficits.    Standing balance support: Bilateral upper extremity supported Standing balance-Leahy Scale: Poor Standing balance comment: needs external support in standing.                     Cognition Arousal/Alertness: Awake/alert Behavior During Therapy: WFL for tasks assessed/performed Overall Cognitive Status: Within Functional Limits for tasks assessed                      Exercises Total Joint Exercises Ankle Circles/Pumps: AROM;Both;10 reps;Supine Quad Sets: AROM;Right;10 reps;Supine Short Arc Quad: AAROM;Right;10 reps;Supine Heel Slides: AAROM;Right;10 reps;Supine Hip ABduction/ADduction: AAROM;Right;10 reps;Supine    General Comments General comments (skin integrity, edema, etc.): (+) orthostatics RN made aware that we were still unable to get up OOB and PT recommended paging MD.       Pertinent Vitals/Pain Pain Assessment: 0-10 Pain Score: 3  Pain Location: at rest in right hip Pain Descriptors / Indicators: Aching;Burning Pain Intervention(s): Limited activity within patient's tolerance;Monitored during session;Repositioned  PT Goals (current goals can now be found in the care plan section) Acute Rehab PT Goals Patient Stated Goal: to not be sick when she gets up    Frequency  7X/week    PT Plan Current plan remains appropriate       End of Session   Activity Tolerance: Patient limited by fatigue;Other (comment) (limited by lightheadedness and (+) orthostatics) Patient left: in bed;with call bell/phone within reach     Time:  4801-6553 PT Time Calculation (min) (ACUTE ONLY): 31 min  Charges:  $Therapeutic Exercise: 8-22 mins $Therapeutic Activity: 8-22 mins                      Flavius Repsher B. [place], [place], DPT [phone]   07/03/2014, 5:09 PM

## 2014-07-04 DIAGNOSIS — D62 Acute posthemorrhagic anemia: Secondary | ICD-10-CM | POA: Diagnosis not present

## 2014-07-04 DIAGNOSIS — Z90710 Acquired absence of both cervix and uterus: Secondary | ICD-10-CM | POA: Diagnosis not present

## 2014-07-04 DIAGNOSIS — T84040A Periprosthetic fracture around internal prosthetic right hip joint, initial encounter: Secondary | ICD-10-CM | POA: Diagnosis present

## 2014-07-04 DIAGNOSIS — Z7982 Long term (current) use of aspirin: Secondary | ICD-10-CM | POA: Diagnosis not present

## 2014-07-04 DIAGNOSIS — Z86010 Personal history of colonic polyps: Secondary | ICD-10-CM | POA: Diagnosis not present

## 2014-07-04 DIAGNOSIS — F17210 Nicotine dependence, cigarettes, uncomplicated: Secondary | ICD-10-CM | POA: Diagnosis present

## 2014-07-04 DIAGNOSIS — M1990 Unspecified osteoarthritis, unspecified site: Secondary | ICD-10-CM | POA: Diagnosis present

## 2014-07-04 DIAGNOSIS — E039 Hypothyroidism, unspecified: Secondary | ICD-10-CM | POA: Diagnosis present

## 2014-07-04 DIAGNOSIS — Y831 Surgical operation with implant of artificial internal device as the cause of abnormal reaction of the patient, or of later complication, without mention of misadventure at the time of the procedure: Secondary | ICD-10-CM | POA: Diagnosis present

## 2014-07-04 LAB — BASIC METABOLIC PANEL
ANION GAP: 8 (ref 5–15)
BUN: 12 mg/dL (ref 6–23)
CHLORIDE: 100 mmol/L (ref 96–112)
CO2: 28 mmol/L (ref 19–32)
Calcium: 8.5 mg/dL (ref 8.4–10.5)
Creatinine, Ser: 0.67 mg/dL (ref 0.50–1.10)
GFR calc non Af Amer: 90 mL/min — ABNORMAL LOW (ref >90)
GLUCOSE: 102 mg/dL — AB (ref 70–99)
Potassium: 3.8 mmol/L (ref 3.5–5.1)
Sodium: 136 mmol/L (ref 135–145)

## 2014-07-04 LAB — CBC
HCT: 24.6 % — ABNORMAL LOW (ref 36.0–46.0)
Hemoglobin: 8 g/dL — ABNORMAL LOW (ref 12.0–15.0)
MCH: 32 pg (ref 26.0–34.0)
MCHC: 32.5 g/dL (ref 30.0–36.0)
MCV: 98.4 fL (ref 78.0–100.0)
Platelets: 389 10*3/uL (ref 150–400)
RBC: 2.5 MIL/uL — ABNORMAL LOW (ref 3.87–5.11)
RDW: 13.9 % (ref 11.5–15.5)
WBC: 11 10*3/uL — AB (ref 4.0–10.5)

## 2014-07-04 NOTE — Progress Notes (Signed)
Patient ID: Bianca Collins, female   DOB: 01/16/1946, 69 y.o.   MRN: [id number] Ms. Babiarz needs to continue as an inpatient as she works with therapy in order to transition to home.  Her hgb was 8.0 this am (acute blood loss anemia), but she is asymptomatic.  Will check CBC in am.  Anticipate discharge to home likely Saturday.

## 2014-07-04 NOTE — Plan of Care (Signed)
Problem: Consults Goal: Diagnosis- Total Joint Replacement Outcome: Completed/Met Date Met:  07/04/14 Revision Total Hip ORIF RIght femur with revision to THR with ABX beads

## 2014-07-04 NOTE — Progress Notes (Signed)
Physical Therapy Treatment Patient Details Name: Bianca Collins MRN: [id number] DOB: 1945-05-03 Today's Date: 07/04/2014    History of Present Illness 69 y.o. female admitted to [facility] on 07/02/14 from ortho Md's office where, at her post op follow up she was found to have a R periprosthetic fx on f/u x-rays.  Pt underwent revision of R THA with posterior precuations and is now TDWB post op.  Pt with significant PMHx of R THA on 06/19/14, hypothyroidism, and rotator cuff repair.      PT Comments    Patient with slow progress.  Was able to make it to chair via squat pivot transfer due to continued symptoms of nausea and light headedness in standing.  BP better today, but continues to need increased time and unable to stand due to symptoms.  Could still go home with family assist and has wheelchair, but limited as of right now to bed to chair only.  Follow Up Recommendations  Home health PT;Supervision/Assistance - 24 hour     Equipment Recommendations  None recommended by PT    Recommendations for Other Services       Precautions / Restrictions Precautions Precautions: Posterior Hip;Fall Precaution Comments: pt able to report 2/3 posterior hip precautions.   handout given  Restrictions RLE Weight Bearing: Touchdown weight bearing    Mobility  Bed Mobility Overal bed mobility: Needs Assistance       Supine to sit: Mod assist     General bed mobility comments: assist to support right leg and to scoot out to edge of bed; increased time required  Transfers Overall transfer level: Needs assistance Equipment used: Rolling walker (2 wheeled) Transfers: Sit to/from [employer] Sit to Stand: Mod assist   Squat pivot transfers: Min assist     General transfer comment: lifting assist from bed to stand; stood briefly, c/o light headed and nausea so returned to sit with cues; then demonstrated squat pivot transfer which pt able to perform to get to  chair  Ambulation/Gait             General Gait Details: unable to tolerate ambulation due to nausea, light headed in standing   Stairs            Wheelchair Mobility    Modified Rankin (Stroke Patients Only)       Balance     Sitting balance-Leahy Scale: Fair Sitting balance - Comments: supports self with UE's due to hip position     Standing balance-Leahy Scale: Poor                      Cognition Arousal/Alertness: Awake/alert Behavior During Therapy: WFL for tasks assessed/performed Overall Cognitive Status: Within Functional Limits for tasks assessed                      Exercises Total Joint Exercises Ankle Circles/Pumps: AROM;Both;10 reps;Supine Quad Sets: AROM;Right;10 reps;Supine Short Arc Quad: AAROM;Right;10 reps;Supine Heel Slides: AAROM;Right;10 reps;Supine Hip ABduction/ADduction: AAROM;Right;10 reps;Supine    General Comments        Pertinent Vitals/Pain Pain Score: 3  Pain Location: right hip after exercise Pain Intervention(s): Monitored during session;Repositioned    Home Living                      Prior Function            PT Goals (current goals can now be found in the care plan section) Progress towards PT  goals: Progressing toward goals    Frequency  7X/week    PT Plan Current plan remains appropriate    Co-evaluation             End of Session Equipment Utilized During Treatment: Gait belt Activity Tolerance: Patient limited by fatigue;Other (comment) (nausea, light headed) Patient left: in chair;with call bell/phone within reach     Time: 0945-1029 PT Time Calculation (min) (ACUTE ONLY): 44 min  Charges:  $Therapeutic Exercise: 8-22 mins $Therapeutic Activity: 23-37 mins                    G Codes:      WYNN,CYNDI 21-Jul-2014, 11:17 AM  Magda Kiel, PT [phone] 21-Jul-2014

## 2014-07-04 NOTE — Progress Notes (Signed)
Physical Therapy Treatment Patient Details Name: Bianca Collins MRN: [id number] DOB: Feb 05, 1946 Today's Date: 07/04/2014    History of Present Illness 69 y.o. female admitted to [facility] on 07/02/14 from ortho Md's office where, at her post op follow up she was found to have a R periprosthetic fx on f/u x-rays.  Pt underwent revision of R THA with posterior precuations and is now TDWB post op.  Pt with significant PMHx of R THA on 06/19/14, hypothyroidism, and rotator cuff repair.      PT Comments    Patient progressing with mobility and able to take steps forward and maintain TDWB.  Would benefit from staying to practice steps to get into home.  Patient initially thought had wheelchair, but states she doesn't so will need one due to limited weight bearing.  Follow Up Recommendations  Home health PT;Supervision/Assistance - 24 hour     Equipment Recommendations  Wheelchair (measurements PT);Wheelchair cushion (measurements PT)    Recommendations for Other Services       Precautions / Restrictions Precautions Precautions: Posterior Hip;Fall Restrictions RLE Weight Bearing: Touchdown weight bearing    Mobility  Bed Mobility Overal bed mobility: Needs Assistance       Supine to sit: Min assist Sit to supine: Min assist   General bed mobility comments: assist for right leg and increased time to sscoot to edge of bed going to left side of bed  Transfers Overall transfer Collins: Needs assistance Equipment used: Rolling walker (2 wheeled) Transfers: Sit to/from Stand Sit to Stand: Min assist         General transfer comment: stood x 2, first time with c/o symptoms cues for keeping eyes straight ahead and deep breathing; then able to ambulate few steps second time  Ambulation/Gait Ambulation/Gait assistance: Min assist Ambulation Distance (Feet): 3 Feet Assistive device: Rolling walker (2 wheeled) Gait Pattern/deviations: Step-to pattern     General Gait Details: able to  maintain TDWB on right leg and take 3 steps forward and three back   Stairs            Wheelchair Mobility    Modified Rankin (Stroke Patients Only)       Balance             Standing balance-Leahy Scale: Poor Standing balance comment: UE support due to weight bearing limitations on right                    Cognition Arousal/Alertness: Awake/alert Behavior During Therapy: WFL for tasks assessed/performed Overall Cognitive Status: Within Functional Limits for tasks assessed                      Exercises      General Comments        Pertinent Vitals/Pain Pain Score: 4  Pain Location: right hip with mobility Pain Intervention(s): Monitored during session;Ice applied    Home Living                      Prior Function            PT Goals (current goals can now be found in the care plan section) Progress towards PT goals: Progressing toward goals    Frequency  7X/week    PT Plan Current plan remains appropriate    Co-evaluation             End of Session Equipment Utilized During Treatment: Gait belt Activity Tolerance: Patient limited by fatigue  Patient left: in bed;with call bell/phone within reach     Time: 1440-1505 PT Time Calculation (min) (ACUTE ONLY): 25 min  Charges:  $Gait Training: 8-22 mins $Therapeutic Activity: 8-22 mins                    G Codes:      Bianca Collins,Bianca Collins 2014-07-12, 4:05 PM  Bianca Collins, Bianca Collins July 12, 2014

## 2014-07-05 DIAGNOSIS — M9701XA Periprosthetic fracture around internal prosthetic right hip joint, initial encounter: Secondary | ICD-10-CM

## 2014-07-05 HISTORY — DX: Periprosthetic fracture around internal prosthetic right hip joint, initial encounter: M97.01XA

## 2014-07-05 LAB — CBC
HEMATOCRIT: 22.6 % — AB (ref 36.0–46.0)
HEMOGLOBIN: 7.5 g/dL — AB (ref 12.0–15.0)
MCH: 32.6 pg (ref 26.0–34.0)
MCHC: 33.2 g/dL (ref 30.0–36.0)
MCV: 98.3 fL (ref 78.0–100.0)
PLATELETS: 381 10*3/uL (ref 150–400)
RBC: 2.3 MIL/uL — AB (ref 3.87–5.11)
RDW: 14 % (ref 11.5–15.5)
WBC: 9.3 10*3/uL (ref 4.0–10.5)

## 2014-07-05 LAB — BASIC METABOLIC PANEL
Anion gap: 10 (ref 5–15)
BUN: 10 mg/dL (ref 6–23)
CO2: 26 mmol/L (ref 19–32)
Calcium: 8.6 mg/dL (ref 8.4–10.5)
Chloride: 101 mmol/L (ref 96–112)
Creatinine, Ser: 0.59 mg/dL (ref 0.50–1.10)
GFR calc non Af Amer: >90 mL/min (ref >90)
Glucose, Bld: 88 mg/dL (ref 70–99)
Potassium: 3.5 mmol/L (ref 3.5–5.1)
Sodium: 137 mmol/L (ref 135–145)

## 2014-07-05 LAB — PREPARE RBC (CROSSMATCH)

## 2014-07-05 MED ORDER — SODIUM CHLORIDE 0.9 % IV SOLN
Freq: Once | INTRAVENOUS | Status: AC
Start: 2014-07-05 — End: 2014-07-05
  Administered 2014-07-05: 17:00:00 via INTRAVENOUS

## 2014-07-05 NOTE — Progress Notes (Signed)
CARE MANAGEMENT NOTE 07/05/2014  Patient:  Bianca Collins, Bianca Collins   Account Number:  [id number]  Date Initiated:  07/05/2014  Documentation initiated by:  [facility]  Subjective/Objective Assessment:   rt femur fx, ORIF of femur fracture and revision of total hip arthroplasty     Action/Plan:   PT eval- recommended HHPT with supervision  patient is active with [facility] for HHPT   Anticipated DC Date:  07/06/2014   Anticipated DC Plan:  [place]  CM consult      [facility] Choice  DURABLE MEDICAL EQUIPMENT  Resumption Of Svcs/PTA Provider   Choice offered to / List presented to:  C-1 Patient   DME arranged  [place]      DME agency  [place] arranged  [place]   Status of service:  Completed, signed off Medicare Important Message given?  YES (If response is "NO", the following Medicare IM given date fields will be blank) Date Medicare IM given:  07/05/2014 Medicare IM given by:  [facility] Date Additional Medicare IM given:   Additional Medicare IM given by:    Discharge Disposition:  [place]  Per UR Regulation:  Reviewed for med. necessity/level of care/duration of stay   Comments:  07/05/14 Spoke with patient about d/c plan. She is active with [facility] for HHPT post her THA in March.Patient wishes to continue with [place] and plans to have family with her for several days after d/c and then daughter will be close by during the day. Informed her that PT recommends 24hr supervision. Patient stated that she will have the same support she had after her total hip and she did fine. Contacted Jermaine at [facility] and requested wheelchair be delivered to patient's room today.Contacted Pama Roskos at [facility] and resumed HHPT.

## 2014-07-05 NOTE — Progress Notes (Signed)
Physical Therapy Treatment Patient Details Name: Bianca Collins MRN: [id number] DOB: 1945/04/17 Today's Date: 07/05/2014    History of Present Illness 69 y.o. female admitted to [facility] on 07/02/14 from ortho Md's office where, at her post op follow up she was found to have a R periprosthetic fx on f/u x-rays.  Pt underwent revision of R THA with posterior precuations and is now TDWB post op.  Pt with significant PMHx of R THA on 06/19/14, hypothyroidism, and rotator cuff repair.  Post op complication of ABL, requiring blood transfusion.      PT Comments    Pt is progressing well with her mobility, able to tolerate short, in-room gait that may be required of her in the places in her home where the [facility] will not fit. She does well maintaining TDWB on her right during gait.  We will need to start to review stairs this PM.  PT will continue to follow acutely.   Follow Up Recommendations  Home health PT;Supervision - Intermittent     Equipment Recommendations  Wheelchair (measurements PT);Wheelchair cushion (measurements PT)    Recommendations for Other Services   NA     Precautions / Restrictions Precautions Precautions: Posterior Hip;Fall Precaution Booklet Issued: Yes (comment) Precaution Comments: Pt able to report 3/3 hip precautions and TDWB status of right leg.  Restrictions RLE Weight Bearing: Touchdown weight bearing    Mobility  Bed Mobility Overal bed mobility: Needs Assistance Bed Mobility: Supine to Sit     Supine to sit: Min assist;HOB elevated     General bed mobility comments: Min assist to help progress right leg over EOB.  Pt able to manage trunk with the use of bed rail and HOB elevated ~25 degrees  Transfers Overall transfer level: Needs assistance Equipment used: Rolling walker (2 wheeled) Transfers: Sit to/from Stand Sit to Stand: Min guard         General transfer comment: Min guard assist for safety during transitions.  Verbal cues to stay standing before  initiating walking to assess lightheadedness.   Ambulation/Gait Ambulation/Gait assistance: Min guard Ambulation Distance (Feet): 10 Feet Assistive device: Rolling walker (2 wheeled) Gait Pattern/deviations: Step-to pattern (hop to pattern due to TDWB status)     General Gait Details: Pt able to maintain TDWB on the right during gait, limited by arm strength and endurance and nausea.  Min guard assit for safety, chair to follow for safety.           Balance Overall balance assessment: Needs assistance Sitting-balance support: Feet supported;No upper extremity supported Sitting balance-Leahy Scale: Good     Standing balance support: Bilateral upper extremity supported Standing balance-Leahy Scale: Fair                      Cognition Arousal/Alertness: Awake/alert Behavior During Therapy: WFL for tasks assessed/performed Overall Cognitive Status: Within Functional Limits for tasks assessed                      Exercises Total Joint Exercises Ankle Circles/Pumps: AROM;Both;10 reps;Supine Quad Sets: AROM;Right;10 reps;Supine Short Arc Quad: AROM;Right;10 reps;Supine Heel Slides: AAROM;Right;10 reps;Supine Hip ABduction/ADduction: AAROM;Right;10 reps;Supine Long Arc Quad: AROM;Right;5 reps;Seated        Pertinent Vitals/Pain Pain Assessment: 0-10 Pain Score: 5  Pain Location: right hip Pain Descriptors / Indicators: Aching;Burning Pain Intervention(s): Limited activity within patient's tolerance;Monitored during session;Repositioned;Ice applied           PT Goals (current goals can now be found  in the care plan section) Acute Rehab PT Goals Patient Stated Goal: to go home tomorrow.  Progress towards PT goals: Progressing toward goals    Frequency  7X/week    PT Plan Current plan remains appropriate       End of Session   Activity Tolerance: Patient limited by fatigue;Other (comment) (limited by nausea) Patient left: in chair;with call  bell/phone within reach     Time: 1219-1231 PT Time Calculation (min) (ACUTE ONLY): 12 min  Charges:  $Therapeutic Activity: 8-22 mins                      Arnecia Ector B. [place], [place], DPT [phone]   07/05/2014, 2:28 PM

## 2014-07-05 NOTE — Progress Notes (Signed)
Subjective: 3 Days Post-Op Procedure(s) (LRB): Open Reduction Internal Fixation Femur Fracture, Revision Femur Total Hip Arthroplasty (Right) Patient reports pain as moderate.  Working slowly with therapy.  Symptomatic acute blood loss anemia.  Objective: Vital signs in last 24 hours: Temp:  [98.3 F (36.8 C)-99.3 F (37.4 C)] 98.3 F (36.8 C) (04/15 0511) Pulse Rate:  [75-87] 75 (04/15 0511) Resp:  [16] 16 (04/15 0511) BP: (103-114)/(44-58) 106/58 mmHg (04/15 0511) SpO2:  [92 %-96 %] 94 % (04/15 0511)  Intake/Output from previous day: 04/14 0701 - 04/15 0700 In: 480 [P.O.:480] Out: -  Intake/Output this shift:     Recent Labs  07/02/14 1545 07/03/14 0538 07/03/14 1958 07/04/14 0538 07/05/14 0534  HGB 11.5* 9.4* 8.6* 8.0* 7.5*    Recent Labs  07/04/14 0538 07/05/14 0534  WBC 11.0* 9.3  RBC 2.50* 2.30*  HCT 24.6* 22.6*  PLT 389 381    Recent Labs  07/03/14 0538 07/04/14 0538  NA 138 136  K 4.7 3.8  CL 105 100  CO2 26 28  BUN 13 12  CREATININE 0.83 0.67  GLUCOSE 124* 102*  CALCIUM 8.7 8.5   No results for input(s): LABPT, INR in the last 72 hours.  Sensation intact distally Intact pulses distally Dorsiflexion/Plantar flexion intact Incision: scant drainage No cellulitis present Compartment soft  Assessment/Plan: 3 Days Post-Op Procedure(s) (LRB): Open Reduction Internal Fixation Femur Fracture, Revision Femur Total Hip Arthroplasty (Right) Discharge home with home health likely over the weekend. Transfusion blood today.  Mardi Cannady Y 07/05/2014, 6:51 AM

## 2014-07-05 NOTE — Progress Notes (Signed)
Physical Therapy Treatment Patient Details Name: Bianca Collins MRN: [id number] DOB: 1945-07-29 Today's Date: 07/05/2014    History of Present Illness 69 y.o. female admitted to [facility] on 07/02/14 from ortho Md's office where, at her post op follow up she was found to have a R periprosthetic fx on f/u x-rays.  Pt underwent revision of R THA with posterior precuations and is now TDWB post op.  Pt with significant PMHx of R THA on 06/19/14, hypothyroidism, and rotator cuff repair.  Post op complication of ABL, requiring blood transfusion.      PT Comments    Pt is progressing well with her mobility. She was able to demonstrate safety on ascending and descending one curb step simulating home entry while maintaining TDWB on her right leg.  She is confident she will do well at home.  WC parts and management reviewed.  Plan is to d/c home in AM pending MD approval.  PT will continue to follow acutely until d/c.    Follow Up Recommendations  Home health PT;Supervision - Intermittent     Equipment Recommendations  Wheelchair (measurements PT);Wheelchair cushion (measurements PT)    Recommendations for Other Services   NA     Precautions / Restrictions Precautions Precautions: Posterior Hip;Fall Precaution Booklet Issued: Yes (comment) Precaution Comments: Pt able to report 3/3 hip precautions and TDWB status of right leg.  Restrictions RLE Weight Bearing: Touchdown weight bearing    Mobility  Bed Mobility Overal bed mobility: Needs Assistance Bed Mobility: Supine to Sit     Supine to sit: Min assist Sit to supine: Min assist   General bed mobility comments: Min assist to help progress her right leg into/out of bed.   Transfers Overall transfer level: Needs assistance Equipment used: Rolling walker (2 wheeled) Transfers: Sit to/from Stand Sit to Stand: Min guard         General transfer comment: Min guard assist to steady pt for balance during transitions.    Ambulation/Gait Ambulation/Gait assistance: Min guard Ambulation Distance (Feet): 10 Feet Assistive device: Rolling walker (2 wheeled) Gait Pattern/deviations: Step-to pattern (hop-to)     General Gait Details: Pt continues to be able to maintain TDWB on her right foot during gait.  Min guard assist for safety.    Stairs Stairs: Yes Stairs assistance: Min assist Stair Management: No rails;Step to pattern;Backwards;With walker Number of Stairs: 1 General stair comments: Verbal cues for correct LE sequencing, min assist to stabilize the RW during transitions. Pt reports she may just put the WC up on the step, turn around and sit down, but she does at least know how to do this method and did very well maintaining TDWB even on stairs.   Information systems manager mobility: Yes Wheelchair Assistance Details (indicate cue type and reason): I educated pt re: WC parts and management, we did not try to propel the WC at all.          Balance Overall balance assessment: Needs assistance Sitting-balance support: Feet supported;No upper extremity supported Sitting balance-Leahy Scale: Good     Standing balance support: No upper extremity supported;Bilateral upper extremity supported;Single extremity supported Standing balance-Leahy Scale: Fair                      Cognition Arousal/Alertness: Awake/alert Behavior During Therapy: WFL for tasks assessed/performed Overall Cognitive Status: Within Functional Limits for tasks assessed  Exercises Total Joint Exercises Ankle Circles/Pumps: AROM;Both;10 reps;Supine Quad Sets: AROM;Right;10 reps;Supine Short Arc Quad: AROM;Right;10 reps;Supine Heel Slides: AAROM;Right;10 reps;Supine Hip ABduction/ADduction: AAROM;Right;10 reps;Supine Long Arc Quad: AROM;Right;5 reps;Seated        Pertinent Vitals/Pain Pain Assessment: 0-10 Pain Score: 3  Pain Location: right hip Pain  Descriptors / Indicators: Aching Pain Intervention(s): Limited activity within patient's tolerance;Monitored during session;Repositioned           PT Goals (current goals can now be found in the care plan section) Acute Rehab PT Goals Patient Stated Goal: to go home tomorrow.  Progress towards PT goals: Progressing toward goals    Frequency  7X/week    PT Plan Current plan remains appropriate       End of Session   Activity Tolerance: Patient limited by fatigue Patient left: in bed;with call bell/phone within reach     Time: 1600-1630 PT Time Calculation (min) (ACUTE ONLY): 30 min  Charges:  $Gait Training: 8-22 mins $Therapeutic Exercise: 8-22 mins $Therapeutic Activity: 8-22 mins                      Aaronmichael Brumbaugh B. [place], [place], DPT [phone]   07/05/2014, 5:15 PM

## 2014-07-06 LAB — TYPE AND SCREEN
ABO/RH(D): O POS
ANTIBODY SCREEN: NEGATIVE
UNIT DIVISION: 0
Unit division: 0

## 2014-07-06 LAB — CBC
HCT: 30 % — ABNORMAL LOW (ref 36.0–46.0)
Hemoglobin: 9.9 g/dL — ABNORMAL LOW (ref 12.0–15.0)
MCH: 31.4 pg (ref 26.0–34.0)
MCHC: 33 g/dL (ref 30.0–36.0)
MCV: 95.2 fL (ref 78.0–100.0)
Platelets: 378 10*3/uL (ref 150–400)
RBC: 3.15 MIL/uL — AB (ref 3.87–5.11)
RDW: 15.4 % (ref 11.5–15.5)
WBC: 9.1 10*3/uL (ref 4.0–10.5)

## 2014-07-06 NOTE — Discharge Summary (Signed)
Patient ID: Bianca Collins MRN: [id number] DOB/AGE: 1945/10/06 69 y.o.  Admit date: 07/02/2014 Discharge date: 07/06/2014  Admission Diagnoses:  Active Problems:   Periprosth fracture around internal prosth r hip jt, sequela   Periprosthetic fracture around internal prosthetic right hip joint   Discharge Diagnoses:  Same  Past Medical History  Diagnosis Date  . Tobacco abuse   . Thyroid disease   . Hypothyroidism   . Arthritis   . Hx of adenomatous polyp of colon 06/13/2014    Surgeries: Procedure(s): Open Reduction Internal Fixation Femur Fracture, Revision Femur Total Hip Arthroplasty on 07/02/2014   Consultants:    Discharged Condition: Improved  Hospital Course: Bianca Collins is an 69 y.o. female who was admitted 07/02/2014 for operative treatment of<principal problem not specified>. Patient has severe unremitting pain that affects sleep, daily activities, and work/hobbies. After pre-op clearance the patient was taken to the operating room on 07/02/2014 and underwent  Procedure(s): Open Reduction Internal Fixation Femur Fracture, Revision Femur Total Hip Arthroplasty.    Patient was given perioperative antibiotics: Anti-infectives    Start     Dose/Rate Route Frequency Ordered Stop   07/02/14 2300  ceFAZolin (ANCEF) IVPB 2 g/50 mL premix     2 g 100 mL/hr over 30 Minutes Intravenous Every 6 hours 07/02/14 2206 07/03/14 0612   07/02/14 1857  gentamicin (GARAMYCIN) injection  Status:  Discontinued       As needed 07/02/14 1859 07/02/14 2020   07/02/14 1856  vancomycin (VANCOCIN) powder  Status:  Discontinued       As needed 07/02/14 1857 07/02/14 2020   07/02/14 1530  ceFAZolin (ANCEF) IVPB 2 g/50 mL premix     2 g 100 mL/hr over 30 Minutes Intravenous On call to O.R. 07/02/14 1511 07/02/14 1842       Patient was given sequential compression devices, early ambulation, and chemoprophylaxis to prevent DVT.  Patient benefited maximally from hospital stay and there were no  complications.  She did require a blood transfusion due to symptomatic acute blood loss anemia.  Recent vital signs: Patient Vitals for the past 24 hrs:  BP Temp Temp src Pulse Resp SpO2  07/06/14 0446 112/60 mmHg 98.2 F (36.8 C) Oral 65 16 96 %  07/06/14 0400 - - - - 16 98 %  07/06/14 0020 (!) 112/59 mmHg 98.6 F (37 C) Oral 76 18 97 %  07/06/14 0000 - - - - 18 96 %  07/05/14 2149 (!) 106/56 mmHg 98.6 F (37 C) Oral 76 18 96 %  07/05/14 2116 (!) 103/52 mmHg 98.6 F (37 C) Oral 77 18 96 %  07/05/14 2000 (!) 102/56 mmHg 98.6 F (37 C) Oral 77 18 96 %  07/05/14 1949 (!) 109/51 mmHg 98 F (36.7 C) Oral 80 18 97 %  07/05/14 1715 (!) 105/47 mmHg 98.2 F (36.8 C) Oral 79 18 95 %  07/05/14 1700 (!) 95/46 mmHg 98.6 F (37 C) Oral 82 16 95 %  07/05/14 1600 - - - - 18 -  07/05/14 1153 (!) 100/52 mmHg 98.1 F (36.7 C) Oral 78 16 95 %     Recent laboratory studies:  Recent Labs  07/04/14 0538 07/05/14 0534 07/06/14 0535  WBC 11.0* 9.3 9.1  HGB 8.0* 7.5* 9.9*  HCT 24.6* 22.6* 30.0*  PLT 389 381 378  NA 136 137  --   K 3.8 3.5  --   CL 100 101  --   CO2 28 26  --   BUN  12 10  --   CREATININE 0.67 0.59  --   GLUCOSE 102* 88  --   CALCIUM 8.5 8.6  --      Discharge Medications:     Medication List    TAKE these medications        aspirin EC 325 MG tablet  Take 1 tablet (325 mg total) by mouth daily.     estradiol 1 MG tablet  Commonly known as:  ESTRACE  Take 1 mg by mouth daily.     levothyroxine 125 MCG tablet  Commonly known as:  SYNTHROID, LEVOTHROID  Take 1 tablet (125 mcg total) by mouth daily before breakfast.     oxyCODONE-acetaminophen 5-325 MG per tablet  Commonly known as:  ROXICET  Take 1 tablet by mouth every 4 (four) hours as needed for severe pain.        Diagnostic Studies: Dg Chest 2 View  06/10/2014   CLINICAL DATA:  69 year old female with a history of preop evaluation for hip surgery. The patient has a history of 30 pack year history.   EXAM: CHEST - 2 VIEW  COMPARISON:  None.  FINDINGS: Cardiomediastinal silhouette projects within normal limits in size and contour. No confluent airspace disease, pneumothorax, or pleural effusion.  Nodular opacity at the right base laterally just superior to the diaphragm measures 4 mm x 10 mm. Small nodular density at the superior aspect of the right lung measures 3 mm.  Asymmetric elevation of the right hemidiaphragm.  No displaced fracture.  Unremarkable appearance of the upper abdomen.  IMPRESSION: No radiographic evidence of acute cardiopulmonary disease.  Nodular density at the base the right lung as well as a small 2 mm -3 mm in density at the upper aspect of the right lung. Given the patient's smoking history, a non-emergent noncontrast CT of the chest is recommended for further evaluation.  Asymmetric elevation of the right hemidiaphragm.  Signed,  Dulcy Fanny. Earleen Newport, DO  [facility]  [facility]  Signed,  Dulcy Fanny. Earleen Newport, DO  [facility]  [facility]   Electronically Signed   By: Corrie Mckusick D.O.   On: 06/10/2014 10:23   Ct Chest Wo Contrast  06/19/2014   CLINICAL DATA:  Smoker with right lung nodularity on recent chest radiographs. Recent total hip arthroplasty. Initial encounter.  EXAM: CT CHEST WITHOUT CONTRAST  TECHNIQUE: Multidetector CT imaging of the chest was performed following the standard protocol without IV contrast.  COMPARISON:  Radiographs 06/10/2014.  FINDINGS: Mediastinum/Nodes: There are no enlarged mediastinal, hilar or axillary lymph nodes. The thyroid gland is not clearly seen, consistent with prior resection or atrophy. The trachea and esophagus demonstrate no significant findings. The heart size is normal. There is no pericardial effusion.There is minimal aortic atherosclerosis.  Lungs/Pleura: There is no pleural effusion. There is mild subpleural nodular scarring at the right apex. There  is linear scarring atelectasis at both lung bases. No suspicious pulmonary nodules.  Upper abdomen:  Hepatic steatosis.  Otherwise unremarkable.  Musculoskeletal/Chest wall: There is no chest wall mass or suspicious osseous finding.  IMPRESSION: 1. No suspicious pulmonary nodule. 2. Scattered scarring in both lungs.  No acute chest findings. 3. Minimal atherosclerosis. 4. Mild hepatic steatosis.   Electronically Signed   By: Richardean Sale M.D.   On: 06/19/2014 19:18   Dg Hip Port Unilat With Pelvis 1v Right  06/19/2014   CLINICAL DATA:  Hip replacement.  EXAM: RIGHT HIP (WITH PELVIS) 1  VIEW PORTABLE  COMPARISON:  None.  FINDINGS: Patient status post total right hip replacement. Prostheses intact. No acute bony abnormality .  IMPRESSION: Right hip replacement.  No acute bony abnormality .   Electronically Signed   By: Marcello Moores  Register   On: 06/19/2014 13:23    Disposition: 06-Home-Health Care Svc      Discharge Instructions    Call MD / Call 911    Complete by:  As directed   If you experience chest pain or shortness of breath, CALL 911 and be transported to the hospital emergency room.  If you develope a fever above 101 F, pus (white drainage) or increased drainage or redness at the wound, or calf pain, call your surgeon's office.     Call MD / Call 911    Complete by:  As directed   If you experience chest pain or shortness of breath, CALL 911 and be transported to the hospital emergency room.  If you develope a fever above 101 F, pus (white drainage) or increased drainage or redness at the wound, or calf pain, call your surgeon's office.     Constipation Prevention    Complete by:  As directed   Drink plenty of fluids.  Prune juice may be helpful.  You may use a stool softener, such as Colace (over the counter) 100 mg twice a day.  Use MiraLax (over the counter) for constipation as needed.     Constipation Prevention    Complete by:  As directed   Drink plenty of fluids.  Prune juice may be  helpful.  You may use a stool softener, such as Colace (over the counter) 100 mg twice a day.  Use MiraLax (over the counter) for constipation as needed.     Diet - low sodium heart healthy    Complete by:  As directed      Diet - low sodium heart healthy    Complete by:  As directed      Discharge patient    Complete by:  As directed      Increase activity slowly as tolerated    Complete by:  As directed      Increase activity slowly as tolerated    Complete by:  As directed            Follow-up Information    Follow up with DUDA,MARCUS V, MD In 2 weeks.   Specialty:  Orthopedic Surgery   Contact information:   [place] [address] [phone]       Follow up with [facility]   Why:  They will contact you to schedule home therapy visits.   Contact information:   [address] [place] [address] [phone]       Follow up with DUDA,MARCUS V, MD In 2 weeks.   Specialty:  Orthopedic Surgery   Contact information:   [place] [address] [phone]        Signed: Mcarthur Rossetti 07/06/2014, 8:27 AM

## 2014-07-06 NOTE — Progress Notes (Signed)
Patient ID: Bianca Collins, female   DOB: 29-Feb-1946, 69 y.o.   MRN: [id number] Doing well.  H/H responded with transfusion.  Working well with therapy.  Can be discharged to home today.

## 2014-07-06 NOTE — Discharge Instructions (Signed)
Keep your right hip dressing clean and dry. Only tough down weight on your right leg.

## 2014-07-06 NOTE — Progress Notes (Signed)
PT Cancellation Note  Patient Details Name: Bianca Collins MRN: [id number] DOB: 1945/04/01   Cancelled Treatment:     Pt discharging home this AM. Declining PT Rx prior to leaving. Pt leaving upon PT arrival.   Lorriane Shire 07/06/2014, 10:30 AM

## 2014-07-08 ENCOUNTER — Encounter (HOSPITAL_COMMUNITY): Payer: Self-pay | Admitting: Orthopedic Surgery

## 2014-07-10 ENCOUNTER — Encounter: Payer: Self-pay | Admitting: Endocrinology

## 2014-07-10 ENCOUNTER — Encounter (HOSPITAL_COMMUNITY): Payer: Self-pay | Admitting: Orthopedic Surgery

## 2014-07-18 ENCOUNTER — Other Ambulatory Visit (HOSPITAL_COMMUNITY): Payer: Self-pay | Admitting: Orthopedic Surgery

## 2014-07-23 ENCOUNTER — Encounter (HOSPITAL_COMMUNITY): Payer: Self-pay | Admitting: *Deleted

## 2014-07-23 MED ORDER — CEFAZOLIN SODIUM-DEXTROSE 2-3 GM-% IV SOLR
2.0000 g | INTRAVENOUS | Status: AC
  Administered 2014-07-24: 2 g via INTRAVENOUS
  Filled 2014-07-23: qty 50

## 2014-07-24 ENCOUNTER — Ambulatory Visit (HOSPITAL_COMMUNITY): Payer: Medicare HMO | Admitting: Certified Registered Nurse Anesthetist

## 2014-07-24 ENCOUNTER — Ambulatory Visit (HOSPITAL_COMMUNITY): Payer: Medicare HMO

## 2014-07-24 ENCOUNTER — Encounter (HOSPITAL_COMMUNITY)
Admission: RE | Disposition: A | Discharge status: Home / Self Care | Payer: Self-pay | Source: Ambulatory Visit | Attending: Orthopedic Surgery

## 2014-07-24 ENCOUNTER — Encounter (HOSPITAL_COMMUNITY): Payer: Self-pay | Admitting: *Deleted

## 2014-07-24 ENCOUNTER — Observation Stay (HOSPITAL_COMMUNITY)
Admission: RE | Admit: 2014-07-24 | Discharge: 2014-07-26 | Disposition: A | Discharge status: Home / Self Care | Payer: Medicare HMO | Source: Ambulatory Visit | Attending: Orthopedic Surgery | Admitting: Orthopedic Surgery

## 2014-07-24 DIAGNOSIS — M1990 Unspecified osteoarthritis, unspecified site: Secondary | ICD-10-CM | POA: Insufficient documentation

## 2014-07-24 DIAGNOSIS — Z419 Encounter for procedure for purposes other than remedying health state, unspecified: Secondary | ICD-10-CM

## 2014-07-24 DIAGNOSIS — E039 Hypothyroidism, unspecified: Secondary | ICD-10-CM | POA: Diagnosis not present

## 2014-07-24 DIAGNOSIS — M96661 Fracture of femur following insertion of orthopedic implant, joint prosthesis, or bone plate, right leg: Secondary | ICD-10-CM | POA: Insufficient documentation

## 2014-07-24 DIAGNOSIS — Y838 Other surgical procedures as the cause of abnormal reaction of the patient, or of later complication, without mention of misadventure at the time of the procedure: Secondary | ICD-10-CM | POA: Diagnosis not present

## 2014-07-24 DIAGNOSIS — S7291XA Unspecified fracture of right femur, initial encounter for closed fracture: Secondary | ICD-10-CM | POA: Diagnosis present

## 2014-07-24 DIAGNOSIS — S72401A Unspecified fracture of lower end of right femur, initial encounter for closed fracture: Secondary | ICD-10-CM | POA: Diagnosis present

## 2014-07-24 DIAGNOSIS — Y9289 Other specified places as the place of occurrence of the external cause: Secondary | ICD-10-CM | POA: Diagnosis not present

## 2014-07-24 DIAGNOSIS — F17210 Nicotine dependence, cigarettes, uncomplicated: Secondary | ICD-10-CM | POA: Insufficient documentation

## 2014-07-24 DIAGNOSIS — T84040A Periprosthetic fracture around internal prosthetic right hip joint, initial encounter: Secondary | ICD-10-CM | POA: Diagnosis not present

## 2014-07-24 DIAGNOSIS — S7290XA Unspecified fracture of unspecified femur, initial encounter for closed fracture: Secondary | ICD-10-CM | POA: Diagnosis present

## 2014-07-24 HISTORY — PX: ORIF FEMUR FRACTURE: SHX2119

## 2014-07-24 LAB — CBC
HCT: 38.4 % (ref 36.0–46.0)
Hemoglobin: 12.2 g/dL (ref 12.0–15.0)
MCH: 30.5 pg (ref 26.0–34.0)
MCHC: 31.8 g/dL (ref 30.0–36.0)
MCV: 96 fL (ref 78.0–100.0)
Platelets: 355 10*3/uL (ref 150–400)
RBC: 4 MIL/uL (ref 3.87–5.11)
RDW: 14.3 % (ref 11.5–15.5)
WBC: 9 10*3/uL (ref 4.0–10.5)

## 2014-07-24 LAB — BASIC METABOLIC PANEL
Anion gap: 10 (ref 5–15)
BUN: 14 mg/dL (ref 6–20)
CALCIUM: 9.2 mg/dL (ref 8.9–10.3)
CO2: 26 mmol/L (ref 22–32)
Chloride: 101 mmol/L (ref 101–111)
Creatinine, Ser: 0.69 mg/dL (ref 0.44–1.00)
GFR calc Af Amer: >60 mL/min (ref >60)
GFR calc non Af Amer: >60 mL/min (ref >60)
GLUCOSE: 101 mg/dL — AB (ref 70–99)
POTASSIUM: 4.4 mmol/L (ref 3.5–5.1)
SODIUM: 137 mmol/L (ref 135–145)

## 2014-07-24 SURGERY — OPEN REDUCTION INTERNAL FIXATION (ORIF) DISTAL FEMUR FRACTURE
Anesthesia: Spinal | Laterality: Right

## 2014-07-24 MED ORDER — HYDROMORPHONE HCL 1 MG/ML IJ SOLN: 1.0000 mg | INTRAMUSCULAR | Status: DC | PRN

## 2014-07-24 MED ORDER — METOCLOPRAMIDE HCL 5 MG/ML IJ SOLN: 5.0000 mg | Freq: Three times a day (TID) | INTRAMUSCULAR | Status: DC | PRN

## 2014-07-24 MED ORDER — FENTANYL CITRATE (PF) 100 MCG/2ML IJ SOLN
INTRAMUSCULAR | Status: AC
  Administered 2014-07-24: 100 ug
  Filled 2014-07-24: qty 2

## 2014-07-24 MED ORDER — ESTRADIOL 1 MG PO TABS
1.0000 mg | ORAL_TABLET | Freq: Every day | ORAL | Status: DC
Start: 2014-07-24 — End: 2014-07-26
  Administered 2014-07-26: 1 mg via ORAL
  Filled 2014-07-24 (×3): qty 1

## 2014-07-24 MED ORDER — ONDANSETRON HCL 4 MG/2ML IJ SOLN: 4.0000 mg | Freq: Four times a day (QID) | INTRAMUSCULAR | Status: DC | PRN

## 2014-07-24 MED ORDER — OXYCODONE-ACETAMINOPHEN 5-325 MG PO TABS: 1.0000 | ORAL_TABLET | ORAL | Status: DC | PRN

## 2014-07-24 MED ORDER — PROPOFOL 10 MG/ML IV BOLUS
INTRAVENOUS | Status: DC | PRN
  Administered 2014-07-24: 20 mg via INTRAVENOUS

## 2014-07-24 MED ORDER — BUPIVACAINE HCL (PF) 0.25 % IJ SOLN
INTRAMUSCULAR | Status: AC
  Filled 2014-07-24: qty 30

## 2014-07-24 MED ORDER — SODIUM CHLORIDE 0.9 % IV SOLN
INTRAVENOUS | Status: DC
  Administered 2014-07-25: 01:00:00 via INTRAVENOUS

## 2014-07-24 MED ORDER — MIDAZOLAM HCL 2 MG/2ML IJ SOLN
INTRAMUSCULAR | Status: AC
  Filled 2014-07-24: qty 2

## 2014-07-24 MED ORDER — LEVOTHYROXINE SODIUM 125 MCG PO TABS
125.0000 ug | ORAL_TABLET | Freq: Every day | ORAL | Status: DC
  Administered 2014-07-26: 125 ug via ORAL
  Filled 2014-07-24 (×2): qty 1

## 2014-07-24 MED ORDER — OXYCODONE HCL 5 MG PO TABS
5.0000 mg | ORAL_TABLET | ORAL | Status: DC | PRN
  Administered 2014-07-24 (×2): 5 mg via ORAL
  Administered 2014-07-24: 10 mg via ORAL
  Filled 2014-07-24 (×2): qty 1
  Filled 2014-07-24: qty 2

## 2014-07-24 MED ORDER — FENTANYL CITRATE (PF) 250 MCG/5ML IJ SOLN
INTRAMUSCULAR | Status: AC
  Filled 2014-07-24: qty 5

## 2014-07-24 MED ORDER — MIDAZOLAM HCL 5 MG/5ML IJ SOLN
INTRAMUSCULAR | Status: DC | PRN
  Administered 2014-07-24 (×2): 1 mg via INTRAVENOUS

## 2014-07-24 MED ORDER — 0.9 % SODIUM CHLORIDE (POUR BTL) OPTIME
TOPICAL | Status: DC | PRN
  Administered 2014-07-24: 1000 mL

## 2014-07-24 MED ORDER — PROPOFOL 10 MG/ML IV BOLUS
INTRAVENOUS | Status: AC
  Filled 2014-07-24: qty 20

## 2014-07-24 MED ORDER — LACTATED RINGERS IV SOLN
INTRAVENOUS | Status: DC
Start: 2014-07-24 — End: 2014-07-24
  Administered 2014-07-24 (×2): via INTRAVENOUS

## 2014-07-24 MED ORDER — DEXTROSE 5 % IV SOLN
500.0000 mg | Freq: Four times a day (QID) | INTRAVENOUS | Status: DC | PRN
  Filled 2014-07-24: qty 5

## 2014-07-24 MED ORDER — METOCLOPRAMIDE HCL 5 MG PO TABS: 5.0000 mg | ORAL_TABLET | Freq: Three times a day (TID) | ORAL | Status: DC | PRN

## 2014-07-24 MED ORDER — METHOCARBAMOL 500 MG PO TABS
500.0000 mg | ORAL_TABLET | Freq: Four times a day (QID) | ORAL | Status: DC | PRN
  Administered 2014-07-24: 500 mg via ORAL
  Filled 2014-07-24: qty 1

## 2014-07-24 MED ORDER — EPHEDRINE SULFATE 50 MG/ML IJ SOLN
INTRAMUSCULAR | Status: DC | PRN
  Administered 2014-07-24 (×5): 5 mg via INTRAVENOUS

## 2014-07-24 MED ORDER — PNEUMOCOCCAL VAC POLYVALENT 25 MCG/0.5ML IJ INJ
0.5000 mL | INJECTION | INTRAMUSCULAR | Status: DC
  Filled 2014-07-24: qty 0.5

## 2014-07-24 MED ORDER — PHENYLEPHRINE HCL 10 MG/ML IJ SOLN
10.0000 mg | INTRAVENOUS | Status: DC | PRN
  Administered 2014-07-24: 20 ug/min via INTRAVENOUS

## 2014-07-24 MED ORDER — ACETAMINOPHEN 650 MG RE SUPP: 650.0000 mg | Freq: Four times a day (QID) | RECTAL | Status: DC | PRN

## 2014-07-24 MED ORDER — PROPOFOL INFUSION 10 MG/ML OPTIME
INTRAVENOUS | Status: DC | PRN
  Administered 2014-07-24: 50 ug/kg/min via INTRAVENOUS

## 2014-07-24 MED ORDER — ONDANSETRON HCL 4 MG PO TABS: 4.0000 mg | ORAL_TABLET | Freq: Four times a day (QID) | ORAL | Status: DC | PRN

## 2014-07-24 MED ORDER — CEFAZOLIN SODIUM 1-5 GM-% IV SOLN
1.0000 g | Freq: Four times a day (QID) | INTRAVENOUS | Status: AC
  Administered 2014-07-24 – 2014-07-25 (×3): 1 g via INTRAVENOUS
  Filled 2014-07-24 (×3): qty 50

## 2014-07-24 MED ORDER — ASPIRIN EC 325 MG PO TBEC
325.0000 mg | DELAYED_RELEASE_TABLET | Freq: Every day | ORAL | Status: DC
Start: 2014-07-24 — End: 2014-07-26
  Administered 2014-07-26: 325 mg via ORAL
  Filled 2014-07-24 (×2): qty 1

## 2014-07-24 MED ORDER — PHENYLEPHRINE HCL 10 MG/ML IJ SOLN
INTRAMUSCULAR | Status: DC | PRN
  Administered 2014-07-24: 80 ug via INTRAVENOUS
  Administered 2014-07-24: 240 ug via INTRAVENOUS
  Administered 2014-07-24: 80 ug via INTRAVENOUS
  Administered 2014-07-24: 40 ug via INTRAVENOUS
  Administered 2014-07-24 (×2): 80 ug via INTRAVENOUS

## 2014-07-24 MED ORDER — PHENYLEPHRINE HCL 10 MG/ML IJ SOLN
20.0000 mg | INTRAMUSCULAR | Status: DC | PRN
  Administered 2014-07-24: 50 ug/min via INTRAVENOUS

## 2014-07-24 MED ORDER — ACETAMINOPHEN 325 MG PO TABS
650.0000 mg | ORAL_TABLET | Freq: Four times a day (QID) | ORAL | Status: DC | PRN
Start: 2014-07-24 — End: 2014-07-26

## 2014-07-24 SURGICAL SUPPLY — 62 items
BIT DRILL 4.3 (BIT) ×1 IMPLANT
BLADE SURG 10 STRL SS (BLADE) ×2 IMPLANT
BLADE SURG ROTATE 9660 (MISCELLANEOUS) IMPLANT
BNDG COHESIVE 6X5 TAN STRL LF (GAUZE/BANDAGES/DRESSINGS) ×4 IMPLANT
BNDG GAUZE ELAST 4 BULKY (GAUZE/BANDAGES/DRESSINGS) ×2 IMPLANT
CABLE (Orthopedic Implant) ×4 IMPLANT
CAP LOCK NCB (Cap) ×2 IMPLANT
CLEANER TIP ELECTROSURG 2X2 (MISCELLANEOUS) IMPLANT
COVER MAYO STAND STRL (DRAPES) ×2 IMPLANT
COVER SURGICAL LIGHT HANDLE (MISCELLANEOUS) ×4 IMPLANT
CUFF TOURNIQUET SINGLE 34IN LL (TOURNIQUET CUFF) IMPLANT
CUFF TOURNIQUET SINGLE 44IN (TOURNIQUET CUFF) IMPLANT
DRAPE C-ARM 42X72 X-RAY (DRAPES) ×2 IMPLANT
DRAPE IMP U-DRAPE 54X76 (DRAPES) ×2 IMPLANT
DRAPE INCISE IOBAN 66X45 STRL (DRAPES) IMPLANT
DRAPE ORTHO SPLIT 77X108 STRL (DRAPES)
DRAPE SURG ORHT 6 SPLT 77X108 (DRAPES) IMPLANT
DRAPE U-SHAPE 47X51 STRL (DRAPES) ×2 IMPLANT
DRILL BIT 4.3 (BIT) ×1
DRSG ADAPTIC 3X8 NADH LF (GAUZE/BANDAGES/DRESSINGS) IMPLANT
DRSG MEPILEX BORDER 4X12 (GAUZE/BANDAGES/DRESSINGS) ×2 IMPLANT
DRSG PAD ABDOMINAL 8X10 ST (GAUZE/BANDAGES/DRESSINGS) IMPLANT
DURAPREP 26ML APPLICATOR (WOUND CARE) ×2 IMPLANT
ELECT REM PT RETURN 9FT ADLT (ELECTROSURGICAL) ×2
ELECTRODE REM PT RTRN 9FT ADLT (ELECTROSURGICAL) ×1 IMPLANT
EVACUATOR 1/8 PVC DRAIN (DRAIN) IMPLANT
GAUZE SPONGE 4X4 12PLY STRL (GAUZE/BANDAGES/DRESSINGS) IMPLANT
GLOVE BIOGEL PI IND STRL 9 (GLOVE) ×1 IMPLANT
GLOVE BIOGEL PI INDICATOR 9 (GLOVE) ×1
GLOVE SURG ORTHO 9.0 STRL STRW (GLOVE) ×4 IMPLANT
GOWN STRL REUS W/ TWL XL LVL3 (GOWN DISPOSABLE) ×1 IMPLANT
GOWN STRL REUS W/TWL XL LVL3 (GOWN DISPOSABLE) ×1
KIT BASIN OR (CUSTOM PROCEDURE TRAY) ×2 IMPLANT
KIT ROOM TURNOVER OR (KITS) ×2 IMPLANT
LOCKPLATE CABLE BUTTON NCP HIP (Orthopedic Implant) ×4 IMPLANT
MANIFOLD NEPTUNE II (INSTRUMENTS) ×2 IMPLANT
NCB LOCKING CAP (Orthopedic Implant) ×2 IMPLANT
NEEDLE 22X1 1/2 (OR ONLY) (NEEDLE) IMPLANT
NS IRRIG 1000ML POUR BTL (IV SOLUTION) ×2 IMPLANT
PACK ORTHO EXTREMITY (CUSTOM PROCEDURE TRAY) ×2 IMPLANT
PACK UNIVERSAL I (CUSTOM PROCEDURE TRAY) IMPLANT
PAD ARMBOARD 7.5X6 YLW CONV (MISCELLANEOUS) ×2 IMPLANT
PLATE FEMUR SHAFT NCB (Plate) ×2 IMPLANT
SCREW CORT NCB SELFTAP 5.0X42 (Screw) ×4 IMPLANT
SCREW CORTICAL NCB 5.0X40 (Screw) ×2 IMPLANT
SCREW CORTICAL NCB 5.0X44 (Screw) ×2 IMPLANT
SCREW NCB 5.0X38 (Screw) ×2 IMPLANT
SPONGE LAP 18X18 X RAY DECT (DISPOSABLE) ×2 IMPLANT
STAPLER VISISTAT 35W (STAPLE) IMPLANT
STOCKINETTE IMPERVIOUS LG (DRAPES) ×2 IMPLANT
SUCTION FRAZIER TIP 10 FR DISP (SUCTIONS) ×2 IMPLANT
SUT ETHILON 2 0 PSLX (SUTURE) IMPLANT
SUT ETHILON 4 0 PS 2 18 (SUTURE) IMPLANT
SUT VIC AB 0 CTB1 27 (SUTURE) ×2 IMPLANT
SUT VIC AB 1 CTB1 27 (SUTURE) ×2 IMPLANT
SUT VIC AB 2-0 CTB1 (SUTURE) ×2 IMPLANT
SYR 20ML ECCENTRIC (SYRINGE) IMPLANT
TOWEL OR 17X24 6PK STRL BLUE (TOWEL DISPOSABLE) ×2 IMPLANT
TOWEL OR 17X26 10 PK STRL BLUE (TOWEL DISPOSABLE) ×2 IMPLANT
TUBE CONNECTING 12X1/4 (SUCTIONS) IMPLANT
WATER STERILE IRR 1000ML POUR (IV SOLUTION) IMPLANT
YANKAUER SUCT BULB TIP NO VENT (SUCTIONS) ×2 IMPLANT

## 2014-07-24 NOTE — Op Note (Signed)
07/24/2014  11:23 AM  PATIENT:  Bianca Collins    PRE-OPERATIVE DIAGNOSIS:  Distal Right Femur Fracture  POST-OPERATIVE DIAGNOSIS:  Same  PROCEDURE:  OPEN REDUCTION INTERNAL FIXATION (ORIF) DISTAL FEMUR FRACTURE  SURGEON:  Newt Minion, MD  PHYSICIAN ASSISTANT:None ANESTHESIA:   General  PREOPERATIVE INDICATIONS:  Bianca Collins is a  69 y.o. female with a diagnosis of Distal Right Femur Fracture who failed conservative measures and elected for surgical management.    The risks benefits and alternatives were discussed with the patient preoperatively including but not limited to the risks of infection, bleeding, nerve injury, cardiopulmonary complications, the need for revision surgery, among others, and the patient was willing to proceed.  OPERATIVE IMPLANTS: Zimmer femoral plate 8 hole with 4 screws distally and 2 cables proximally  OPERATIVE FINDINGS: Spiral midshaft right femur fracture  OPERATIVE PROCEDURE: Patient was brought to the operating room after undergoing a femoral block she then underwent a spinal anesthetic. After adequate levels of anesthesia were obtained patient's right lower extremity was prepped using DuraPrep draped into a sterile field. A timeout was called. A lateral incision was made this was carried down through the tensor fascia lata. The vastus lateralis was elevated the fracture was identified the plate was centered over the fracture site C-arm fluoroscopy verified alignment. The plate was secured distally with one compression screw and then 2 cables were used proximally to align the plate and stabilize the alignment. Cable tighteners were used 3. The cable was secured through the plate. 4 compression screws were placed distally. C-arm fluoroscopy verified reduction of the alignment. The wound was irrigated with normal saline. The tensor fascia lata was closed using #1 Vicryl. Subcutaneous was closed using 0 Vicryl skin was closed using staples. A Mepilex dressing was  applied. Patient was taken to the PACU in stable condition. Patient wishes to be discharged to home patient most likely will stay until the spinal wears off.

## 2014-07-24 NOTE — H&P (Signed)
Bianca Collins is an 69 y.o. female.   Chief Complaint: Progressive fracture periprosthetic right hip total hip arthroplasty HPI: Patient is a 69 year old woman who underwent right hip total hip arthroplasty. Postoperatively she developed a periprosthetic fracture. She underwent open reduction internal fixation with cables. Postoperatively she was progressing well with therapy however repeat radiographs show progression of the fracture and patient presents at this time for open reduction internal fixation  Past Medical History  Diagnosis Date  . Tobacco abuse   . Thyroid disease   . Hypothyroidism   . Arthritis   . Hx of adenomatous polyp of colon 06/13/2014    Past Surgical History  Procedure Laterality Date  . Vaginal hysterectomy  2001    complete  . Rotator cuff repair  09-2013  . Total hip arthroplasty Right 06/19/2014    Procedure: TOTAL HIP ARTHROPLASTY;  Surgeon: Newt Minion, MD;  Location: [place];  Service: Orthopedics;  Laterality: Right;  . Orif periprosthetic fracture Right 07/02/2014    Procedure: Open Reduction Internal Fixation Femur Fracture, Revision Femur Total Hip Arthroplasty;  Surgeon: Newt Minion, MD;  Location: [place];  Service: Orthopedics;  Laterality: Right;    Family History  Problem Relation Age of Onset  . Colon cancer Neg Hx   . Thyroid disease Neg Hx   . Stomach cancer Neg Hx   . Diabetes type II Mother   . Hypertension Mother    Social History:  reports that she has been smoking Cigarettes.  She has a 15 pack-year smoking history. She has never used smokeless tobacco. She reports that she drinks about 8.4 oz of alcohol per week. She reports that she does not use illicit drugs.  Allergies: No Known Allergies  No prescriptions prior to admission    No results found for this or any previous visit (from the past 48 hour(s)). No results found.  Review of Systems  All other systems reviewed and are negative.   There were no vitals taken for this  visit. Physical Exam  On examination patient is asymptomatic however radiographs do show progression of the fracture of the right distal femur. Assessment/Plan Assessment: Progression of periprosthetic right femur fracture.  Plan: We'll plan for open reduction internal fixation with plate screws and cables. Risks and benefits were discussed patient states she understands wishes to proceed this time.  Conal Shetley V 07/24/2014, 7:16 AM

## 2014-07-24 NOTE — Anesthesia Postprocedure Evaluation (Signed)
  Anesthesia Post-op Note  Patient: Bianca Collins  Procedure(s) Performed: Procedure(s): OPEN REDUCTION INTERNAL FIXATION (ORIF) DISTAL FEMUR FRACTURE (Right)  Patient Location: PACU  Anesthesia Type:General  Level of Consciousness: awake  Airway and Oxygen Therapy: Patient Spontanous Breathing  Post-op Pain: mild  Post-op Assessment: Post-op Vital signs reviewed  Post-op Vital Signs: Reviewed  Last Vitals:  Filed Vitals:   07/24/14 1145  BP: 131/59  Pulse: 70  Temp:   Resp: 13    Complications: No apparent anesthesia complications

## 2014-07-24 NOTE — Transfer of Care (Addendum)
Immediate Anesthesia Transfer of Care Note  Patient: Bianca Collins  Procedure(s) Performed: Procedure(s): OPEN REDUCTION INTERNAL FIXATION (ORIF) DISTAL FEMUR FRACTURE (Right)  Patient Location: PACU  Anesthesia Type:MAC and Spinal  Level of Consciousness: awake, alert  and oriented  Airway & Oxygen Therapy: Patient Spontanous Breathing and Patient connected to nasal cannula oxygen  Post-op Assessment: Report given to RN and Post -op Vital signs reviewed and stable  Post vital signs: Reviewed and stable  Last Vitals:  Filed Vitals:   07/24/14 0757  BP: 109/67  Pulse: 73  Temp: 36.4 C  Resp: 16    Complications: No apparent anesthesia complications   BP 77 systolic 2/2 spinal anesthetic. Fluid bolus continues. Neo gtt started in PACU. VSS. BP increased to 86 systolic.  Pt continues to be alert and oriented.

## 2014-07-24 NOTE — Anesthesia Preprocedure Evaluation (Addendum)
Anesthesia Evaluation  Patient identified by MRN, date of birth, ID band Patient awake    Reviewed: Allergy & Precautions, NPO status , Patient's Chart, lab work & pertinent test results  Airway Mallampati: II  TM Distance: >3 FB Neck ROM: Full    Dental no notable dental hx.    Pulmonary neg pulmonary ROS,  breath sounds clear to auscultation  Pulmonary exam normal       Cardiovascular negative cardio ROS Normal cardiovascular examRhythm:Regular Rate:Normal     Neuro/Psych negative neurological ROS  negative psych ROS   GI/Hepatic negative GI ROS, Neg liver ROS,   Endo/Other  Hypothyroidism   Renal/GU negative Renal ROS     Musculoskeletal  (+) Arthritis -,   Abdominal   Peds  Hematology negative hematology ROS (+)   Anesthesia Other Findings   Reproductive/Obstetrics negative OB ROS                             Anesthesia Physical  Anesthesia Plan  ASA: II  Anesthesia Plan:    Post-op Pain Management:    Induction: Intravenous  Airway Management Planned:   Additional Equipment:   Intra-op Plan:   Post-operative Plan: Extubation in OR  Informed Consent: I have reviewed the patients History and Physical, chart, labs and discussed the procedure including the risks, benefits and alternatives for the proposed anesthesia with the patient or authorized representative who has indicated his/her understanding and acceptance.   Dental advisory given  Plan Discussed with: CRNA, Surgeon and Anesthesiologist  Anesthesia Plan Comments:         Anesthesia Quick Evaluation                                  Anesthesia Evaluation  Patient identified by MRN, date of birth, ID band Patient awake    Reviewed: Allergy & Precautions, NPO status , Patient's Chart, lab work & pertinent test results  Airway Mallampati: II  TM Distance: >3 FB Neck ROM: Full    Dental no notable  dental hx.    Pulmonary Current Smoker,  breath sounds clear to auscultation  Pulmonary exam normal       Cardiovascular negative cardio ROS  Rhythm:Regular Rate:Normal     Neuro/Psych negative neurological ROS  negative psych ROS   GI/Hepatic negative GI ROS, Neg liver ROS,   Endo/Other  Hypothyroidism   Renal/GU negative Renal ROS     Musculoskeletal  (+) Arthritis -,   Abdominal   Peds  Hematology negative hematology ROS (+)   Anesthesia Other Findings   Reproductive/Obstetrics negative OB ROS                             Anesthesia Physical Anesthesia Plan  ASA: II  Anesthesia Plan: Spinal   Post-op Pain Management:    Induction:   Airway Management Planned:   Additional Equipment:   Intra-op Plan:   Post-operative Plan:   Informed Consent: I have reviewed the patients History and Physical, chart, labs and discussed the procedure including the risks, benefits and alternatives for the proposed anesthesia with the patient or authorized representative who has indicated his/her understanding and acceptance.   Dental advisory given  Plan Discussed with: CRNA  Anesthesia Plan Comments:         Anesthesia Quick Evaluation  Anesthesia Evaluation  Patient identified by MRN, date of birth, ID band Patient awake    Reviewed: Allergy & Precautions, NPO status , Patient's Chart, lab work & pertinent test results  Airway Mallampati: II  TM Distance: >3 FB Neck ROM: Full    Dental no notable dental hx.    Pulmonary Current Smoker,  breath sounds clear to auscultation  Pulmonary exam normal       Cardiovascular negative cardio ROS  Rhythm:Regular Rate:Normal     Neuro/Psych negative neurological ROS  negative psych ROS   GI/Hepatic negative GI ROS, Neg liver ROS,   Endo/Other  Hypothyroidism   Renal/GU negative Renal ROS     Musculoskeletal  (+)  Arthritis -,   Abdominal   Peds  Hematology negative hematology ROS (+)   Anesthesia Other Findings   Reproductive/Obstetrics negative OB ROS                             Anesthesia Physical Anesthesia Plan  ASA: II  Anesthesia Plan: Spinal   Post-op Pain Management:    Induction:   Airway Management Planned:   Additional Equipment:   Intra-op Plan:   Post-operative Plan:   Informed Consent: I have reviewed the patients History and Physical, chart, labs and discussed the procedure including the risks, benefits and alternatives for the proposed anesthesia with the patient or authorized representative who has indicated his/her understanding and acceptance.   Dental advisory given  Plan Discussed with: CRNA  Anesthesia Plan Comments:         Anesthesia Quick Evaluation

## 2014-07-24 NOTE — Anesthesia Procedure Notes (Signed)
Spinal Patient location during procedure: OR Staffing Anesthesiologist: Nolon Nations Performed by: anesthesiologist  Preanesthetic Checklist Completed: patient identified, site marked, surgical consent, pre-op evaluation, timeout performed, IV checked, risks and benefits discussed and monitors and equipment checked Spinal Block Patient position: sitting Prep: Betadine Patient monitoring: heart rate, continuous pulse ox and blood pressure Approach: right paramedian Location: L3-4 Injection technique: single-shot Needle Needle type: Sprotte  Needle gauge: 24 G Needle length: 9 cm Assessment Sensory level: T8 Additional Notes Expiration date of kit checked and confirmed. Patient tolerated procedure well, without complications.    Anesthesia Regional Block:  Femoral nerve block  Pre-Anesthetic Checklist: ,, timeout performed, Correct Patient, Correct Site, Correct Laterality, Correct Procedure, Correct Position, site marked, Risks and benefits discussed, Surgical consent,  Pre-op evaluation,  Post-op pain management  Laterality: Right  Prep: chloraprep       Needles:  Injection technique: Single-shot  Needle Type: Stimiplex     Needle Length: 9cm 9 cm Needle Gauge: 21 and 21 G    Additional Needles:  Procedures: ultrasound guided (picture in chart) and nerve stimulator Femoral nerve block  Nerve Stimulator or Paresthesia:  Response: patellar snap, 0.6 mA,   Additional Responses:   Narrative:  Injection made incrementally with aspirations every 5 mL.  Performed by: Personally  Anesthesiologist: Nolon Nations  Additional Notes: BP cuff, EKG monitors applied. Sedation begun. Artery and nerve location verified with U/S and anesthetic injected incrementally, slowly, and after negative aspirations under direct u/s guidance. Good fascial /perineural spread. Tolerated well.

## 2014-07-25 DIAGNOSIS — T84040A Periprosthetic fracture around internal prosthetic right hip joint, initial encounter: Secondary | ICD-10-CM | POA: Diagnosis not present

## 2014-07-25 DIAGNOSIS — S7291XA Unspecified fracture of right femur, initial encounter for closed fracture: Secondary | ICD-10-CM | POA: Diagnosis present

## 2014-07-25 NOTE — Progress Notes (Signed)
Offered Pt oral care and bath. Pt refused ans stated she will take care of personal needs once she arrives home.

## 2014-07-25 NOTE — Progress Notes (Signed)
PT Cancellation Note  Patient Details Name: Bianca Collins MRN: [id number] DOB: [birth year]   Cancelled Treatment:    Reason Eval/Treat Not Completed: Other (comment) (Awaiting clarification on pt's updated WB status ).  Left message for Dr. Sharol Given regarding clarification on WB status (TDWB and WBAT both in orders).  Will complete evaluation when appropriate and PT will continue to follow pt acutely.  Thank you for this order.  Joslyn Hy PT, DPT [phone] Pager: [phone] 07/25/2014, 11:18 AM

## 2014-07-25 NOTE — Evaluation (Signed)
Physical Therapy Evaluation Patient Details Name: Bianca Collins MRN: [id number] DOB: 1945-10-13 Today's Date: 07/25/2014   History of Present Illness  69 y.o. female admitted to [facility] on 07/02/14 from ortho Md's office where, at her post op follow up she was found to have a R periprosthetic fx on f/u x-rays.  Pt underwent revision of R THA with posterior precuations and is now TDWB post op.  Pt with significant PMHx of R THA on 06/19/14, hypothyroidism, and rotator cuff repair.  Post op complication of ABL, requiring blood transfusion.    Clinical Impression  Pt admitted with above diagnosis. Pt currently with functional limitations due to the deficits listed below (see PT Problem List).  Session limited 2/2 dizziness.  Pt ambulated 20 ft to [facility] and back to chair and was able to adhere to WB status.  Patient needs to practice stairs next session.  Pt will benefit from skilled PT to increase their independence and safety with mobility to allow discharge to the venue listed below.  Pt's son, daughter in law and granddaughter will be available for assist intermittently as they work/attend school.    Follow Up Recommendations Home health PT;Supervision - Intermittent    Equipment Recommendations  None recommended by PT    Recommendations for Other Services       Precautions / Restrictions Precautions Precautions: Fall;Posterior Hip (No post prec orders but pt w/ recent THA w/ post prec) Precaution Comments: Reviewed post hip precautions Restrictions Weight Bearing Restrictions: Yes RLE Weight Bearing: Touchdown weight bearing      Mobility  Bed Mobility Overal bed mobility: Needs Assistance Bed Mobility: Supine to Sit     Supine to sit: Min assist     General bed mobility comments: Min assist for managing RLE OOB 2/2 pain.  Mod use of bed rails, increased time, cues for proper sequencing.  Transfers Overall transfer level: Needs assistance Equipment used: Rolling walker (2  wheeled) Transfers: Sit to/from Stand Sit to Stand: Min guard         General transfer comment: Min guard as pt has TDWB orders.  verbal cues for technique to prevent WB on RLE, pt able to adhere to WB status.  Ambulation/Gait Ambulation/Gait assistance: Min guard Ambulation Distance (Feet): 20 Feet Assistive device: Rolling walker (2 wheeled) Gait Pattern/deviations: Step-to pattern;Antalgic;Trunk flexed;Decreased weight shift to right   Gait velocity interpretation: Below normal speed for age/gender General Gait Details: Cues for technique to prevent WB on RLE, pt able to adhere.  Pt reports fatigue and dizziness while on BSC and ambulating and requests to sit down.  Stairs            Wheelchair Mobility    Modified Rankin (Stroke Patients Only)       Balance Overall balance assessment: Needs assistance Sitting-balance support: No upper extremity supported;Feet supported Sitting balance-Leahy Scale: Good     Standing balance support: Bilateral upper extremity supported;During functional activity Standing balance-Leahy Scale: Poor                               Pertinent Vitals/Pain Pain Assessment: 0-10 Pain Score: 5  Pain Location: R hip Pain Descriptors / Indicators: Sharp (sharp when sitting on BSC) Pain Intervention(s): Limited activity within patient's tolerance;Monitored during session;Repositioned    Home Living Family/patient expects to be discharged to:: Private residence Living Arrangements: Children (son in Sports coach, daughter, granddaughter) Available Help at Discharge: Family;Available PRN/intermittently Type of Home: [place]  Access: Stairs to enter Entrance Stairs-Rails: None Entrance Stairs-Number of Steps: 2 Home Layout: One level Home Equipment: Walker - 2 wheels;Walker - 4 wheels;Cane - single point;Bedside commode;Wheelchair - manual      Prior Function Level of Independence: Independent with assistive device(s) (using RW at  all times)         Comments:  (Pt will be continuing w/ HHPT once she returns home)     Hand Dominance   Dominant Hand: Right    Extremity/Trunk Assessment               Lower Extremity Assessment: RLE deficits/detail RLE Deficits / Details: weakness and limited ROM as expected s/p surgery    Cervical / Trunk Assessment: Normal  Communication   Communication: No difficulties  Cognition Arousal/Alertness: Awake/alert Behavior During Therapy: WFL for tasks assessed/performed Overall Cognitive Status: Within Functional Limits for tasks assessed                      General Comments General comments (skin integrity, edema, etc.): Session limited by pt's report of dizziness once sitting on BSC which imnproves once sitting in recliner chair at end of session.      Exercises Total Joint Exercises Ankle Circles/Pumps: AROM;Both;10 reps;Supine Quad Sets: AROM;Right;10 reps;Supine Long Arc Quad: AROM;Right;5 reps;Seated      Assessment/Plan    PT Assessment Patient needs continued PT services  PT Diagnosis Difficulty walking;Generalized weakness;Abnormality of gait;Acute pain   PT Problem List Decreased strength;Decreased range of motion;Decreased activity tolerance;Decreased balance;Decreased mobility;Decreased knowledge of use of DME;Decreased knowledge of precautions;Pain  PT Treatment Interventions Gait training;DME instruction;Stair training;Functional mobility training;Therapeutic activities;Therapeutic exercise;Balance training;Neuromuscular re-education;Patient/family education;Modalities   PT Goals (Current goals can be found in the Care Plan section) Acute Rehab PT Goals Patient Stated Goal: to go home tomorrow.  PT Goal Formulation: With patient Time For Goal Achievement: 08/01/14 Potential to Achieve Goals: Good    Frequency 7X/week   Barriers to discharge Inaccessible home environment;Decreased caregiver support 2 steps to enter w/o rails,  intermittent assist    Co-evaluation               End of Session Equipment Utilized During Treatment: Gait belt Activity Tolerance: Treatment limited secondary to medical complications (Comment) (dizziness) Patient left: in chair;with call bell/phone within reach Nurse Communication: Mobility status;Precautions;Weight bearing status    Functional Assessment Tool Used: clinical judgement Functional Limitation: Mobility: Walking and moving around Mobility: Walking and Moving Around Current Status (T9030): At least 40 percent but less than 60 percent impaired, limited or restricted Mobility: Walking and Moving Around Goal Status [phone]): At least 20 percent but less than 40 percent impaired, limited or restricted    Time: 0076-2263 PT Time Calculation (min) (ACUTE ONLY): 18 min   Charges:   PT Evaluation $Initial PT Evaluation Tier I: 1 Procedure     PT G Codes:   PT G-Codes **NOT FOR INPATIENT CLASS** Functional Assessment Tool Used: clinical judgement Functional Limitation: Mobility: Walking and moving around Mobility: Walking and Moving Around Current Status (F3545): At least 40 percent but less than 60 percent impaired, limited or restricted Mobility: Walking and Moving Around Goal Status [phone]): At least 20 percent but less than 40 percent impaired, limited or restricted    Joslyn Hy PT, DPT [phone] Pager: [phone] 07/25/2014, 3:27 PM

## 2014-07-25 NOTE — Discharge Summary (Signed)
Physician Discharge Summary  Patient ID: Bianca Collins MRN: [id number] DOB/AGE: [birth year] 69 y.o.  Admit date: 07/24/2014 Discharge date: 07/25/2014  Admission Diagnoses: Right femur fracture closed  Discharge Diagnoses:  Active Problems:   Closed femur fracture   Femur fracture, right   Discharged Condition: stable  Hospital Course: Patient's hospital course was essentially unremarkable. She underwent open reduction internal fixation with plate cables and screws of the right femur fracture. Postoperatively patient progressed well and was discharged to home in stable condition.  Consults: None  Significant Diagnostic Studies: labs: Routine labs  Treatments: surgery: See operative note  Discharge Exam: Blood pressure 102/61, pulse 100, temperature 98.5 F (36.9 C), temperature source Oral, resp. rate 18, height 5\' 6"  (1.676 m), weight 81.647 kg (180 lb), SpO2 92 %. Incision/Wound: dressing clean dry and intact  Disposition: 01-Home or Self Care  Discharge Instructions    Call MD / Call 911    Complete by:  As directed   If you experience chest pain or shortness of breath, CALL 911 and be transported to the hospital emergency room.  If you develope a fever above 101 F, pus (white drainage) or increased drainage or redness at the wound, or calf pain, call your surgeon's office.     Constipation Prevention    Complete by:  As directed   Drink plenty of fluids.  Prune juice may be helpful.  You may use a stool softener, such as Colace (over the counter) 100 mg twice a day.  Use MiraLax (over the counter) for constipation as needed.     Diet - low sodium heart healthy    Complete by:  As directed      Increase activity slowly as tolerated    Complete by:  As directed      Weight bearing as tolerated    Complete by:  As directed   Laterality:  right  Extremity:  Lower            Medication List    TAKE these medications        aspirin EC 325 MG tablet  Take 1 tablet (325  mg total) by mouth daily.     estradiol 1 MG tablet  Commonly known as:  ESTRACE  Take 1 mg by mouth daily.     levothyroxine 125 MCG tablet  Commonly known as:  SYNTHROID, LEVOTHROID  Take 1 tablet (125 mcg total) by mouth daily before breakfast.     oxyCODONE-acetaminophen 5-325 MG per tablet  Commonly known as:  ROXICET  Take 1 tablet by mouth every 4 (four) hours as needed for severe pain.           Follow-up Information    Follow up with Karlene Southard V, MD In 1 week.   Specialty:  Orthopedic Surgery   Contact information:   [place] [address] [phone]       Signed: Newt Minion 07/25/2014, 6:41 AM

## 2014-07-26 DIAGNOSIS — T84040A Periprosthetic fracture around internal prosthetic right hip joint, initial encounter: Secondary | ICD-10-CM | POA: Diagnosis not present

## 2014-07-26 NOTE — Discharge Summary (Signed)
  Patient's discharge has been delayed secondary to her being dizzy with attempted ambulation. Patient states she is feeling much better today and anticipate discharge to home after physical therapy today. Patient's discharge was delayed due to her risk of fall and injury.

## 2014-07-26 NOTE — Progress Notes (Signed)
Physical Therapy Treatment Patient Details Name: Bianca Collins MRN: [id number] DOB: 01-17-1946 Today's Date: 07/26/2014    History of Present Illness 69 y.o. female admitted to [facility] on 07/02/14 from ortho Md's office where, at her post op follow up she was found to have a R periprosthetic fx on f/u x-rays.  Pt underwent revision of R THA with posterior precuations and is now TDWB post op.  Pt with significant PMHx of R THA on 06/19/14, hypothyroidism, and rotator cuff repair.  Post op complication of ABL, requiring blood transfusion.      PT Comments    Pt is making progress toward goals and increasing functional independence. Able to slightly progress ambulation and practice stairs this AM. Pt feels comfortable with going up/down steps at home and able to maintain TDWB status for all functional activities. Pt safe to D/C from a mobility standpoint based on progression toward goals set on PT eval.  Follow Up Recommendations  Home health PT;Supervision - Intermittent     Equipment Recommendations  None recommended by PT    Recommendations for Other Services       Precautions / Restrictions Precautions Precautions: Posterior Hip;Fall Precaution Comments: Pt able to recall 2/3 precautions. Cues to recall hip rotation precaution. Restrictions Weight Bearing Restrictions: Yes RLE Weight Bearing: Touchdown weight bearing    Mobility  Bed Mobility Overal bed mobility: Needs Assistance Bed Mobility: Supine to Sit     Supine to sit: Min assist     General bed mobility comments: Min A to move RLE to EOB.  Transfers Overall transfer level: Needs assistance Equipment used: Rolling walker (2 wheeled) Transfers: Sit to/from Stand Sit to Stand: Min guard         General transfer comment: Min guard for safety. Cues for hand placement. Pt able to adhere to TDWB status on RLE.  Ambulation/Gait Ambulation/Gait assistance: Min guard Ambulation Distance (Feet): 30 Feet Assistive device:  Rolling walker (2 wheeled) Gait Pattern/deviations: Step-to pattern   Gait velocity interpretation: Below normal speed for age/gender General Gait Details: Pt able to adhere to TDWB status on RLE and ambulate with hop-to pattern.   Stairs Stairs: Yes Stairs assistance: Min assist Stair Management: No rails;Step to pattern;Backwards;With walker Number of Stairs: 2 General stair comments: Pt educated on hop-to technique to maintain TDWB status on RLE. Pt able to go up/down stairs with safe technique and adhere to TDWB status on RLE.  Wheelchair Mobility    Modified Rankin (Stroke Patients Only)       Balance                                    Cognition Arousal/Alertness: Awake/alert Behavior During Therapy: WFL for tasks assessed/performed Overall Cognitive Status: Within Functional Limits for tasks assessed                      Exercises Total Joint Exercises Ankle Circles/Pumps: AROM;Both;10 reps Quad Sets: AROM;Both;5 reps Heel Slides: AAROM;Right;5 reps Hip ABduction/ADduction: AAROM;Right;10 reps Long Arc Quad: AAROM;Right;5 reps    General Comments        Pertinent Vitals/Pain Pain Assessment: No/denies pain (Pt denies pain, just stiffness in R hip.) Pain Intervention(s): Monitored during session;Repositioned    Home Living                      Prior Function  PT Goals (current goals can now be found in the care plan section) Progress towards PT goals: Progressing toward goals    Frequency  7X/week    PT Plan Current plan remains appropriate    Co-evaluation             End of Session Equipment Utilized During Treatment: Gait belt Activity Tolerance: Patient limited by fatigue Patient left: in chair;with call bell/phone within reach     Time: 0802-0834 PT Time Calculation (min) (ACUTE ONLY): 32 min  Charges:                       G CodesRubye Oaks, [place] 07/26/2014, 9:23 AM

## 2014-07-30 ENCOUNTER — Encounter (HOSPITAL_COMMUNITY): Payer: Self-pay | Admitting: Orthopedic Surgery

## 2014-12-24 ENCOUNTER — Emergency Department (INDEPENDENT_AMBULATORY_CARE_PROVIDER_SITE_OTHER)
Admission: EM | Admit: 2014-12-24 | Discharge: 2014-12-24 | Disposition: A | Discharge status: Home / Self Care | Payer: Medicare HMO | Source: Home / Self Care | Attending: Family Medicine | Admitting: Family Medicine

## 2014-12-24 ENCOUNTER — Encounter (HOSPITAL_COMMUNITY): Payer: Self-pay | Admitting: Emergency Medicine

## 2014-12-24 DIAGNOSIS — H6123 Impacted cerumen, bilateral: Secondary | ICD-10-CM | POA: Diagnosis not present

## 2014-12-24 NOTE — Discharge Instructions (Signed)
It was a pleasure to see you today.   We irrigated both ears to remove ear wax.   It is recommended that you use hydrogen peroxide at room temperature, in both ears periodically to remove ear wax.  Instill the hydrogen peroxide at bedtime, allow to sit and break up the wax.    Cerumen Impaction A cerumen impaction is when the wax in your ear forms a plug. This plug usually causes reduced hearing. Sometimes it also causes an earache or dizziness. Removing a cerumen impaction can be difficult and painful. The wax sticks to the ear canal. The canal is sensitive and bleeds easily. If you try to remove a heavy wax buildup with a cotton tipped swab, you may push it in further. Irrigation with water, suction, and small ear curettes may be used to clear out the wax. If the impaction is fixed to the skin in the ear canal, ear drops may be needed for a few days to loosen the wax. People who build up a lot of wax frequently can use ear wax removal products available in your local drugstore. SEEK MEDICAL CARE IF:  You develop an earache, increased hearing loss, or marked dizziness. Document Released: 04/15/2004 Document Revised: 05/31/2011 Document Reviewed: 06/05/2009 [facility] Patient Information 2015 [place], [place]. This information is not intended to replace advice given to you by your health care provider. Make sure you discuss any questions you have with your health care provider.

## 2014-12-24 NOTE — ED Provider Notes (Signed)
CSN: [id number]     Arrival date & time 12/24/14  1306 History   First MD Initiated Contact with Patient 12/24/14 1345     No chief complaint on file.  (Consider location/radiation/quality/duration/timing/severity/associated sxs/prior Treatment) The history is provided by the patient. No language interpreter was used.  Patient presents for complaint of bilateral ear wax resulting in muffled sounds, developing over the past 5 to 8 days. No pain, no otorrhea, no fevers or chills. Had similar episode 12 to 15 years ago.  Has used H2O2 weekly for general hygiene of ears, more frequently in the past week.   Past Medical History  Diagnosis Date  . Tobacco abuse   . Thyroid disease   . Hypothyroidism   . Arthritis   . Hx of adenomatous polyp of colon 06/13/2014   Past Surgical History  Procedure Laterality Date  . Vaginal hysterectomy  2001    complete  . Rotator cuff repair  09-2013  . Total hip arthroplasty Right 06/19/2014    Procedure: TOTAL HIP ARTHROPLASTY;  Surgeon: Newt Minion, MD;  Location: [place];  Service: Orthopedics;  Laterality: Right;  . Orif periprosthetic fracture Right 07/02/2014    Procedure: Open Reduction Internal Fixation Femur Fracture, Revision Femur Total Hip Arthroplasty;  Surgeon: Newt Minion, MD;  Location: [place];  Service: Orthopedics;  Laterality: Right;  . Orif femur fracture Right 07/24/2014    Procedure: OPEN REDUCTION INTERNAL FIXATION (ORIF) DISTAL FEMUR FRACTURE;  Surgeon: Newt Minion, MD;  Location: [place];  Service: Orthopedics;  Laterality: Right;   Family History  Problem Relation Age of Onset  . Colon cancer Neg Hx   . Thyroid disease Neg Hx   . Stomach cancer Neg Hx   . Diabetes type II Mother   . Hypertension Mother    Social History  Substance Use Topics  . Smoking status: Current Every Day Smoker -- 0.50 packs/day for 30 years    Types: Cigarettes  . Smokeless tobacco: Never Used     Comment: Patient reports that she is gradually trying  to quit  . Alcohol Use: 8.4 oz/week    14 Glasses of wine per week   OB History    No data available     Review of Systems  Constitutional: Negative for fever, chills, activity change, appetite change and fatigue.  HENT: Positive for congestion and hearing loss. Negative for drooling, ear discharge, ear pain, postnasal drip, rhinorrhea, sinus pressure, sore throat, tinnitus and trouble swallowing.   Eyes: Negative for discharge, itching and visual disturbance.  Respiratory: Negative for shortness of breath.     Allergies  Review of patient's allergies indicates no known allergies.  Home Medications   Prior to Admission medications   Medication Sig Start Date End Date Taking? Authorizing Provider  aspirin EC 325 MG tablet Take 1 tablet (325 mg total) by mouth daily. 06/21/14   Newt Minion, MD  estradiol (ESTRACE) 1 MG tablet Take 1 mg by mouth daily.    Historical Provider, MD  levothyroxine (SYNTHROID, LEVOTHROID) 125 MCG tablet Take 1 tablet (125 mcg total) by mouth daily before breakfast. 06/05/14   Renato Shin, MD  oxyCODONE-acetaminophen (ROXICET) 5-325 MG per tablet Take 1 tablet by mouth every 4 (four) hours as needed for severe pain. 06/21/14   Newt Minion, MD   Meds Ordered and Administered this Visit  Medications - No data to display  BP 118/54 mmHg  Pulse 67  Temp(Src) 98 F (36.7 C) (Oral)  Resp 18  SpO2 98% No data found.   Physical Exam  Constitutional: She appears well-developed and well-nourished. No distress.  HENT:  Head: Normocephalic and atraumatic.  Bilateral EAC blocked by copious cerumen.  Unable to visualize TMs. No tenderness to palpate pinna and tragus bilaterally.   Eyes: Conjunctivae and EOM are normal. Pupils are equal, round, and reactive to light. Right eye exhibits no discharge. Left eye exhibits no discharge. No scleral icterus.  Neck: Normal range of motion. Neck supple. No tracheal deviation present. No thyromegaly present.   Lymphadenopathy:    She has no cervical adenopathy.  Skin: She is not diaphoretic.    Exam after irrigation reveals bilat normal TMs.   ED Course  Procedures (including critical care time)  Labs Review Labs Reviewed - No data to display  Imaging Review No results found.   Visual Acuity Review  Right Eye Distance:   Left Eye Distance:   Bilateral Distance:    Right Eye Near:   Left Eye Near:    Bilateral Near:         MDM   1. Cerumen impaction, bilateral        Willeen Niece, MD 12/24/14 1440

## 2014-12-24 NOTE — ED Notes (Signed)
Ears are stopped up.  Onset 8 days

## 2015-02-14 ENCOUNTER — Emergency Department (INDEPENDENT_AMBULATORY_CARE_PROVIDER_SITE_OTHER)
Admission: EM | Admit: 2015-02-14 | Discharge: 2015-02-14 | Disposition: A | Discharge status: Home / Self Care | Payer: Medicare HMO | Source: Home / Self Care

## 2015-02-14 ENCOUNTER — Encounter (HOSPITAL_COMMUNITY): Payer: Self-pay | Admitting: Emergency Medicine

## 2015-02-14 DIAGNOSIS — N390 Urinary tract infection, site not specified: Secondary | ICD-10-CM

## 2015-02-14 LAB — POCT URINALYSIS DIP (DEVICE)
Bilirubin Urine: NEGATIVE
Glucose, UA: NEGATIVE mg/dL
Ketones, ur: NEGATIVE mg/dL
Leukocytes, UA: NEGATIVE
NITRITE: NEGATIVE
Protein, ur: NEGATIVE mg/dL
Specific Gravity, Urine: >=1.03 (ref 1.005–1.030)
Urobilinogen, UA: 0.2 mg/dL (ref 0.0–1.0)
pH: 6 (ref 5.0–8.0)

## 2015-02-14 MED ORDER — SULFAMETHOXAZOLE-TRIMETHOPRIM 800-160 MG PO TABS: 1.0000 | ORAL_TABLET | Freq: Two times a day (BID) | ORAL | Status: AC

## 2015-02-14 MED ORDER — PHENAZOPYRIDINE HCL 200 MG PO TABS: 200.0000 mg | ORAL_TABLET | Freq: Three times a day (TID) | ORAL | Status: DC

## 2015-02-14 NOTE — ED Provider Notes (Signed)
CSN: [id number]     Arrival date & time 02/14/15  1310 History   None    Chief Complaint  Patient presents with  . Back Pain   (Consider location/radiation/quality/duration/timing/severity/associated sxs/prior Treatment) HPI History obtained from patient:   LOCATION:right flank SEVERITY:3 DURATION:6 days CONTEXT:sudden onset QUALITY:similar to previous "kidney infections' MODIFYING FACTORS:none ASSOCIATED SYMPTOMS:back pain TIMING:constant OCCUPATION:  Past Medical History  Diagnosis Date  . Tobacco abuse   . Thyroid disease   . Hypothyroidism   . Arthritis   . Hx of adenomatous polyp of colon 06/13/2014   Past Surgical History  Procedure Laterality Date  . Vaginal hysterectomy  2001    complete  . Rotator cuff repair  09-2013  . Total hip arthroplasty Right 06/19/2014    Procedure: TOTAL HIP ARTHROPLASTY;  Surgeon: Newt Minion, MD;  Location: [facility];  Service: Orthopedics;  Laterality: Right;  . Orif periprosthetic fracture Right 07/02/2014    Procedure: Open Reduction Internal Fixation Femur Fracture, Revision Femur Total Hip Arthroplasty;  Surgeon: Newt Minion, MD;  Location: [facility];  Service: Orthopedics;  Laterality: Right;  . Orif femur fracture Right 07/24/2014    Procedure: OPEN REDUCTION INTERNAL FIXATION (ORIF) DISTAL FEMUR FRACTURE;  Surgeon: Newt Minion, MD;  Location: [facility];  Service: Orthopedics;  Laterality: Right;   Family History  Problem Relation Age of Onset  . Colon cancer Neg Hx   . Thyroid disease Neg Hx   . Stomach cancer Neg Hx   . Diabetes type II Mother   . Hypertension Mother    Social History  Substance Use Topics  . Smoking status: Current Every Day Smoker -- 0.50 packs/day for 30 years    Types: Cigarettes  . Smokeless tobacco: Never Used     Comment: Patient reports that she is gradually trying to quit  . Alcohol Use: 8.4 oz/week    14 Glasses of wine per week   OB History    No data available     Review of Systems ROS +'ve  flank pain right side  Denies: HEADACHE, NAUSEA, ABDOMINAL PAIN, CHEST PAIN, CONGESTION, DYSURIA, SHORTNESS OF BREATH  Allergies  Review of patient's allergies indicates no known allergies.  Home Medications   Prior to Admission medications   Medication Sig Start Date End Date Taking? Authorizing Provider  aspirin EC 325 MG tablet Take 1 tablet (325 mg total) by mouth daily. 06/21/14   Newt Minion, MD  estradiol (ESTRACE) 1 MG tablet Take 1 mg by mouth daily.    Historical Provider, MD  levothyroxine (SYNTHROID, LEVOTHROID) 125 MCG tablet Take 1 tablet (125 mcg total) by mouth daily before breakfast. 06/05/14   Renato Shin, MD  oxyCODONE-acetaminophen (ROXICET) 5-325 MG per tablet Take 1 tablet by mouth every 4 (four) hours as needed for severe pain. 06/21/14   Newt Minion, MD  phenazopyridine (PYRIDIUM) 200 MG tablet Take 1 tablet (200 mg total) by mouth 3 (three) times daily. 02/14/15   Konrad Felix, PA  sulfamethoxazole-trimethoprim (BACTRIM DS,SEPTRA DS) 800-160 MG tablet Take 1 tablet by mouth 2 (two) times daily. 02/14/15 02/21/15  Konrad Felix, PA   Meds Ordered and Administered this Visit  Medications - No data to display  BP 125/74 mmHg  Pulse 73  Temp(Src) 98.2 F (36.8 C) (Oral)  SpO2 96% No data found.   Physical Exam  Constitutional: She is oriented to person, place, and time. She appears well-developed and well-nourished.  HENT:  Head: Normocephalic and atraumatic.  Eyes: Conjunctivae are normal.  Cardiovascular: Normal rate, regular rhythm and normal heart sounds.   Pulmonary/Chest: Effort normal and breath sounds normal.  Abdominal: Soft. Bowel sounds are normal. She exhibits no distension. There is no tenderness. There is no rebound and no guarding.  No CVA-T   Musculoskeletal: Normal range of motion.  Neurological: She is alert and oriented to person, place, and time.  Skin: Skin is warm and dry.  Psychiatric: She has a normal mood and affect. Her  behavior is normal. Judgment and thought content normal.    ED Course  Procedures (including critical care time)  Labs Review Labs Reviewed  POCT URINALYSIS DIP (DEVICE) - Abnormal; Notable for the following:    Hgb urine dipstick TRACE (*)    All other components within normal limits    Imaging Review No results found.   Visual Acuity Review  Right Eye Distance:   Left Eye Distance:   Bilateral Distance:    Right Eye Near:   Left Eye Near:    Bilateral Near:         MDM   1. UTI (lower urinary tract infection)    Rx for Bactrim and pyridium given.  Follow up with PCP if there are new or worsening of symptoms    Konrad Felix, PA 02/14/15 [place], [place] 02/14/15 1910

## 2015-02-14 NOTE — Discharge Instructions (Signed)

## 2015-02-14 NOTE — ED Notes (Signed)
The patient presented to the [facility] with a complaint of lower right sided back pain that has been ongoing for 6 days. The patient denied any injury to the back but felt the symptoms were the same as a previous kidney infection.

## 2015-03-06 DIAGNOSIS — E039 Hypothyroidism, unspecified: Secondary | ICD-10-CM | POA: Diagnosis not present

## 2015-04-18 ENCOUNTER — Encounter: Payer: Self-pay | Admitting: Internal Medicine

## 2015-04-18 ENCOUNTER — Ambulatory Visit (INDEPENDENT_AMBULATORY_CARE_PROVIDER_SITE_OTHER): Payer: Medicare HMO | Admitting: Internal Medicine

## 2015-04-18 VITALS — BP 102/68 | HR 75 | Temp 97.5°F | Resp 20 | Ht 66.5 in | Wt 180.4 lb

## 2015-04-18 DIAGNOSIS — H539 Unspecified visual disturbance: Secondary | ICD-10-CM | POA: Diagnosis not present

## 2015-04-18 DIAGNOSIS — Z716 Tobacco abuse counseling: Secondary | ICD-10-CM

## 2015-04-18 DIAGNOSIS — Z79890 Hormone replacement therapy (postmenopausal): Secondary | ICD-10-CM

## 2015-04-18 DIAGNOSIS — M2550 Pain in unspecified joint: Secondary | ICD-10-CM | POA: Diagnosis not present

## 2015-04-18 DIAGNOSIS — E038 Other specified hypothyroidism: Secondary | ICD-10-CM

## 2015-04-18 DIAGNOSIS — E034 Atrophy of thyroid (acquired): Secondary | ICD-10-CM

## 2015-04-18 NOTE — Patient Instructions (Signed)
Will call with referral appt for eyes  Continue current medications as ordered  Follow up in approx 1 month for CPE, fasting

## 2015-04-18 NOTE — Progress Notes (Signed)
Patient ID: Bianca Collins, female   DOB: 07-Apr-1945, 70 y.o.   MRN: [id number]    Location:    [facility]   Place of Service:   OFFICE   Advanced Directive information Does patient have an advance directive?: No, Would patient like information on creating an advanced directive?: Yes - Educational materials given  Chief Complaint  Patient presents with  . Establish Care     New Patient Establish Care  . Medical Management of Chronic Issues    HPI:  70 yo female seen today as a new pt. She reports feeling well overall.  Hypothyroidism - dx in 2001. Followed by Endo Dr Loanne Drilling. Never needed RAI. She has never had a goiter.  She had colonoscopy in 3/16 and adenomatous polyps removed. She needs a repeat study in 5 yrs (2021).  Declines influenza and pneumonia vaccines  Hot flashes due to postmenopausal - stable on estradiol. Followed by GYN Dr Nori Riis. Last mammogram in 2016  Takes Vit D daily  Arthritis - had 3 surgeries on right hip last spring for THR, fx repair.   Tobacco abuse - she has cut back to 1/2 ppd. Plans to quit by Feb 14th  Past Medical History  Diagnosis Date  . Tobacco abuse   . Thyroid disease   . Hypothyroidism   . Arthritis   . Hx of adenomatous polyp of colon 06/13/2014    Past Surgical History  Procedure Laterality Date  . Vaginal hysterectomy  2001    complete  . Rotator cuff repair  09-2013  . Total hip arthroplasty Right 06/19/2014    Procedure: TOTAL HIP ARTHROPLASTY;  Surgeon: Newt Minion, MD;  Location: [facility];  Service: Orthopedics;  Laterality: Right;  . Orif periprosthetic fracture Right 07/02/2014    Procedure: Open Reduction Internal Fixation Femur Fracture, Revision Femur Total Hip Arthroplasty;  Surgeon: Newt Minion, MD;  Location: [facility];  Service: Orthopedics;  Laterality: Right;  . Orif femur fracture Right 07/24/2014    Procedure: OPEN REDUCTION INTERNAL FIXATION (ORIF) DISTAL FEMUR FRACTURE;  Surgeon: Newt Minion, MD;  Location: [facility];   Service: Orthopedics;  Laterality: Right;    Patient Care Team: No Pcp Per Patient as PCP - General (General Practice)  Social History   Social History  . Marital Status: Divorced    Spouse Name: N/A  . Number of Children: N/A  . Years of Education: N/A   Occupational History  . Not on file.   Social History Main Topics  . Smoking status: Current Every Day Smoker -- 0.50 packs/day for 30 years    Types: Cigarettes  . Smokeless tobacco: Never Used     Comment: Patient reports that she is gradually trying to quit  . Alcohol Use: 8.4 oz/week    14 Glasses of wine per week  . Drug Use: No  . Sexual Activity: Not on file   Other Topics Concern  . Not on file   Social History Narrative   Diet?       Do you drink/eat things with caffeine? Tries not to       Marital status?   divorced                                 What year were you married? 2001      Do you live in a house, apartment, assisted living, condo, trailer, etc.? house  Is it one or more stories? no      How many persons live in your home? 4      Do you have any pets in your home? (please list) 3 cats      Current or past profession: Scientist, water quality, worked for [employer], customer service      Do you exercise?        reports that she has been smoking Cigarettes.  She has a 15 pack-year smoking history. She has never used smokeless tobacco. She reports that she drinks about 8.4 oz of alcohol per week. She reports that she does not use illicit drugs.  Family History  Problem Relation Age of Onset  . Colon cancer Neg Hx   . Thyroid disease Neg Hx   . Stomach cancer Neg Hx   . Diabetes type II Mother   . Hypertension Mother   . Diabetes Mother    Family Status  Relation Status Death Age  . Mother Deceased 33  . Father Deceased   . Brother Alive   . Sister Alive   . Brother Alive   . Daughter Alive   . Son Alive     There is no immunization history for the selected administration types on  file for this patient.  No Known Allergies  Medications: Patient's Medications  New Prescriptions   No medications on file  Previous Medications   ASCORBIC ACID (VITAMIN C) 1000 MG TABLET    Take 1,000 mg by mouth daily.   ASPIRIN EC 81 MG TABLET    Take 81 mg by mouth daily.   CHOLECALCIFEROL (VITAMIN D3) 2000 UNITS TABS    Take 1 tablet by mouth daily.   ESTRADIOL (ESTRACE) 1 MG TABLET    Take 1 mg by mouth daily.   LEVOTHYROXINE (SYNTHROID, LEVOTHROID) 125 MCG TABLET    Take 1 tablet (125 mcg total) by mouth daily before breakfast.  Modified Medications   No medications on file  Discontinued Medications   No medications on file    Review of Systems  HENT: Positive for dental problem (dentures).   Eyes: Positive for visual disturbance (sees halos around lights intermittently; vision occasionally decreased;ocaasional floaters ).  Endocrine:       Hot flashes  Skin:       dry    Filed Vitals:   04/18/15 0835  BP: 102/68  Pulse: 75  Temp: 97.5 F (36.4 C)  TempSrc: Oral  Resp: 20  Height: 5' 6.5" (1.689 m)  Weight: 180 lb 6.4 oz (81.829 kg)  SpO2: 97%   Body mass index is 28.68 kg/(m^2).  Physical Exam  Constitutional: She is oriented to person, place, and time. She appears well-developed and well-nourished.  HENT:  Mouth/Throat: Oropharynx is clear and moist. No oropharyngeal exudate.  Eyes: Pupils are equal, round, and reactive to light. No scleral icterus.  Neck: Neck supple. Carotid bruit is not present. No tracheal deviation present. No thyromegaly present.  Cardiovascular: Normal rate, regular rhythm, normal heart sounds and intact distal pulses.  Exam reveals no gallop and no friction rub.   No murmur heard. No LE edema b/l. no calf TTP.   Pulmonary/Chest: Effort normal and breath sounds normal. No stridor. No respiratory distress. She has no wheezes. She has no rales.  Abdominal: Soft. Bowel sounds are normal. She exhibits no distension and no mass. There  is no hepatomegaly. There is no tenderness. There is no rebound and no guarding.  Musculoskeletal: She exhibits edema and tenderness.  Lymphadenopathy:    She has no cervical adenopathy.  Neurological: She is alert and oriented to person, place, and time.  Skin: Skin is warm and dry. No rash noted.  Psychiatric: She has a normal mood and affect. Her behavior is normal. Judgment and thought content normal.     Labs reviewed: Admission on 02/14/2015, Discharged on 02/14/2015  Component Date Value Ref Range Status  . Glucose, UA 02/14/2015 NEGATIVE  NEGATIVE mg/dL Final  . Bilirubin Urine 02/14/2015 NEGATIVE  NEGATIVE Final  . Ketones, ur 02/14/2015 NEGATIVE  NEGATIVE mg/dL Final  . Specific Gravity, Urine 02/14/2015 >=1.030  1.005 - 1.030 Final  . Hgb urine dipstick 02/14/2015 TRACE* NEGATIVE Final  . pH 02/14/2015 6.0  5.0 - 8.0 Final  . Protein, ur 02/14/2015 NEGATIVE  NEGATIVE mg/dL Final  . Urobilinogen, UA 02/14/2015 0.2  0.0 - 1.0 mg/dL Final  . Nitrite 02/14/2015 NEGATIVE  NEGATIVE Final  . Leukocytes, UA 02/14/2015 NEGATIVE  NEGATIVE Final   Biochemical Testing Only. Please order routine urinalysis from main lab if confirmatory testing is needed.    No results found.   Assessment/Plan   ICD-9-CM ICD-10-CM   1. Change in vision 368.9 H53.9 Ambulatory referral to Ophthalmology  2. Hypothyroidism due to acquired atrophy of thyroid 244.8 E03.8    246.8 E03.4   3. Postmenopausal HRT (hormone replacement therapy) V07.4 Z79.890   4. Pain, joint, multiple sites 719.49 M25.50   5. Tobacco abuse counseling V65.42 Z71.6    305.1      Continue current medications as ordered  Follow up in approx 1 month for CPE, fasting  Conard Alvira S. Perlie Gold  [facility] [address] [place], [place] [address] [phone] Cell (Monday-Friday 8 AM - 5 PM) [phone] After 5 PM and follow prompts

## 2015-05-06 DIAGNOSIS — H02834 Dermatochalasis of left upper eyelid: Secondary | ICD-10-CM | POA: Diagnosis not present

## 2015-05-06 DIAGNOSIS — H05243 Constant exophthalmos, bilateral: Secondary | ICD-10-CM | POA: Diagnosis not present

## 2015-05-06 DIAGNOSIS — H25813 Combined forms of age-related cataract, bilateral: Secondary | ICD-10-CM | POA: Diagnosis not present

## 2015-05-06 DIAGNOSIS — H02831 Dermatochalasis of right upper eyelid: Secondary | ICD-10-CM | POA: Diagnosis not present

## 2015-05-23 ENCOUNTER — Other Ambulatory Visit: Payer: Medicare HMO

## 2015-05-28 ENCOUNTER — Ambulatory Visit (INDEPENDENT_AMBULATORY_CARE_PROVIDER_SITE_OTHER): Payer: Medicare HMO | Admitting: Internal Medicine

## 2015-05-28 ENCOUNTER — Encounter: Payer: Self-pay | Admitting: Internal Medicine

## 2015-05-28 VITALS — BP 102/68 | HR 68 | Temp 97.6°F | Resp 18 | Ht 67.0 in | Wt 177.4 lb

## 2015-05-28 DIAGNOSIS — E034 Atrophy of thyroid (acquired): Secondary | ICD-10-CM | POA: Diagnosis not present

## 2015-05-28 DIAGNOSIS — Z0000 Encounter for general adult medical examination without abnormal findings: Secondary | ICD-10-CM

## 2015-05-28 DIAGNOSIS — Z79890 Hormone replacement therapy (postmenopausal): Secondary | ICD-10-CM

## 2015-05-28 DIAGNOSIS — M2550 Pain in unspecified joint: Secondary | ICD-10-CM

## 2015-05-28 DIAGNOSIS — R5383 Other fatigue: Secondary | ICD-10-CM

## 2015-05-28 DIAGNOSIS — E038 Other specified hypothyroidism: Secondary | ICD-10-CM

## 2015-05-28 DIAGNOSIS — Z0189 Encounter for other specified special examinations: Secondary | ICD-10-CM | POA: Diagnosis not present

## 2015-05-28 NOTE — Progress Notes (Signed)
Patient ID: Bianca Collins, female   DOB: 1946-03-15, 70 y.o.   MRN: [id number] Subjective:     Bianca Collins is a 70 y.o. female and is here for a comprehensive physical exam. The patient reports no problems.  She has no c/o today. She will see GYN Dr Nori Riis in 3-4 mos for routine GYN care and plans to have mammogram at that time. She declines pneumovax/prevnar/influenza/zostavax vaccines.   Hypothyroidism - dx in 2001. Followed by Endo Dr Loanne Drilling. Never needed RAI. She has never had a goiter.  She had colonoscopy in 3/16 and adenomatous polyps removed. She needs a repeat study in 5 yrs (2021).  Hot flashes due to postmenopausal - stable on estradiol. Followed by GYN Dr Nori Riis. Last mammogram in 2016  Takes Vit D daily. Not sure if she ever had a DXA scan  Arthritis - had 3 surgeries on right hip last spring for THR, fx repair. She has seen Ortho Dr Sharol Given  Tobacco abuse - quit smoking last Sat in Feb 2017.   Decreased vision - she saw Dr Katy Fitch and cats are not mature yet. She was given pair of reading glasses which help. Next follow up appt in Aug 2017.  Past Medical History  Diagnosis Date  . Tobacco abuse   . Thyroid disease   . Hypothyroidism   . Arthritis   . Hx of adenomatous polyp of colon 06/13/2014   Past Surgical History  Procedure Laterality Date  . Vaginal hysterectomy  2001    complete  . Rotator cuff repair  09-2013  . Total hip arthroplasty Right 06/19/2014    Procedure: TOTAL HIP ARTHROPLASTY;  Surgeon: Newt Minion, MD;  Location: [place];  Service: Orthopedics;  Laterality: Right;  . Orif periprosthetic fracture Right 07/02/2014    Procedure: Open Reduction Internal Fixation Femur Fracture, Revision Femur Total Hip Arthroplasty;  Surgeon: Newt Minion, MD;  Location: [place];  Service: Orthopedics;  Laterality: Right;  . Orif femur fracture Right 07/24/2014    Procedure: OPEN REDUCTION INTERNAL FIXATION (ORIF) DISTAL FEMUR FRACTURE;  Surgeon: Newt Minion, MD;  Location: [place];   Service: Orthopedics;  Laterality: Right;   Family History  Problem Relation Age of Onset  . Colon cancer Neg Hx   . Thyroid disease Neg Hx   . Stomach cancer Neg Hx   . Diabetes type II Mother   . Hypertension Mother   . Diabetes Mother     Social History   Social History  . Marital Status: Divorced    Spouse Name: N/A  . Number of Children: N/A  . Years of Education: N/A   Occupational History  . Not on file.   Social History Main Topics  . Smoking status: Current Every Day Smoker -- 0.50 packs/day for 30 years    Types: Cigarettes  . Smokeless tobacco: Never Used     Comment: Patient reports that she is gradually trying to quit  . Alcohol Use: 8.4 oz/week    14 Glasses of wine per week  . Drug Use: No  . Sexual Activity: Not on file   Other Topics Concern  . Not on file   Social History Narrative   Diet?       Do you drink/eat things with caffeine? Tries not to       Marital status?   divorced  What year were you married? 2001      Do you live in a house, apartment, assisted living, condo, trailer, etc.? house      Is it one or more stories? no      How many persons live in your home? 4      Do you have any pets in your home? (please list) 3 cats      Current or past profession: Scientist, water quality, worked for [employer], customer service      Do you exercise?      Health Maintenance  Topic Date Due  . Hepatitis C Screening  1945/09/06  . TETANUS/TDAP  02/10/1967  . MAMMOGRAM  02/09/1998  . ZOSTAVAX  02/10/2008  . DEXA SCAN  02/09/2013  . PNA vac Low Risk Adult (1 of 2 - PCV13) 02/09/2013  . INFLUENZA VACCINE  10/21/2014  . COLONOSCOPY  06/07/2019    Review of Systems   Review of Systems  Constitutional: Positive for malaise/fatigue.  All other systems reviewed and are negative.    Objective:   Filed Vitals:   05/28/15 0940  BP: 102/68  Pulse: 68  Temp: 97.6 F (36.4 C)  TempSrc: Oral  Resp: 18  Height:  5\' 7"  (1.702 m)  Weight: 177 lb 6.4 oz (80.468 kg)  SpO2: 96%       Physical Exam  Constitutional: She is oriented to person, place, and time and well-developed, well-nourished, and in no distress.  HENT:  Head: Normocephalic and atraumatic.  Right Ear: External ear normal.  Left Ear: External ear normal.  Mouth/Throat: Oropharynx is clear and moist. No oropharyngeal exudate.  Eyes: Conjunctivae and EOM are normal. Pupils are equal, round, and reactive to light. No scleral icterus.  Neck: Normal range of motion. Neck supple. Carotid bruit is not present. No tracheal deviation present. No thyromegaly present.  Cardiovascular: Normal rate, regular rhythm, normal heart sounds and intact distal pulses.  Exam reveals no gallop and no friction rub.   No murmur heard. Pulmonary/Chest: Effort normal and breath sounds normal. She has no wheezes. She has no rhonchi. She has no rales. She exhibits no tenderness. Right breast exhibits no inverted nipple, no mass, no nipple discharge, no skin change and no tenderness. Left breast exhibits no inverted nipple, no mass, no nipple discharge, no skin change and no tenderness. Breasts are symmetrical.  Abdominal: Soft. Bowel sounds are normal. She exhibits no distension and no mass. There is no hepatosplenomegaly. There is no tenderness. There is no rebound and no guarding.  Musculoskeletal: She exhibits edema.  Lymphadenopathy:    She has no cervical adenopathy.  Neurological: She is alert and oriented to person, place, and time. She has normal reflexes. Gait normal.  Skin: Skin is warm and dry. No rash noted.  Multiple skin tags  Psychiatric: Mood, memory, affect and judgment normal.   MMSE - Mini Mental State Exam 05/28/2015 05/28/2015  Not completed: (No Data) (No Data)  Orientation to time 4 4  Orientation to Place 5 5  Registration 3 3  Attention/ Calculation 5 5  Recall 3 3  Language- name 2 objects 2 2  Language- repeat 1 1  Language- follow 3  step command 3 3  Language- read & follow direction 1 1  Write a sentence 1 1  Copy design 1 1  Total score 29 29     No results found for this or any previous visit (from the past 2160 hour(s)).  ECG obtained and reviewed by myself:  SB @ 54 bpm, nml axis, LAE, poor R wave progression. No acute ischemic changes. No changes since 05/2014   Assessment:    Healthy female exam.       ICD-9-CM ICD-10-CM   1. Wellness examination V70.0 Z01.89 EKG 12-Lead     Lipid Panel  2. Hypothyroidism due to acquired atrophy of thyroid 244.8 E03.8 CMP   246.8 E03.4 TSH     T4, Free     CBC with Differential  3. Pain, joint, multiple sites 719.49 M25.50   4. Postmenopausal HRT (hormone replacement therapy) V07.4 Z79.890 CMP  5. Other fatigue 780.79 R53.83 CMP     CBC with Differential    Plan:     See After Visit Summary for Counseling Recommendations   Pt is UTD on health maintenance. Vaccinations are UTD. Pt maintains a healthy lifestyle. Encouraged pt to exercise 30-45 minutes 4-5 times per week. Eat a well balanced diet. Avoid smoking. Limit alcohol intake. Wear seatbelt when riding in the car. Wear sun block (SPF >50) when spending extended times outside.  Will consider cardiac +/- pulm w/u for fatigue if no better with increasing exercise  She has stopped smoking  Continue current medications as ordered  Will call with lab results  Follow up in 6 mos for routine visit  Josel Keo S. Perlie Gold  [facility] [address] [place],  [address] [phone] Cell (Monday-Friday 8 AM - 5 PM) [phone] After 5 PM and follow prompts

## 2015-05-28 NOTE — Patient Instructions (Signed)
Encouraged her to exercise 30-45 minutes 4-5 times per week. Eat a well balanced diet. Avoid smoking. Limit alcohol intake. Wear seatbelt when riding in the car. Wear sun block (SPF >50) when spending extended times outside.  Continue current medications as ordered  Will call with lab results  congrats on smoking cessation  Follow up in 6 mos for routine visit

## 2015-05-29 LAB — COMPREHENSIVE METABOLIC PANEL
ALT: 18 IU/L (ref 0–32)
AST: 16 IU/L (ref 0–40)
Albumin/Globulin Ratio: 1.4 (ref 1.1–2.5)
Albumin: 4 g/dL (ref 3.6–4.8)
Alkaline Phosphatase: 60 IU/L (ref 39–117)
BILIRUBIN TOTAL: 0.4 mg/dL (ref 0.0–1.2)
BUN/Creatinine Ratio: 22 (ref 11–26)
BUN: 14 mg/dL (ref 8–27)
CALCIUM: 9.4 mg/dL (ref 8.7–10.3)
CHLORIDE: 100 mmol/L (ref 96–106)
CO2: 24 mmol/L (ref 18–29)
Creatinine, Ser: 0.63 mg/dL (ref 0.57–1.00)
GFR calc non Af Amer: 93 mL/min/{1.73_m2} (ref >59)
GFR, EST AFRICAN AMERICAN: 107 mL/min/{1.73_m2} (ref >59)
GLUCOSE: 85 mg/dL (ref 65–99)
Globulin, Total: 2.8 g/dL (ref 1.5–4.5)
Potassium: 4.1 mmol/L (ref 3.5–5.2)
Sodium: 140 mmol/L (ref 134–144)
TOTAL PROTEIN: 6.8 g/dL (ref 6.0–8.5)

## 2015-05-29 LAB — LIPID PANEL
Chol/HDL Ratio: 3.3 ratio units (ref 0.0–4.4)
Cholesterol, Total: 169 mg/dL (ref 100–199)
HDL: 52 mg/dL (ref >39)
LDL Calculated: 100 mg/dL — ABNORMAL HIGH (ref 0–99)
TRIGLYCERIDES: 85 mg/dL (ref 0–149)
VLDL CHOLESTEROL CAL: 17 mg/dL (ref 5–40)

## 2015-05-29 LAB — CBC WITH DIFFERENTIAL/PLATELET
BASOS ABS: 0 10*3/uL (ref 0.0–0.2)
BASOS: 0 %
EOS (ABSOLUTE): 0.1 10*3/uL (ref 0.0–0.4)
EOS: 1 %
Hematocrit: 39.8 % (ref 34.0–46.6)
Hemoglobin: 13.3 g/dL (ref 11.1–15.9)
Immature Grans (Abs): 0 10*3/uL (ref 0.0–0.1)
Immature Granulocytes: 0 %
LYMPHS: 29 %
Lymphocytes Absolute: 2.6 10*3/uL (ref 0.7–3.1)
MCH: 32.4 pg (ref 26.6–33.0)
MCHC: 33.4 g/dL (ref 31.5–35.7)
MCV: 97 fL (ref 79–97)
MONOCYTES: 8 %
Monocytes Absolute: 0.8 10*3/uL (ref 0.1–0.9)
NEUTROS ABS: 5.4 10*3/uL (ref 1.4–7.0)
Neutrophils: 62 %
PLATELETS: 329 10*3/uL (ref 150–379)
RBC: 4.1 x10E6/uL (ref 3.77–5.28)
RDW: 12.8 % (ref 12.3–15.4)
WBC: 9 10*3/uL (ref 3.4–10.8)

## 2015-05-29 LAB — TSH: TSH: 0.092 u[IU]/mL — AB (ref 0.450–4.500)

## 2015-05-29 LAB — T4, FREE: Free T4: 2.08 ng/dL — ABNORMAL HIGH (ref 0.82–1.77)

## 2015-05-30 ENCOUNTER — Telehealth: Payer: Self-pay

## 2015-05-30 DIAGNOSIS — E039 Hypothyroidism, unspecified: Secondary | ICD-10-CM

## 2015-05-30 MED ORDER — LEVOTHYROXINE SODIUM 100 MCG PO TABS: 100.0000 ug | ORAL_TABLET | Freq: Every day | ORAL | Status: DC

## 2015-05-30 NOTE — Telephone Encounter (Signed)
-----   Message from [place], [place] sent at 05/29/2015  2:26 PM EST ----- Thyroid is overactive - reduce levothyroxine to 129mcg #30 take 1 po daily with 2 RF; repeat TSH and free T4 in 6 weeks; other labs stable; f/u as scheduled

## 2015-05-30 NOTE — Telephone Encounter (Signed)
Discussed with patient, copy of labs mailed. New rx sent to pharmacy and appointment scheduled to recheck labs in 6 weeks.

## 2015-06-15 IMAGING — RF DG C-ARM 61-120 MIN
1 series · 2 of 2 positions shown · non-contrast
Comparison: None.

CLINICAL DATA: ORIF right femur fracture

EXAM:
DG C-ARM 61-120 MIN; RIGHT FEMUR 2 VIEWS

[Series 1: run · 2 of 2 slices shown]
[im 1/2]
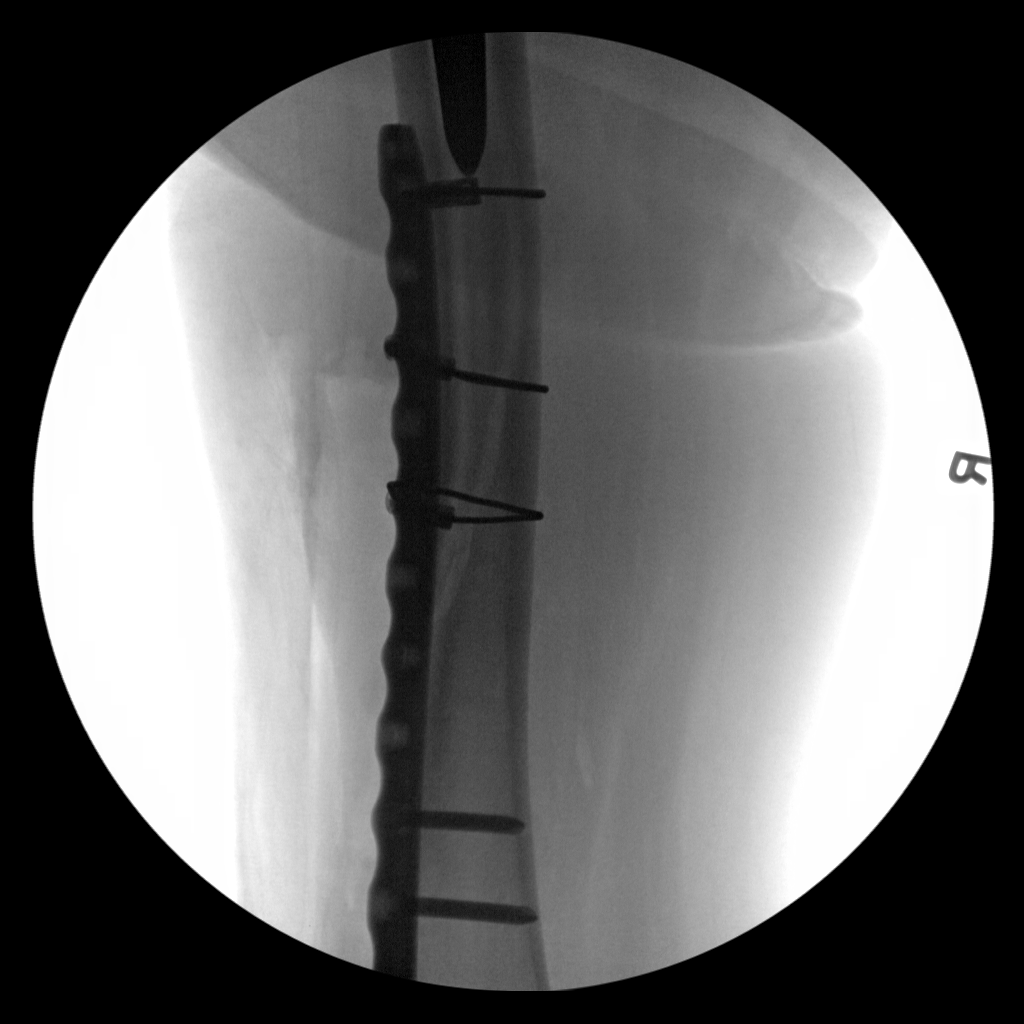
[im 2/2]
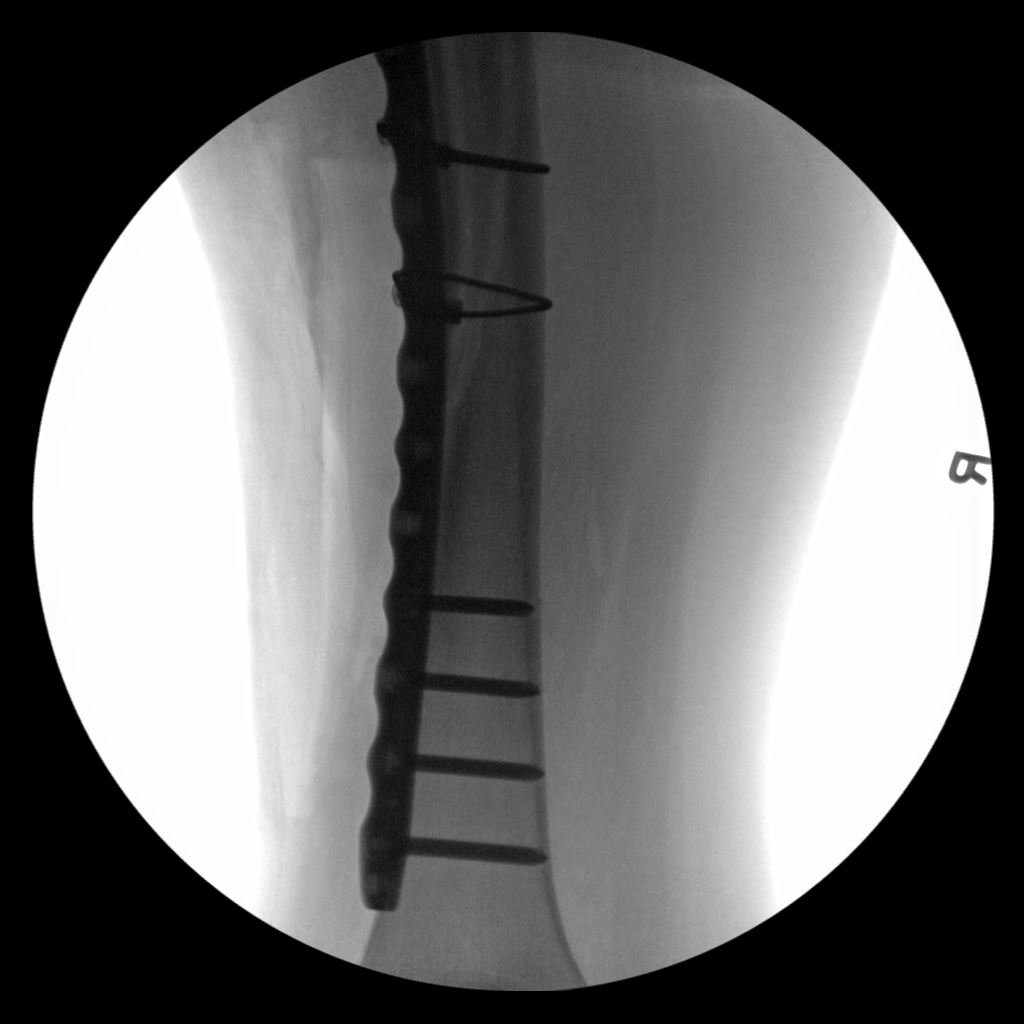

[2 of 2 positions shown; findings below may reference images not displayed]

FLUOROSCOPY TIME:  Radiation Exposure Index (as provided by the
fluoroscopic device):

If the device does not provide the exposure index:

Fluoroscopy Time:  12 seconds

Number of Acquired Images:  0
FINDINGS: Two saved intraoperative spot images demonstrate plate and screw
fixation as well as cerclage wires across the oblique right femoral
shaft fracture. Anatomic alignment on these AP views. No
complicating feature.
IMPRESSION: Internal fixation across the right femoral shaft fracture with
normal AP alignment.

## 2015-07-02 ENCOUNTER — Telehealth: Payer: Self-pay

## 2015-07-02 ENCOUNTER — Other Ambulatory Visit: Payer: Medicare HMO

## 2015-07-02 DIAGNOSIS — E039 Hypothyroidism, unspecified: Secondary | ICD-10-CM | POA: Diagnosis not present

## 2015-07-02 NOTE — Telephone Encounter (Signed)
Patient came in today for lab work. She took her last dose of thyroid yesterday. She wants to know if she should refill her current dose of Synthroid, or could she just take her previous higher dose, until she gets her lab test results? Will ask Dr. Eulas Post.

## 2015-07-02 NOTE — Telephone Encounter (Signed)
Refill her current thyroid dose until her labs come back

## 2015-07-02 NOTE — Telephone Encounter (Signed)
Called patient, told her to refill current dose of Synthroid

## 2015-07-03 LAB — TSH: TSH: 0.299 u[IU]/mL — ABNORMAL LOW (ref 0.450–4.500)

## 2015-07-03 LAB — T4, FREE: Free T4: 1.21 ng/dL (ref 0.82–1.77)

## 2015-09-12 ENCOUNTER — Other Ambulatory Visit: Payer: Self-pay

## 2015-09-12 ENCOUNTER — Other Ambulatory Visit: Payer: Self-pay | Admitting: Internal Medicine

## 2015-09-12 MED ORDER — LEVOTHYROXINE SODIUM 100 MCG PO TABS: ORAL_TABLET | ORAL | Status: DC

## 2015-11-04 DIAGNOSIS — H02834 Dermatochalasis of left upper eyelid: Secondary | ICD-10-CM | POA: Diagnosis not present

## 2015-11-04 DIAGNOSIS — H25813 Combined forms of age-related cataract, bilateral: Secondary | ICD-10-CM | POA: Diagnosis not present

## 2015-11-04 DIAGNOSIS — H02831 Dermatochalasis of right upper eyelid: Secondary | ICD-10-CM | POA: Diagnosis not present

## 2015-11-04 DIAGNOSIS — H04123 Dry eye syndrome of bilateral lacrimal glands: Secondary | ICD-10-CM | POA: Diagnosis not present

## 2015-11-28 ENCOUNTER — Encounter: Payer: Self-pay | Admitting: Internal Medicine

## 2015-11-28 ENCOUNTER — Ambulatory Visit (INDEPENDENT_AMBULATORY_CARE_PROVIDER_SITE_OTHER): Payer: Medicare HMO | Admitting: Internal Medicine

## 2015-11-28 VITALS — BP 124/78 | HR 76 | Temp 97.6°F | Ht 67.0 in | Wt 183.2 lb

## 2015-11-28 DIAGNOSIS — E034 Atrophy of thyroid (acquired): Secondary | ICD-10-CM

## 2015-11-28 DIAGNOSIS — E038 Other specified hypothyroidism: Secondary | ICD-10-CM

## 2015-11-28 DIAGNOSIS — M151 Heberden's nodes (with arthropathy): Secondary | ICD-10-CM

## 2015-11-28 DIAGNOSIS — M2550 Pain in unspecified joint: Secondary | ICD-10-CM | POA: Diagnosis not present

## 2015-11-28 DIAGNOSIS — Z79890 Hormone replacement therapy (postmenopausal): Secondary | ICD-10-CM

## 2015-11-28 LAB — TSH: TSH: 0.39 mIU/L — ABNORMAL LOW

## 2015-11-28 LAB — T4, FREE: Free T4: 1.2 ng/dL (ref 0.8–1.8)

## 2015-11-28 NOTE — Patient Instructions (Addendum)
Reassurance given for joint nodule -- Heberden's Node  Continue current medications as ordered  Recommend follow up with GYN Dr Nori Riis AND MAMMOGRAM  Follow up in 6 mos for CPE/ECG

## 2015-11-28 NOTE — Progress Notes (Signed)
Patient ID: Bianca Collins, female   DOB: 01-Mar-1946, 70 y.o.   MRN: [id number]    Location:  [facility] Place of Service: OFFICE  Chief Complaint  Patient presents with  . Medical Management of Chronic Issues    6 months routine visit    HPI:  70 yo female seen today for f/u. She is c/a "cysts" on her finger joints x several months. No pain. Her mother had similar areas on fingers. She had resumed smoking but stopped Aug 28th. Her daughter and son-in-law have also quit smoking and they are serving to encourage one another to continue success.   She declines pneumovax/prevnar/influenza/zostavax vaccines.   Hypothyroidism - dx in 2001. No longer sees Endo Dr Loanne Drilling. Takes levothyroxine. Never needed RAI. She has never had a goiter.  She had colonoscopy in 3/16 and adenomatous polyps removed. She needs a repeat study in 5 yrs (2021).  Hot flashes due to postmenopausal - stable on estradiol. Followed by GYN Dr Nori Riis. Last mammogram in 2016  Takes Vit D daily. Not sure if she ever had a DXA scan  Arthritis - had 3 surgeries on right hip last spring for THR, fx repair. She has seen Ortho Dr Sharol Given  Tobacco abuse - quit smoking August 28th.   Decreased vision - she saw Dr Katy Fitch and cats are not mature yet. She was given pair of reading glasses which help. Next follow up appt in 1 yr unless she has change in her vision   Past Medical History:  Diagnosis Date  . Arthritis   . Hx of adenomatous polyp of colon 06/13/2014  . Hypothyroidism   . Thyroid disease   . Tobacco abuse     Past Surgical History:  Procedure Laterality Date  . ORIF FEMUR FRACTURE Right 07/24/2014   Procedure: OPEN REDUCTION INTERNAL FIXATION (ORIF) DISTAL FEMUR FRACTURE;  Surgeon: Newt Minion, MD;  Location: [facility];  Service: Orthopedics;  Laterality: Right;  . ORIF PERIPROSTHETIC FRACTURE Right 07/02/2014   Procedure: Open Reduction Internal Fixation Femur Fracture, Revision Femur Total Hip Arthroplasty;  Surgeon: Newt Minion, MD;  Location: [facility];  Service: Orthopedics;  Laterality: Right;  . ROTATOR CUFF REPAIR  09-2013  . TOTAL HIP ARTHROPLASTY Right 06/19/2014   Procedure: TOTAL HIP ARTHROPLASTY;  Surgeon: Newt Minion, MD;  Location: [facility];  Service: Orthopedics;  Laterality: Right;  Marland Kitchen VAGINAL HYSTERECTOMY  2001   complete    Patient Care Team: Gildardo Cranker, DO as PCP - General (Internal Medicine)  Social History   Social History  . Marital status: Divorced    Spouse name: N/A  . Number of children: N/A  . Years of education: N/A   Occupational History  . Not on file.   Social History Main Topics  . Smoking status: Current Every Day Smoker    Packs/day: 0.50    Years: 30.00    Types: Cigarettes  . Smokeless tobacco: Never Used     Comment: Patient reports that she is gradually trying to quit  . Alcohol use 8.4 oz/week    14 Glasses of wine per week  . Drug use: No  . Sexual activity: Not on file   Other Topics Concern  . Not on file   Social History Narrative   Diet?       Do you drink/eat things with caffeine? Tries not to       Marital status?   divorced  What year were you married? 2001      Do you live in a house, apartment, assisted living, condo, trailer, etc.? house      Is it one or more stories? no      How many persons live in your home? 4      Do you have any pets in your home? (please list) 3 cats      Current or past profession: Scientist, water quality, worked for [employer], customer service      Do you exercise?        reports that she has been smoking Cigarettes.  She has a 15.00 pack-year smoking history. She has never used smokeless tobacco. She reports that she drinks about 8.4 oz of alcohol per week . She reports that she does not use drugs.  Family History  Problem Relation Age of Onset  . Diabetes type II Mother   . Hypertension Mother   . Diabetes Mother   . Colon cancer Neg Hx   . Thyroid disease Neg Hx   . Stomach  cancer Neg Hx    Family Status  Relation Status  . Mother Deceased at age 20  . Father Deceased  . Brother Alive  . Sister Alive  . Brother Alive  . Daughter Alive  . Son Alive  . Neg Hx      No Known Allergies  Medications: Patient's Medications  New Prescriptions   No medications on file  Previous Medications   ASCORBIC ACID (VITAMIN C) 1000 MG TABLET    Take 1,000 mg by mouth daily.   ASPIRIN EC 81 MG TABLET    Take 81 mg by mouth daily.   CHOLECALCIFEROL (VITAMIN D3) 2000 UNITS TABS    Take 1 tablet by mouth daily.   ESTRADIOL (ESTRACE) 1 MG TABLET    Take 1 mg by mouth daily.   LEVOTHYROXINE (SYNTHROID, LEVOTHROID) 100 MCG TABLET    TAKE ONE TABLET BY MOUTH DAILY BEFORE BREAKFAST  Modified Medications   No medications on file  Discontinued Medications   No medications on file    Review of Systems  Musculoskeletal: Positive for arthralgias and joint swelling.  All other systems reviewed and are negative.   Vitals:   11/28/15 0915  BP: 124/78  Pulse: 76  Temp: 97.6 F (36.4 C)  TempSrc: Oral  SpO2: 97%  Weight: 183 lb 3.2 oz (83.1 kg)  Height: 5\' 7"  (1.702 m)   Body mass index is 28.69 kg/m.  Physical Exam  Constitutional: She is oriented to person, place, and time. She appears well-developed and well-nourished.  HENT:  Mouth/Throat: Oropharynx is clear and moist. No oropharyngeal exudate.  Eyes: Pupils are equal, round, and reactive to light. No scleral icterus.  Neck: Neck supple. Carotid bruit is not present. No tracheal deviation present. No thyromegaly present.  Cardiovascular: Normal rate, regular rhythm, normal heart sounds and intact distal pulses.  Exam reveals no gallop and no friction rub.   No murmur heard. No LE edema b/l. no calf TTP.   Pulmonary/Chest: Effort normal and breath sounds normal. No stridor. No respiratory distress. She has no wheezes. She has no rales.  Abdominal: Soft. Bowel sounds are normal. She exhibits no distension and  no mass. There is no hepatomegaly. There is no tenderness. There is no rebound and no guarding.  Musculoskeletal: She exhibits edema. She exhibits no deformity.  Soft small Heberden's nodules on left thumb PIP joint; hard nodules on right 2nd finger DIP joint; NT and  no redness  Lymphadenopathy:    She has no cervical adenopathy.  Neurological: She is alert and oriented to person, place, and time.  Skin: Skin is warm and dry. No rash noted.  Psychiatric: She has a normal mood and affect. Her behavior is normal. Judgment and thought content normal.     Labs reviewed: No visits with results within 3 Month(s) from this visit.  Latest known visit with results is:  Appointment on 07/02/2015  Component Date Value Ref Range Status  . Free T4 07/03/2015 1.21  0.82 - 1.77 ng/dL Final  . TSH 07/03/2015 0.299* 0.450 - 4.500 uIU/mL Final    No results found.   Assessment/Plan   ICD-9-CM ICD-10-CM   1. Heberden's nodes 715.04 M15.1   2. Hypothyroidism due to acquired atrophy of thyroid 244.8 E03.8 TSH   246.8 E03.4 T4, Free  3. Pain, joint, multiple sites 719.49 M25.50   4. Postmenopausal HRT (hormone replacement therapy) V07.4 Z79.890    Reassurance given for joint nodule -- Heberden's Node. Education handout provided  Continue current medications as ordered  Recommend follow up with GYN Dr Nori Riis AND MAMMOGRAM  Follow up in 6 mos for CPE/ECG. Will obtain fasting labs at visit (cbc w diff, cmp, lipid panel, tsh, free t4)  Earlee Herald S. Perlie Gold  [facility] [address] [place], [place] [address] [phone] Cell (Monday-Friday 8 AM - 5 PM) [phone] After 5 PM and follow prompts

## 2015-12-23 DIAGNOSIS — E039 Hypothyroidism, unspecified: Secondary | ICD-10-CM | POA: Diagnosis not present

## 2015-12-23 DIAGNOSIS — Z0000 Encounter for general adult medical examination without abnormal findings: Secondary | ICD-10-CM | POA: Diagnosis not present

## 2016-05-28 ENCOUNTER — Encounter: Payer: Medicare HMO | Admitting: Internal Medicine

## 2016-06-09 ENCOUNTER — Other Ambulatory Visit: Payer: Self-pay | Admitting: Internal Medicine

## 2016-08-30 DIAGNOSIS — N958 Other specified menopausal and perimenopausal disorders: Secondary | ICD-10-CM | POA: Diagnosis not present

## 2016-08-30 DIAGNOSIS — Z1231 Encounter for screening mammogram for malignant neoplasm of breast: Secondary | ICD-10-CM | POA: Diagnosis not present

## 2016-08-30 DIAGNOSIS — Z90712 Acquired absence of cervix with remaining uterus: Secondary | ICD-10-CM | POA: Diagnosis not present

## 2016-08-30 DIAGNOSIS — Z6830 Body mass index (BMI) 30.0-30.9, adult: Secondary | ICD-10-CM | POA: Diagnosis not present

## 2016-08-30 DIAGNOSIS — Z124 Encounter for screening for malignant neoplasm of cervix: Secondary | ICD-10-CM | POA: Diagnosis not present

## 2016-08-30 LAB — HM DEXA SCAN: HM Dexa Scan: NORMAL

## 2016-08-30 LAB — HM MAMMOGRAPHY: HM MAMMO: NORMAL (ref 0–4)

## 2016-09-03 ENCOUNTER — Ambulatory Visit (INDEPENDENT_AMBULATORY_CARE_PROVIDER_SITE_OTHER): Payer: Medicare HMO

## 2016-09-03 ENCOUNTER — Encounter (INDEPENDENT_AMBULATORY_CARE_PROVIDER_SITE_OTHER): Payer: Self-pay | Admitting: Family

## 2016-09-03 ENCOUNTER — Ambulatory Visit (INDEPENDENT_AMBULATORY_CARE_PROVIDER_SITE_OTHER): Payer: Medicare HMO | Admitting: Family

## 2016-09-03 VITALS — Ht 67.0 in | Wt 183.0 lb

## 2016-09-03 DIAGNOSIS — G8929 Other chronic pain: Secondary | ICD-10-CM

## 2016-09-03 DIAGNOSIS — M1712 Unilateral primary osteoarthritis, left knee: Secondary | ICD-10-CM | POA: Diagnosis not present

## 2016-09-03 DIAGNOSIS — M25562 Pain in left knee: Secondary | ICD-10-CM

## 2016-09-03 NOTE — Progress Notes (Signed)
Office Visit Note   Patient: Bianca Collins           Date of Birth: 1945/07/25           MRN: [id number] Visit Date: 09/03/2016              Requested by: Gildardo Cranker, [place], [place] [address] PCP: Gildardo Cranker, DO  Chief Complaint  Patient presents with  . Left Knee - Pain      HPI: The patient is a 71 year old woman who presents today complaining of left knee pain for the last 6 weeks this has been intermittent. Has been worse for the last 3 weeks. She states about 3 weeks for short bowling and has had worse pain since. No known injury or twisting accident. Complaining of anterior medial pain this is associated with swelling occasional posterior pain. Pain with ambulation is worse. Complaining of stiffness. No mechanical symptoms. Does have pain with extension.  Assessment & Plan: Visit Diagnoses:  1. Chronic pain of left knee   2. Primary osteoarthritis of left knee     Plan: Depo-Medrol injection left knee today. She will follow-up in office in 4 weeks.  Follow-Up Instructions: Return in about 4 weeks (around 10/01/2016), or if symptoms worsen or fail to improve.   Right Knee Exam   Tenderness  The patient is experiencing tenderness in the medial joint line.  Range of Motion  The patient has normal right knee ROM.  Tests  Varus: negative Valgus: negative  Other  Erythema: absent Swelling: mild Other tests: no effusion present      Patient is alert, oriented, no adenopathy, well-dressed, normal affect, normal respiratory effort.    Imaging: Xr Knee 1-2 Views Left  Result Date: 09/03/2016 Radiographs of the left knee shows medial joint space narrowing. Near bone-on-bone contact. There is no osteophytic spurring.   Labs: No results found for: HGBA1C, ESRSEDRATE, CRP, LABURIC, REPTSTATUS, GRAMSTAIN, CULT, LABORGA  Orders:  Orders Placed This Encounter  Procedures  . XR Knee 1-2 Views Left   No orders of the defined types were  placed in this encounter.    Procedures: No procedures performed  Clinical Data: No additional findings.  ROS:  All other systems negative, except as noted in the HPI. Review of Systems  Constitutional: Negative for chills and fever.  Musculoskeletal: Positive for arthralgias and joint swelling.    Objective: Vital Signs: Ht 5\' 7"  (1.702 m)   Wt 183 lb (83 kg)   BMI 28.66 kg/m   Specialty Comments:  No specialty comments available.  PMFS History: Patient Active Problem List   Diagnosis Date Noted  . Femur fracture, right ([place] [facility]) 07/25/2014  . Closed femur fracture ([facility] [place]) 07/24/2014  . Periprosthetic fracture around internal prosthetic right hip joint ([place]) 07/05/2014  . Periprosth fracture around internal prosth r hip jt, sequela 07/02/2014  . H/O total hip arthroplasty 06/19/2014  . Hx of adenomatous polyp of colon 06/13/2014  . Hypothyroidism 06/06/2014   Past Medical History:  Diagnosis Date  . Arthritis   . Hx of adenomatous polyp of colon 06/13/2014  . Hypothyroidism   . Thyroid disease   . Tobacco abuse     Family History  Problem Relation Age of Onset  . Diabetes type II Mother   . Hypertension Mother   . Diabetes Mother   . Colon cancer Neg Hx   . Thyroid disease Neg Hx   . Stomach cancer Neg Hx     Past  Surgical History:  Procedure Laterality Date  . ORIF FEMUR FRACTURE Right 07/24/2014   Procedure: OPEN REDUCTION INTERNAL FIXATION (ORIF) DISTAL FEMUR FRACTURE;  Surgeon: Newt Minion, MD;  Location: [facility];  Service: Orthopedics;  Laterality: Right;  . ORIF PERIPROSTHETIC FRACTURE Right 07/02/2014   Procedure: Open Reduction Internal Fixation Femur Fracture, Revision Femur Total Hip Arthroplasty;  Surgeon: Newt Minion, MD;  Location: [facility];  Service: Orthopedics;  Laterality: Right;  . ROTATOR CUFF REPAIR  09-2013  . TOTAL HIP ARTHROPLASTY Right 06/19/2014   Procedure: TOTAL HIP ARTHROPLASTY;  Surgeon: Newt Minion, MD;  Location: [facility];  Service:  Orthopedics;  Laterality: Right;  Marland Kitchen VAGINAL HYSTERECTOMY  2001   complete   Social History   Occupational History  . Not on file.   Social History Main Topics  . Smoking status: Current Every Day Smoker    Packs/day: 0.50    Years: 30.00    Types: Cigarettes  . Smokeless tobacco: Never Used     Comment: Patient reports that she is gradually trying to quit  . Alcohol use 8.4 oz/week    14 Glasses of wine per week  . Drug use: No  . Sexual activity: Not on file

## 2016-09-17 ENCOUNTER — Other Ambulatory Visit: Payer: Self-pay

## 2016-09-17 MED ORDER — LEVOTHYROXINE SODIUM 100 MCG PO TABS: ORAL_TABLET | ORAL | 1 refills | Status: DC

## 2016-10-01 ENCOUNTER — Ambulatory Visit (INDEPENDENT_AMBULATORY_CARE_PROVIDER_SITE_OTHER): Payer: Medicare HMO | Admitting: Family

## 2016-10-01 ENCOUNTER — Encounter (INDEPENDENT_AMBULATORY_CARE_PROVIDER_SITE_OTHER): Payer: Self-pay | Admitting: Family

## 2016-10-01 VITALS — Ht 67.0 in | Wt 183.0 lb

## 2016-10-01 DIAGNOSIS — M1712 Unilateral primary osteoarthritis, left knee: Secondary | ICD-10-CM | POA: Diagnosis not present

## 2016-10-01 NOTE — Progress Notes (Signed)
Office Visit Note   Patient: Bianca Collins           Date of Birth: 12-19-1945           MRN: [id number] Visit Date: 10/01/2016              Requested by: Gildardo Cranker, [place], [place] [address] PCP: Gildardo Cranker, DO  Chief Complaint  Patient presents with  . Left Knee - Follow-up    S/p injection 09/03/16      HPI: The patient is a 71 year old woman who presents today complaining of Chronic left knee pain No known injury or twisting accident. Complaining of anterior medial pain this is associated with swelling occasional posterior pain. Pain with ambulation is worse. Complaining of stiffness. No mechanical symptoms. Does have pain with extension.  Is about a month out from a Depo-Medrol injection which she states was helpful for about 5 days.  Assessment & Plan: Visit Diagnoses:  1. Primary osteoarthritis of left knee     Plan: Due to lack of relief with Depo-Medrol injection will proceed with Synvisc one injection.   Follow-Up Instructions: Return for Synvisc injection.   Right Knee Exam   Tenderness  The patient is experiencing tenderness in the medial joint line.  Range of Motion  The patient has normal right knee ROM.  Tests  Varus: negative Valgus: negative  Other  Erythema: absent Swelling: mild Other tests: no effusion present      Patient is alert, oriented, no adenopathy, well-dressed, normal affect, normal respiratory effort.    Imaging: No results found.  Labs: No results found for: HGBA1C, ESRSEDRATE, CRP, LABURIC, REPTSTATUS, GRAMSTAIN, CULT, LABORGA  Orders:  No orders of the defined types were placed in this encounter.  No orders of the defined types were placed in this encounter.    Procedures: No procedures performed  Clinical Data: No additional findings.  ROS:  All other systems negative, except as noted in the HPI. Review of Systems  Constitutional: Negative for chills and fever.    Musculoskeletal: Positive for arthralgias and joint swelling.    Objective: Vital Signs: Ht 5\' 7"  (1.702 m)   Wt 183 lb (83 kg)   BMI 28.66 kg/m   Specialty Comments:  No specialty comments available.  PMFS History: Patient Active Problem List   Diagnosis Date Noted  . Femur fracture, right ([place]) 07/25/2014  . Closed femur fracture ([place]) 07/24/2014  . Periprosthetic fracture around internal prosthetic right hip joint ([place]) 07/05/2014  . Periprosth fracture around internal prosth r hip jt, sequela 07/02/2014  . H/O total hip arthroplasty 06/19/2014  . Hx of adenomatous polyp of colon 06/13/2014  . Hypothyroidism 06/06/2014   Past Medical History:  Diagnosis Date  . Arthritis   . Hx of adenomatous polyp of colon 06/13/2014  . Hypothyroidism   . Thyroid disease   . Tobacco abuse     Family History  Problem Relation Age of Onset  . Diabetes type II Mother   . Hypertension Mother   . Diabetes Mother   . Colon cancer Neg Hx   . Thyroid disease Neg Hx   . Stomach cancer Neg Hx     Past Surgical History:  Procedure Laterality Date  . ORIF FEMUR FRACTURE Right 07/24/2014   Procedure: OPEN REDUCTION INTERNAL FIXATION (ORIF) DISTAL FEMUR FRACTURE;  Surgeon: Newt Minion, MD;  Location: [facility];  Service: Orthopedics;  Laterality: Right;  . ORIF PERIPROSTHETIC FRACTURE Right 07/02/2014  Procedure: Open Reduction Internal Fixation Femur Fracture, Revision Femur Total Hip Arthroplasty;  Surgeon: Newt Minion, MD;  Location: [facility];  Service: Orthopedics;  Laterality: Right;  . ROTATOR CUFF REPAIR  09-2013  . TOTAL HIP ARTHROPLASTY Right 06/19/2014   Procedure: TOTAL HIP ARTHROPLASTY;  Surgeon: Newt Minion, MD;  Location: [facility];  Service: Orthopedics;  Laterality: Right;  Marland Kitchen VAGINAL HYSTERECTOMY  2001   complete   Social History   Occupational History  . Not on file.   Social History Main Topics  . Smoking status: Current Every Day Smoker    Packs/day: 0.50    Years: 30.00     Types: Cigarettes  . Smokeless tobacco: Never Used     Comment: Patient reports that she is gradually trying to quit  . Alcohol use 8.4 oz/week    14 Glasses of wine per week  . Drug use: No  . Sexual activity: Not on file

## 2016-10-05 ENCOUNTER — Telehealth (INDEPENDENT_AMBULATORY_CARE_PROVIDER_SITE_OTHER): Payer: Self-pay | Admitting: Radiology

## 2016-10-05 NOTE — Telephone Encounter (Signed)
Submitted synvisc one on provider portal and prior authorization submitted to [facility] [id number]. Pending approval from insurance.

## 2016-10-05 NOTE — Telephone Encounter (Signed)
-----   Message from Suzan Slick, NP sent at 10/01/2016  1:41 PM EDT ----- Order Synvisc one

## 2016-10-11 DIAGNOSIS — R600 Localized edema: Secondary | ICD-10-CM | POA: Diagnosis not present

## 2016-10-11 DIAGNOSIS — K08409 Partial loss of teeth, unspecified cause, unspecified class: Secondary | ICD-10-CM | POA: Diagnosis not present

## 2016-10-11 DIAGNOSIS — Z6830 Body mass index (BMI) 30.0-30.9, adult: Secondary | ICD-10-CM | POA: Diagnosis not present

## 2016-10-11 DIAGNOSIS — E669 Obesity, unspecified: Secondary | ICD-10-CM | POA: Diagnosis not present

## 2016-10-11 DIAGNOSIS — E039 Hypothyroidism, unspecified: Secondary | ICD-10-CM | POA: Diagnosis not present

## 2016-10-11 DIAGNOSIS — N959 Unspecified menopausal and perimenopausal disorder: Secondary | ICD-10-CM | POA: Diagnosis not present

## 2016-10-11 DIAGNOSIS — M659 Synovitis and tenosynovitis, unspecified: Secondary | ICD-10-CM | POA: Diagnosis not present

## 2016-10-11 DIAGNOSIS — Z0000 Encounter for general adult medical examination without abnormal findings: Secondary | ICD-10-CM | POA: Diagnosis not present

## 2016-10-12 ENCOUNTER — Telehealth (INDEPENDENT_AMBULATORY_CARE_PROVIDER_SITE_OTHER): Payer: Self-pay

## 2016-10-12 NOTE — Telephone Encounter (Signed)
-----   Message from Pamella Pert, [place] sent at 10/11/2016  4:57 PM EDT ----- Can you please cal pt and make appt for gel injection for whenever she would like to come in? Thanks! ----- Message ----- From: Suzan Slick, NP Sent: 10/01/2016   1:41 PM To: Pamella Pert, RMA  Order Synvisc one

## 2016-10-12 NOTE — Telephone Encounter (Signed)
Talked with patient and appointment is scheduled for Wednesday, 10/13/16.

## 2016-10-13 ENCOUNTER — Ambulatory Visit (INDEPENDENT_AMBULATORY_CARE_PROVIDER_SITE_OTHER): Payer: Medicare HMO | Admitting: Family

## 2016-10-13 DIAGNOSIS — M1712 Unilateral primary osteoarthritis, left knee: Secondary | ICD-10-CM | POA: Diagnosis not present

## 2016-10-13 DIAGNOSIS — M25562 Pain in left knee: Secondary | ICD-10-CM

## 2016-10-13 MED ORDER — HYLAN G-F 20 48 MG/6ML IX SOSY
48.0000 mg | PREFILLED_SYRINGE | INTRA_ARTICULAR | Status: AC | PRN
  Administered 2016-10-13: 48 mg via INTRA_ARTICULAR

## 2016-10-13 MED ORDER — LIDOCAINE HCL 1 % IJ SOLN
1.0000 mL | INTRAMUSCULAR | Status: AC | PRN
  Administered 2016-10-13: 1 mL

## 2016-10-13 NOTE — Progress Notes (Signed)
Office Visit Note   Patient: Bianca Collins           Date of Birth: [birth year]           MRN: [id number] Visit Date: 10/13/2016              Requested by: Gildardo Cranker, [place], [place] [address] PCP: Gildardo Cranker, DO  No chief complaint on file.     HPI: The patient is a 71 year old woman who presents today complaining of chronic left knee pain No known injury or twisting accident. Complaining of anterior medial pain this is associated with swelling occasional posterior pain. Pain with ambulation is worse. Complaining of stiffness. No mechanical symptoms. Does have pain with extension.  Resents today for Synvisc one injection.  Assessment & Plan: Visit Diagnoses: No diagnosis found.  Plan: Synvisc one injection left knee. We'll follow-up in office as needed.  Follow-Up Instructions: No Follow-up on file.   Ortho Exam  Patient is alert, oriented, no adenopathy, well-dressed, normal affect, normal respiratory effort. Right Knee Exam   Tenderness  The patient is experiencing tenderness in the medial joint line.  Range of Motion  The patient has normal right knee ROM.  Tests  Varus: negative Valgus: negative  Other  Erythema: absent Swelling: mild Other tests: no effusion present  Imaging: No results found.  Labs: No results found for: HGBA1C, ESRSEDRATE, CRP, LABURIC, REPTSTATUS, GRAMSTAIN, CULT, LABORGA  Orders:  No orders of the defined types were placed in this encounter.  No orders of the defined types were placed in this encounter.    Procedures: Large Joint Inj Date/Time: 10/13/2016 10:29 AM Performed by: Suzan Slick Authorized by: Dondra Prader R   Consent Given by:  Patient Site marked: the procedure site was marked   Timeout: prior to procedure the correct patient, procedure, and site was verified   Indications:  Pain and diagnostic evaluation Location:  Knee Site:  L knee Needle Size:  22 G Needle Length:  1.5  inches Ultrasound Guidance: No   Fluoroscopic Guidance: No   Arthrogram: No   Medications:  1 mL lidocaine 1 %; 48 mg Hylan 48 MG/6ML Aspiration Attempted: No   Patient tolerance:  Patient tolerated the procedure well with no immediate complications    Clinical Data: No additional findings.  ROS:  All other systems negative, except as noted in the HPI. Review of Systems  Constitutional: Negative for chills and fever.  Musculoskeletal: Positive for arthralgias.    Objective: Vital Signs: There were no vitals taken for this visit.  Specialty Comments:  No specialty comments available.  PMFS History: Patient Active Problem List   Diagnosis Date Noted  . Femur fracture, right ([place]) 07/25/2014  . Closed femur fracture ([facility] [place]) 07/24/2014  . Periprosthetic fracture around internal prosthetic right hip joint ([place]) 07/05/2014  . Periprosth fracture around internal prosth r hip jt, sequela 07/02/2014  . H/O total hip arthroplasty 06/19/2014  . Hx of adenomatous polyp of colon 06/13/2014  . Hypothyroidism 06/06/2014   Past Medical History:  Diagnosis Date  . Arthritis   . Hx of adenomatous polyp of colon 06/13/2014  . Hypothyroidism   . Thyroid disease   . Tobacco abuse     Family History  Problem Relation Age of Onset  . Diabetes type II Mother   . Hypertension Mother   . Diabetes Mother   . Colon cancer Neg Hx   . Thyroid disease Neg Hx   . Stomach  cancer Neg Hx     Past Surgical History:  Procedure Laterality Date  . ORIF FEMUR FRACTURE Right 07/24/2014   Procedure: OPEN REDUCTION INTERNAL FIXATION (ORIF) DISTAL FEMUR FRACTURE;  Surgeon: Newt Minion, MD;  Location: [facility];  Service: Orthopedics;  Laterality: Right;  . ORIF PERIPROSTHETIC FRACTURE Right 07/02/2014   Procedure: Open Reduction Internal Fixation Femur Fracture, Revision Femur Total Hip Arthroplasty;  Surgeon: Newt Minion, MD;  Location: [facility];  Service: Orthopedics;  Laterality: Right;  . ROTATOR CUFF  REPAIR  09-2013  . TOTAL HIP ARTHROPLASTY Right 06/19/2014   Procedure: TOTAL HIP ARTHROPLASTY;  Surgeon: Newt Minion, MD;  Location: [facility];  Service: Orthopedics;  Laterality: Right;  Marland Kitchen VAGINAL HYSTERECTOMY  2001   complete   Social History   Occupational History  . Not on file.   Social History Main Topics  . Smoking status: Current Every Day Smoker    Packs/day: 0.50    Years: 30.00    Types: Cigarettes  . Smokeless tobacco: Never Used     Comment: Patient reports that she is gradually trying to quit  . Alcohol use 8.4 oz/week    14 Glasses of wine per week  . Drug use: No  . Sexual activity: Not on file

## 2016-11-03 DIAGNOSIS — H04123 Dry eye syndrome of bilateral lacrimal glands: Secondary | ICD-10-CM | POA: Diagnosis not present

## 2016-11-03 DIAGNOSIS — H25813 Combined forms of age-related cataract, bilateral: Secondary | ICD-10-CM | POA: Diagnosis not present

## 2016-11-03 DIAGNOSIS — H02831 Dermatochalasis of right upper eyelid: Secondary | ICD-10-CM | POA: Diagnosis not present

## 2016-11-03 DIAGNOSIS — H02834 Dermatochalasis of left upper eyelid: Secondary | ICD-10-CM | POA: Diagnosis not present

## 2016-11-03 DIAGNOSIS — H05243 Constant exophthalmos, bilateral: Secondary | ICD-10-CM | POA: Diagnosis not present

## 2016-11-29 DIAGNOSIS — H269 Unspecified cataract: Secondary | ICD-10-CM

## 2016-11-29 DIAGNOSIS — H25811 Combined forms of age-related cataract, right eye: Secondary | ICD-10-CM | POA: Diagnosis not present

## 2016-11-29 DIAGNOSIS — H2511 Age-related nuclear cataract, right eye: Secondary | ICD-10-CM | POA: Diagnosis not present

## 2016-11-29 HISTORY — DX: Unspecified cataract: H26.9

## 2016-12-01 ENCOUNTER — Encounter: Payer: Self-pay | Admitting: Internal Medicine

## 2016-12-07 ENCOUNTER — Ambulatory Visit (INDEPENDENT_AMBULATORY_CARE_PROVIDER_SITE_OTHER): Payer: Medicare HMO | Admitting: Internal Medicine

## 2016-12-07 ENCOUNTER — Encounter: Payer: Self-pay | Admitting: Internal Medicine

## 2016-12-07 VITALS — BP 120/60 | HR 79 | Temp 97.8°F | Resp 20 | Ht 67.0 in | Wt 189.6 lb

## 2016-12-07 DIAGNOSIS — Z0000 Encounter for general adult medical examination without abnormal findings: Secondary | ICD-10-CM

## 2016-12-07 DIAGNOSIS — Z79890 Hormone replacement therapy (postmenopausal): Secondary | ICD-10-CM | POA: Diagnosis not present

## 2016-12-07 DIAGNOSIS — E034 Atrophy of thyroid (acquired): Secondary | ICD-10-CM | POA: Diagnosis not present

## 2016-12-07 DIAGNOSIS — M2550 Pain in unspecified joint: Secondary | ICD-10-CM

## 2016-12-07 LAB — COMPLETE METABOLIC PANEL WITH GFR
AG Ratio: 1.3 (calc) (ref 1.0–2.5)
ALKALINE PHOSPHATASE (APISO): 57 U/L (ref 33–130)
ALT: 12 U/L (ref 6–29)
AST: 12 U/L (ref 10–35)
Albumin: 4 g/dL (ref 3.6–5.1)
BUN: 12 mg/dL (ref 7–25)
CO2: 26 mmol/L (ref 20–32)
CREATININE: 0.7 mg/dL (ref 0.50–0.99)
Calcium: 9.5 mg/dL (ref 8.6–10.4)
Chloride: 102 mmol/L (ref 98–110)
GFR, Est African American: 103 mL/min/{1.73_m2} (ref >=60)
GFR, Est Non African American: 89 mL/min/{1.73_m2} (ref >=60)
GLOBULIN: 3 g/dL (ref 1.9–3.7)
GLUCOSE: 98 mg/dL (ref 65–139)
Potassium: 5.1 mmol/L (ref 3.5–5.3)
SODIUM: 138 mmol/L (ref 135–146)
Total Bilirubin: 0.4 mg/dL (ref 0.2–1.2)
Total Protein: 7 g/dL (ref 6.1–8.1)

## 2016-12-07 LAB — LIPID PANEL
Cholesterol: 189 mg/dL (ref <200)
HDL: 66 mg/dL (ref >50)
LDL Cholesterol (Calc): 104 mg/dL (calc) — ABNORMAL HIGH
Non-HDL Cholesterol (Calc): 123 mg/dL (calc) (ref <130)
TRIGLYCERIDES: 101 mg/dL (ref <150)
Total CHOL/HDL Ratio: 2.9 (calc) (ref <5.0)

## 2016-12-07 LAB — TSH: TSH: 0.21 m[IU]/L — AB (ref 0.40–4.50)

## 2016-12-07 LAB — T4, FREE: FREE T4: 1.4 ng/dL (ref 0.8–1.8)

## 2016-12-07 NOTE — Patient Instructions (Signed)
Continue current medications as ordered  Will call with lab results  Follow up with specialists as scheduled  Follow up in 1 yr for CPE  Keeping You Healthy  Get These Tests  Blood Pressure- Have your blood pressure checked by your healthcare provider at least once a year.  Normal blood pressure is 120/80.  Weight- Have your body mass index (BMI) calculated to screen for obesity.  BMI is a measure of body fat based on height and weight.  You can calculate your own BMI at [employer]  Cholesterol- Have your cholesterol checked every year.  Diabetes- Have your blood sugar checked every year if you have high blood pressure, high cholesterol, a family history of diabetes or if you are overweight.  Pap Test - Have a pap test every 1 to 5 years if you have been sexually active.  If you are older than 65 and recent pap tests have been normal you may not need additional pap tests.  In addition, if you have had a hysterectomy  for benign disease additional pap tests are not necessary.  Mammogram-Yearly mammograms are essential for early detection of breast cancer  Screening for Colon Cancer- Colonoscopy starting at age 23. Screening may begin sooner depending on your family history and other health conditions.  Follow up colonoscopy as directed by your Gastroenterologist.  Screening for Osteoporosis- Screening begins at age 60 with bone density scanning, sooner if you are at higher risk for developing Osteoporosis.  Get these medicines  Calcium with Vitamin D- Your body requires 1200-1500 mg of Calcium a day and [phone] IU of Vitamin D a day.  You can only absorb 500 mg of Calcium at a time therefore Calcium must be taken in 2 or 3 separate doses throughout the day.  Hormones- Hormone therapy has been associated with increased risk for certain cancers and heart disease.  Talk to your healthcare provider about if you need relief from menopausal symptoms.  Aspirin- Ask your  healthcare provider about taking Aspirin to prevent Heart Disease and Stroke.  Get these Immuniztions  Flu shot- Every fall  Pneumonia shot- Once after the age of 78; if you are younger ask your healthcare provider if you need a pneumonia shot.  Tetanus- Every ten years.  Zostavax- Once after the age of 34 to prevent shingles.  Take these steps  Don't smoke- Your healthcare provider can help you quit. For tips on how to quit, ask your healthcare provider or go to www.smokefree.gov or call 1-800 QUIT-NOW.  Be physically active- Exercise 5 days a week for a minimum of 30 minutes.  If you are not already physically active, start slow and gradually work up to 30 minutes of moderate physical activity.  Try walking, dancing, bike riding, swimming, etc.  Eat a healthy diet- Eat a variety of healthy foods such as fruits, vegetables, whole grains, low fat milk, low fat cheeses, yogurt, lean meats, chicken, fish, eggs, dried beans, tofu, etc.  For more information go to www.thenutritionsource.org  Dental visit- Brush and floss teeth twice daily; visit your dentist twice a year.  Eye exam- Visit your Optometrist or Ophthalmologist yearly.  Drink alcohol in moderation- Limit alcohol intake to one drink or less a day.  Never drink and drive.  Depression- Your emotional health is as important as your physical health.  If you're feeling down or losing interest in things you normally enjoy, please talk to your healthcare provider.  Seat Belts- can save your life; always wear one  Smoke/Carbon Monoxide detectors- These detectors need to be installed on the appropriate level of your home.  Replace batteries at least once a year.  Violence- If anyone is threatening or hurting you, please tell your healthcare provider.  Living Will/ Health care power of attorney- Discuss with your healthcare provider and family.

## 2016-12-07 NOTE — Progress Notes (Signed)
Patient ID: Bianca Collins, female   DOB: 03-04-1946, 71 y.o.   MRN: [id number]   Location:  [facility]  Place of Service:  OFFICE  Provider: Arletha Grippe, DO  Patient Care Team: Gildardo Cranker, DO as PCP - General (Internal Medicine)  Extended Emergency Contact Information Primary Emergency Contact: [facility] Address: [address]          [place], [place] [address] [place] Phone: [phone] Work Phone: [phone] Mobile Phone: [phone] Relation: Daughter  Code Status: FULL CODE Goals of Care: Advanced Directive information Advanced Directives 12/07/2016  Does Patient Have a Medical Advance Directive? Yes  Type of Advance Directive [place]  Does patient want to make changes to medical advance directive? No - Patient declined  Copy of [place] in Chart? Yes  Would patient like information on creating a medical advance directive? -     Chief Complaint  Patient presents with  . Annual Exam    HPI: Patient is a 71 y.o. female seen in today for an annual wellness exam. No concerns today. She feels agitated due to her busy schedule today.   She declines pneumovax/prevnar/influenza/zostavax vaccines.   Hypothyroidism - dx in 2001. No longer sees Endo Dr Loanne Drilling. Takes levothyroxine. Never needed RAI. She has never had a goiter. TSH 0.39; T4 free 1.2  She had colonoscopy in 3/16 and adenomatous polyps removed. She needs a repeat study in 5 yrs (2021). No BRBPR/melena  Hot flashes due to postmenopausal - stable on estradiol. Followed by GYN Dr Nori Riis. Last mammogram in 2018. She gets DXA via GYN. Takes Vit D daily  Arthritis - had 3 surgeries on right hip last spring for THR, fx repair. She has seen Ortho Dr Sharol Given. No pain today  Tobacco abuse - quit smoking November 17, 2015.   Decreased vision -improved. Followed by Dr Katy Fitch. She is s/p OD cats with IOL on 11/29/16. She plans to have OS cats done soon.   Depression  screen [facility] 2/9 05/28/2015 04/18/2015  Decreased Interest 0 0  Down, Depressed, Hopeless 0 0  PHQ - 2 Score 0 0    Fall Risk  11/28/2015 05/28/2015 04/18/2015  Falls in the past year? No No No   MMSE - Mini Mental State Exam 12/07/2016 05/28/2015 05/28/2015  Not completed: - (No Data) (No Data)  Orientation to time 5 4 4   Orientation to Place 5 5 5   Registration 3 3 3   Attention/ Calculation 5 5 5   Recall 3 3 3   Language- name 2 objects 2 2 2   Language- repeat 1 1 1   Language- follow 3 step command 3 3 3   Language- read & follow direction 1 1 1   Write a sentence 1 1 1   Copy design 0 1 1  Total score 29 29 29      Health Maintenance  Topic Date Due  . Hepatitis C Screening  1945-12-22  . MAMMOGRAM  02/09/1998  . DEXA SCAN  02/09/2013  . INFLUENZA VACCINE  12/07/2017 (Originally 10/20/2016)  . PNA vac Low Risk Adult (1 of 2 - PCV13) 12/07/2017 (Originally 02/09/2013)  . TETANUS/TDAP  12/08/2026 (Originally 02/10/1967)  . COLONOSCOPY  06/07/2019    Urinary incontinence? No issues  Functional Status Survey: Is the patient deaf or have difficulty hearing?: No Does the patient have difficulty seeing, even when wearing glasses/contacts?: No Does the patient have difficulty concentrating, remembering, or making decisions?: No Does the patient have difficulty walking or climbing stairs?:  No Does the patient have difficulty dressing or bathing?: No Does the patient have difficulty doing errands alone such as visiting a doctor's office or shopping?: No  Exercise? As tolerated  Diet? Maintains healthy food choices  No exam data present  Hearing: no issues    Dentition: no concerns  Pain: stable  Past Medical History:  Diagnosis Date  . Arthritis   . Cataract 11/29/2016   rt eye   Dr Clent Jacks  . Hx of adenomatous polyp of colon 06/13/2014  . Hypothyroidism   . Thyroid disease   . Tobacco abuse     Past Surgical History:  Procedure Laterality Date  . ORIF FEMUR FRACTURE  Right 07/24/2014   Procedure: OPEN REDUCTION INTERNAL FIXATION (ORIF) DISTAL FEMUR FRACTURE;  Surgeon: Newt Minion, MD;  Location: [facility];  Service: Orthopedics;  Laterality: Right;  . ORIF PERIPROSTHETIC FRACTURE Right 07/02/2014   Procedure: Open Reduction Internal Fixation Femur Fracture, Revision Femur Total Hip Arthroplasty;  Surgeon: Newt Minion, MD;  Location: [place];  Service: Orthopedics;  Laterality: Right;  . ROTATOR CUFF REPAIR  09-2013  . TOTAL HIP ARTHROPLASTY Right 06/19/2014   Procedure: TOTAL HIP ARTHROPLASTY;  Surgeon: Newt Minion, MD;  Location: [place];  Service: Orthopedics;  Laterality: Right;  Marland Kitchen VAGINAL HYSTERECTOMY  2001   complete    Family History  Problem Relation Age of Onset  . Diabetes type II Mother   . Hypertension Mother   . Diabetes Mother   . Colon cancer Neg Hx   . Thyroid disease Neg Hx   . Stomach cancer Neg Hx    Family Status  Relation Status  . Mother Deceased at age 75  . Father Deceased  . Brother Alive  . Sister Alive  . Brother Alive  . Daughter Alive  . Son Alive  . Neg Hx (Not Specified)    Social History   Social History  . Marital status: Divorced    Spouse name: N/A  . Number of children: N/A  . Years of education: N/A   Occupational History  . Not on file.   Social History Main Topics  . Smoking status: Former Smoker    Packs/day: 0.50    Years: 30.00    Types: Cigarettes  . Smokeless tobacco: Never Used     Comment: Patient reports that she is gradually trying to quit  . Alcohol use 8.4 oz/week    14 Glasses of wine per week  . Drug use: No  . Sexual activity: Not on file   Other Topics Concern  . Not on file   Social History Narrative   Diet?       Do you drink/eat things with caffeine? Tries not to       Marital status?   divorced                                 What year were you married? 2001      Do you live in a house, apartment, assisted living, condo, trailer, etc.? house      Is it one or more  stories? no      How many persons live in your home? 4      Do you have any pets in your home? (please list) 3 cats      Current or past profession: Scientist, water quality, worked for [employer], Therapist, art  Do you exercise?       No Known Allergies  Allergies as of 12/07/2016   No Known Allergies     Medication List       Accurate as of 12/07/16 11:12 AM. Always use your most recent med list.          aspirin EC 81 MG tablet Take 81 mg by mouth daily.   estradiol 1 MG tablet Commonly known as:  ESTRACE Take 1 mg by mouth daily.   ketorolac 0.4 % Soln Commonly known as:  ACULAR Place 4 drops into the right eye daily.   levothyroxine 100 MCG tablet Commonly known as:  SYNTHROID, LEVOTHROID TAKE ONE TABLET BY MOUTH ONCE DAILY FOR  BREAKFAST   ofloxacin 0.3 % ophthalmic solution Commonly known as:  OCUFLOX Place 4 drops into the right eye daily.   prednisoLONE acetate 1 % ophthalmic suspension Commonly known as:  PRED FORTE Place 4 drops into the right eye daily.   vitamin C 1000 MG tablet Take 1,000 mg by mouth daily.   Vitamin D3 2000 units Tabs Take 1 tablet by mouth daily.            Discharge Care Instructions        Start     Ordered   12/07/16 0000  CMP with eGFR     12/07/16 1050   12/07/16 0000  TSH     12/07/16 1050   12/07/16 0000  T4, Free     12/07/16 1050   12/07/16 0000  Lipid Panel     12/07/16 1050       Review of Systems:  Review of Systems  Musculoskeletal: Negative for arthralgias.  Psychiatric/Behavioral: Positive for agitation (due to increased stressors).  All other systems reviewed and are negative.   Physical Exam: Vitals:   12/07/16 0945  BP: 120/60  Pulse: 79  Resp: 20  Temp: 97.8 F (36.6 C)  TempSrc: Oral  SpO2: 97%  Weight: 189 lb 9.6 oz (86 kg)  Height: 5' 7"  (1.702 m)   Body mass index is 29.7 kg/m. Physical Exam  Constitutional: She is oriented to person, place, and time. She appears  well-developed and well-nourished. No distress.  HENT:  Head: Normocephalic and atraumatic.  Right Ear: External ear normal.  Left Ear: External ear normal.  Mouth/Throat: Oropharynx is clear and moist. No oropharyngeal exudate.  MMM; no oral thrush  Eyes: Pupils are equal, round, and reactive to light. EOM are normal. No scleral icterus.  Neck: Normal range of motion. Neck supple. Carotid bruit is not present. No tracheal deviation present. No thyromegaly present.  Cardiovascular: Normal rate, regular rhythm and intact distal pulses.  Exam reveals no gallop and no friction rub.   No murmur heard. No LE edema b/l. No calf TTP  Pulmonary/Chest: Effort normal and breath sounds normal. No respiratory distress. She has no wheezes. She has no rhonchi. She has no rales. She exhibits no tenderness.  She did not undress for breast exam  Abdominal: Soft. Bowel sounds are normal. She exhibits no distension and no mass. There is no hepatosplenomegaly or hepatomegaly. There is no tenderness. There is no rebound and no guarding. No hernia.  Musculoskeletal: She exhibits no deformity.  Lymphadenopathy:    She has no cervical adenopathy.  Neurological: She is alert and oriented to person, place, and time. She has normal reflexes.  Skin: Skin is warm and dry. No rash noted.  Psychiatric: She has a normal mood and affect. Her  behavior is normal. Judgment normal.  Vitals reviewed.   Labs reviewed:  Basic Metabolic Panel: No results for input(s): NA, K, CL, CO2, GLUCOSE, BUN, CREATININE, CALCIUM, MG, PHOS, TSH in the last 8760 hours. Liver Function Tests: No results for input(s): AST, ALT, ALKPHOS, BILITOT, PROT, ALBUMIN in the last 8760 hours. No results for input(s): LIPASE, AMYLASE in the last 8760 hours. No results for input(s): AMMONIA in the last 8760 hours. CBC: No results for input(s): WBC, NEUTROABS, HGB, HCT, MCV, PLT in the last 8760 hours. Lipid Panel: No results for input(s): CHOL, HDL,  LDLCALC, TRIG, CHOLHDL, LDLDIRECT in the last 8760 hours. No results found for: HGBA1C  Procedures: No results found.  Assessment/Plan   ICD-10-CM   1. Well adult exam Z00.00 CMP with eGFR    Lipid Panel  2. Hypothyroidism due to acquired atrophy of thyroid E03.4 TSH    T4, Free  3. Postmenopausal HRT (hormone replacement therapy) Z79.890   4. Pain, joint, multiple sites M25.50      Continue current medications as ordered  Will call with lab results  Follow up with specialists as scheduled  Follow up in 1 yr for CPE  Keeping You Healthy handout given   Cordella Register. Perlie Gold  [facility] and [facility] [address] [place], [place] [address] [phone] Cell (Monday-Friday 8 AM - 5 PM) [phone] After 5 PM and follow prompts

## 2016-12-10 ENCOUNTER — Other Ambulatory Visit: Payer: Self-pay

## 2016-12-10 DIAGNOSIS — E039 Hypothyroidism, unspecified: Secondary | ICD-10-CM

## 2016-12-10 MED ORDER — LEVOTHYROXINE SODIUM 88 MCG PO TABS: 88.0000 ug | ORAL_TABLET | Freq: Every day | ORAL | 3 refills | Status: DC

## 2016-12-17 DIAGNOSIS — H2512 Age-related nuclear cataract, left eye: Secondary | ICD-10-CM | POA: Diagnosis not present

## 2016-12-27 DIAGNOSIS — H2512 Age-related nuclear cataract, left eye: Secondary | ICD-10-CM | POA: Diagnosis not present

## 2016-12-27 DIAGNOSIS — H268 Other specified cataract: Secondary | ICD-10-CM | POA: Diagnosis not present

## 2017-02-23 ENCOUNTER — Ambulatory Visit (INDEPENDENT_AMBULATORY_CARE_PROVIDER_SITE_OTHER): Payer: Medicare HMO | Admitting: Internal Medicine

## 2017-02-23 ENCOUNTER — Encounter: Payer: Self-pay | Admitting: Internal Medicine

## 2017-02-23 VITALS — BP 122/74 | HR 64 | Temp 97.5°F | Ht 67.0 in | Wt 189.6 lb

## 2017-02-23 DIAGNOSIS — H9191 Unspecified hearing loss, right ear: Secondary | ICD-10-CM

## 2017-02-23 DIAGNOSIS — H6123 Impacted cerumen, bilateral: Secondary | ICD-10-CM

## 2017-02-23 DIAGNOSIS — J302 Other seasonal allergic rhinitis: Secondary | ICD-10-CM | POA: Diagnosis not present

## 2017-02-23 NOTE — Progress Notes (Signed)
Patient ID: Bianca Collins, female   DOB: Oct 30, 1945, 71 y.o.   MRN: [id number]   [facility] OFFICE  Provider: DR Arletha Grippe  Code Status: FULL CODE Goals of Care:  Advanced Directives 12/07/2016  Does Patient Have a Medical Advance Directive? Yes  Type of Advance Directive [place]  Does patient want to make changes to medical advance directive? No - Patient declined  Copy of [place] in Chart? Yes  Would patient like information on creating a medical advance directive? -     Chief Complaint  Patient presents with  . Ear Problem    both ears fills full, no pain, started about 3 weeks    HPI: Patient is a 71 y.o. female seen today for an acute visit for ear pressure with decreasing hearing on right x 3 weeks. No pain. No d/c. No f/c. She tried peroxide x 1 week without relief. She has sinus pressure and post nasal drip. No sore throat. She has had similar sx's with cerumen impaction.  Past Medical History:  Diagnosis Date  . Arthritis   . Cataract 11/29/2016   rt eye   Dr Clent Jacks  . Hx of adenomatous polyp of colon 06/13/2014  . Hypothyroidism   . Thyroid disease   . Tobacco abuse     Past Surgical History:  Procedure Laterality Date  . ORIF FEMUR FRACTURE Right 07/24/2014   Procedure: OPEN REDUCTION INTERNAL FIXATION (ORIF) DISTAL FEMUR FRACTURE;  Surgeon: Newt Minion, MD;  Location: [place] [place];  Service: Orthopedics;  Laterality: Right;  . ORIF PERIPROSTHETIC FRACTURE Right 07/02/2014   Procedure: Open Reduction Internal Fixation Femur Fracture, Revision Femur Total Hip Arthroplasty;  Surgeon: Newt Minion, MD;  Location: [place];  Service: Orthopedics;  Laterality: Right;  . ROTATOR CUFF REPAIR  09-2013  . TOTAL HIP ARTHROPLASTY Right 06/19/2014   Procedure: TOTAL HIP ARTHROPLASTY;  Surgeon: Newt Minion, MD;  Location: [place];  Service: Orthopedics;  Laterality: Right;  Marland Kitchen VAGINAL HYSTERECTOMY  2001   complete    No Known  Allergies  Outpatient Encounter Medications as of 02/23/2017  Medication Sig  . Ascorbic Acid (VITAMIN C) 1000 MG tablet Take 1,000 mg by mouth daily.  Marland Kitchen aspirin EC 81 MG tablet Take 81 mg by mouth daily.  . Cholecalciferol (VITAMIN D3) 2000 units TABS Take 1 tablet by mouth daily.  Marland Kitchen estradiol (ESTRACE) 1 MG tablet Take 1 mg by mouth daily.  Marland Kitchen levothyroxine (SYNTHROID, LEVOTHROID) 88 MCG tablet Take 1 tablet (88 mcg total) by mouth daily before breakfast.  . [DISCONTINUED] ketorolac (ACULAR) 0.4 % SOLN Place 4 drops into the right eye daily.  . [DISCONTINUED] ofloxacin (OCUFLOX) 0.3 % ophthalmic solution Place 4 drops into the right eye daily.  . [DISCONTINUED] prednisoLONE acetate (PRED FORTE) 1 % ophthalmic suspension Place 4 drops into the right eye daily.   No facility-administered encounter medications on file as of 02/23/2017.     Review of Systems:  Review of Systems  HENT: Positive for hearing loss, postnasal drip, rhinorrhea and sinus pressure. Negative for ear discharge, ear pain, facial swelling, sinus pain, sore throat and tinnitus.   Eyes: Negative for pain and visual disturbance.  Respiratory: Negative for cough, shortness of breath and wheezing.   Cardiovascular: Negative for chest pain.  Gastrointestinal: Negative for nausea and vomiting.  Skin: Negative for rash.  Allergic/Immunologic: Positive for environmental allergies.  Neurological: Negative for dizziness and headaches.    Health Maintenance  Topic Date Due  . Hepatitis C Screening  12-31-1945  . MAMMOGRAM  02/09/1998  . DEXA SCAN  02/09/2013  . INFLUENZA VACCINE  12/07/2017 (Originally 10/20/2016)  . PNA vac Low Risk Adult (1 of 2 - PCV13) 12/07/2017 (Originally 02/09/2013)  . TETANUS/TDAP  12/08/2026 (Originally 02/10/1967)  . COLONOSCOPY  06/07/2019    Physical Exam: Vitals:   02/23/17 0930  BP: 122/74  Pulse: 64  Temp: (!) 97.5 F (36.4 C)  TempSrc: Oral  SpO2: 96%  Weight: 189 lb 9.6 oz (86 kg)   Height: 5\' 7"  (1.702 m)   Body mass index is 29.7 kg/m. Physical Exam  Constitutional: She is oriented to person, place, and time. She appears well-developed and well-nourished.  Looks well in NAD  HENT:  Mouth/Throat: No oropharyngeal exudate.  B/l cerumen impaction; no sinus TTP; maxillary sinus boggy tissue texture changes; oropharynx cobblestoning but no redness or exudate; MMM; no oral thrush  Eyes: Pupils are equal, round, and reactive to light. No scleral icterus.  Neck: Neck supple.  Lymphadenopathy:       Head (right side): No submandibular and no occipital adenopathy present.       Head (left side): No submandibular and no occipital adenopathy present.    She has no cervical adenopathy.       Right: No supraclavicular adenopathy present.       Left: No supraclavicular adenopathy present.  Neurological: She is alert and oriented to person, place, and time.  Skin: Skin is warm and dry. No rash noted.  Psychiatric: She has a normal mood and affect. Her behavior is normal. Judgment and thought content normal.    Labs reviewed: Basic Metabolic Panel: Recent Labs    12/07/16 1057  NA 138  K 5.1  CL 102  CO2 26  GLUCOSE 98  BUN 12  CREATININE 0.70  CALCIUM 9.5  TSH 0.21*   Liver Function Tests: Recent Labs    12/07/16 1057  AST 12  ALT 12  BILITOT 0.4  PROT 7.0   No results for input(s): LIPASE, AMYLASE in the last 8760 hours. No results for input(s): AMMONIA in the last 8760 hours. CBC: No results for input(s): WBC, NEUTROABS, HGB, HCT, MCV, PLT in the last 8760 hours. Lipid Panel: Recent Labs    12/07/16 1057  CHOL 189  HDL 66  TRIG 101  CHOLHDL 2.9   No results found for: HGBA1C  Procedures since last visit: No results found.  Assessment/Plan   ICD-10-CM   1. Bilateral impacted cerumen H61.23 Ear Lavage  2. Hearing loss of right ear, unspecified hearing loss type H91.91   3. Seasonal allergies J30.2     Ear lavage done. curette used to  remove remaining cerumen with success. TM intact with no redness or bulging. Min bleeding external ear canal but no d/c. Pt tolerated procedure well. Hearing loss resolved  Continue current medications as ordered  Follow up as scheduled   Jeziel Hoffmann S. Perlie Gold  [facility] and Adult Medicine [address] [place], [place] [address] [phone] Cell (Monday-Friday 8 AM - 5 PM) [phone] After 5 PM and follow prompts

## 2017-02-23 NOTE — Patient Instructions (Signed)
Ear lavage done today  Continue current medications as ordered  Follow up as scheduled

## 2017-02-25 ENCOUNTER — Telehealth: Payer: Self-pay

## 2017-02-25 ENCOUNTER — Other Ambulatory Visit: Payer: Self-pay

## 2017-02-25 NOTE — Telephone Encounter (Signed)
Left VM for patient to let her know that she can still come and get her blood work done anytime today before 11:30 am

## 2017-02-25 NOTE — Telephone Encounter (Signed)
Patient returned call, she totally forgot about appointment.   Lab appointment rescheduled to 03/07/17

## 2017-03-07 ENCOUNTER — Other Ambulatory Visit: Payer: Medicare HMO

## 2017-03-07 DIAGNOSIS — E039 Hypothyroidism, unspecified: Secondary | ICD-10-CM | POA: Diagnosis not present

## 2017-03-07 LAB — TSH: TSH: 0.97 m[IU]/L (ref 0.40–4.50)

## 2017-03-30 DIAGNOSIS — H10413 Chronic giant papillary conjunctivitis, bilateral: Secondary | ICD-10-CM | POA: Diagnosis not present

## 2017-03-30 DIAGNOSIS — H04123 Dry eye syndrome of bilateral lacrimal glands: Secondary | ICD-10-CM | POA: Diagnosis not present

## 2017-05-04 ENCOUNTER — Ambulatory Visit (INDEPENDENT_AMBULATORY_CARE_PROVIDER_SITE_OTHER): Payer: Medicare HMO | Admitting: Nurse Practitioner

## 2017-05-04 ENCOUNTER — Encounter: Payer: Self-pay | Admitting: Nurse Practitioner

## 2017-05-04 VITALS — BP 124/82 | HR 72 | Temp 98.6°F | Ht 67.0 in | Wt 189.0 lb

## 2017-05-04 DIAGNOSIS — J0140 Acute pansinusitis, unspecified: Secondary | ICD-10-CM

## 2017-05-04 MED ORDER — AMOXICILLIN-POT CLAVULANATE 875-125 MG PO TABS: 1.0000 | ORAL_TABLET | Freq: Two times a day (BID) | ORAL | 0 refills | Status: DC

## 2017-05-04 NOTE — Patient Instructions (Signed)
neti pot twice daily Plain nasal saline spray throughout the day as needed humidifier in the home to help with the dry air Keep well hydrated Avoid forcefully blowing nose   Sinusitis, Adult Sinusitis is soreness and inflammation of your sinuses. Sinuses are hollow spaces in the bones around your face. Your sinuses are located:  Around your eyes.  In the middle of your forehead.  Behind your nose.  In your cheekbones.  Your sinuses and nasal passages are lined with a stringy fluid (mucus). Mucus normally drains out of your sinuses. When your nasal tissues become inflamed or swollen, the mucus can become trapped or blocked so air cannot flow through your sinuses. This allows bacteria, viruses, and funguses to grow, which leads to infection. Sinusitis can develop quickly and last for 7?10 days (acute) or for more than 12 weeks (chronic). Sinusitis often develops after a cold. What are the causes? This condition is caused by anything that creates swelling in the sinuses or stops mucus from draining, including:  Allergies.  Asthma.  Bacterial or viral infection.  Abnormally shaped bones between the nasal passages.  Nasal growths that contain mucus (nasal polyps).  Narrow sinus openings.  Pollutants, such as chemicals or irritants in the air.  A foreign object stuck in the nose.  A fungal infection. This is rare.  What increases the risk? The following factors may make you more likely to develop this condition:  Having allergies or asthma.  Having had a recent cold or respiratory tract infection.  Having structural deformities or blockages in your nose or sinuses.  Having a weak immune system.  Doing a lot of swimming or diving.  Overusing nasal sprays.  Smoking.  What are the signs or symptoms? The main symptoms of this condition are pain and a feeling of pressure around the affected sinuses. Other symptoms include:  Upper  toothache.  Earache.  Headache.  Bad breath.  Decreased sense of smell and taste.  A cough that may get worse at night.  Fatigue.  Fever.  Thick drainage from your nose. The drainage is often green and it may contain pus (purulent).  Stuffy nose or congestion.  Postnasal drip. This is when extra mucus collects in the throat or back of the nose.  Swelling and warmth over the affected sinuses.  Sore throat.  Sensitivity to light.  How is this diagnosed? This condition is diagnosed based on symptoms, a medical history, and a physical exam. To find out if your condition is acute or chronic, your health care provider may:  Look in your nose for signs of nasal polyps.  Tap over the affected sinus to check for signs of infection.  View the inside of your sinuses using an imaging device that has a light attached (endoscope).  If your health care provider suspects that you have chronic sinusitis, you may also:  Be tested for allergies.  Have a sample of mucus taken from your nose (nasal culture) and checked for bacteria.  Have a mucus sample examined to see if your sinusitis is related to an allergy.  If your sinusitis does not respond to treatment and it lasts longer than 8 weeks, you may have an MRI or CT scan to check your sinuses. These scans also help to determine how severe your infection is. In rare cases, a bone biopsy may be done to rule out more serious types of fungal sinus disease. How is this treated? Treatment for sinusitis depends on the cause and whether your  condition is chronic or acute. If a virus is causing your sinusitis, your symptoms will go away on their own within 10 days. You may be given medicines to relieve your symptoms, including:  Topical nasal decongestants. They shrink swollen nasal passages and let mucus drain from your sinuses.  Antihistamines. These drugs block inflammation that is triggered by allergies. This can help to ease swelling in  your nose and sinuses.  Topical nasal corticosteroids. These are nasal sprays that ease inflammation and swelling in your nose and sinuses.  Nasal saline washes. These rinses can help to get rid of thick mucus in your nose.  If your condition is caused by bacteria, you will be given an antibiotic medicine. If your condition is caused by a fungus, you will be given an antifungal medicine. Surgery may be needed to correct underlying conditions, such as narrow nasal passages. Surgery may also be needed to remove polyps. Follow these instructions at home: Medicines  Take, use, or apply over-the-counter and prescription medicines only as told by your health care provider. These may include nasal sprays.  If you were prescribed an antibiotic medicine, take it as told by your health care provider. Do not stop taking the antibiotic even if you start to feel better. Hydrate and Humidify  Drink enough water to keep your urine clear or pale yellow. Staying hydrated will help to thin your mucus.  Use a cool mist humidifier to keep the humidity level in your home above 50%.  Inhale steam for 10-15 minutes, 3-4 times a day or as told by your health care provider. You can do this in the bathroom while a hot shower is running.  Limit your exposure to cool or dry air. Rest  Rest as much as possible.  Sleep with your head raised (elevated).  Make sure to get enough sleep each night. General instructions  Apply a warm, moist washcloth to your face 3-4 times a day or as told by your health care provider. This will help with discomfort.  Wash your hands often with soap and water to reduce your exposure to viruses and other germs. If soap and water are not available, use hand sanitizer.  Do not smoke. Avoid being around people who are smoking (secondhand smoke).  Keep all follow-up visits as told by your health care provider. This is important. Contact a health care provider if:  You have a  fever.  Your symptoms get worse.  Your symptoms do not improve within 10 days. Get help right away if:  You have a severe headache.  You have persistent vomiting.  You have pain or swelling around your face or eyes.  You have vision problems.  You develop confusion.  Your neck is stiff.  You have trouble breathing. This information is not intended to replace advice given to you by your health care provider. Make sure you discuss any questions you have with your health care provider. Document Released: 03/08/2005 Document Revised: 11/02/2015 Document Reviewed: 01/01/2015 Elsevier Interactive Patient Education  [employer].

## 2017-05-04 NOTE — Progress Notes (Signed)
Careteam: Patient Care Team: Gildardo Cranker, DO as PCP - General (Internal Medicine)  Advanced Directive information    No Known Allergies  Chief Complaint  Patient presents with  . Acute Visit    Pt is being seen due to sinus pressure/headache for several weeks.      HPI: Patient is a 72 y.o. female former smoker for 30 years that quit in 2017.   Presents today with complaints of sinus pressure and headaches for about 3 weeks.  Frontal headaches, mostly constant that she describes as dull or aching. Rates the headaches at 4/10 most of the time and it worsens with bending over to pick up items.  Has been taking OTC sinus medication, doesn't recall the name, that has provided some relief of the sinus pressure.  Denies photophobia, nausea, vomiting, or dizziness.  Endorses blowing green or clear  Sputum from nose, coughing up clear sputum.  Denies being around sick contacts.  Denies fever, chills or poor appetite.  "I just don't feel good".    Health Maintenance: pt not up to date on flu or pneumonia vaccines.  States she doesn't believe in getting vaccinated.  Review of Systems:  Review of Systems  Constitutional: Negative for chills, fever and malaise/fatigue.  HENT: Positive for congestion and sinus pain. Negative for ear discharge, ear pain, hearing loss, nosebleeds and tinnitus.        Feels pressure over sinuses  Eyes: Negative.   Respiratory: Positive for cough. Negative for hemoptysis, shortness of breath and wheezing.   Gastrointestinal: Negative for abdominal pain, diarrhea, heartburn, nausea and vomiting.  Musculoskeletal: Negative.   Neurological: Positive for headaches. Negative for dizziness, tingling, sensory change, speech change, focal weakness and weakness.  Psychiatric/Behavioral: Negative.     Past Medical History:  Diagnosis Date  . Arthritis   . Cataract 11/29/2016   rt eye   Dr Clent Jacks  . Hx of adenomatous polyp of colon 06/13/2014  .  Hypothyroidism   . Thyroid disease   . Tobacco abuse    Past Surgical History:  Procedure Laterality Date  . ORIF FEMUR FRACTURE Right 07/24/2014   Procedure: OPEN REDUCTION INTERNAL FIXATION (ORIF) DISTAL FEMUR FRACTURE;  Surgeon: Newt Minion, MD;  Location: [facility];  Service: Orthopedics;  Laterality: Right;  . ORIF PERIPROSTHETIC FRACTURE Right 07/02/2014   Procedure: Open Reduction Internal Fixation Femur Fracture, Revision Femur Total Hip Arthroplasty;  Surgeon: Newt Minion, MD;  Location: [facility];  Service: Orthopedics;  Laterality: Right;  . ROTATOR CUFF REPAIR  09-2013  . TOTAL HIP ARTHROPLASTY Right 06/19/2014   Procedure: TOTAL HIP ARTHROPLASTY;  Surgeon: Newt Minion, MD;  Location: [employer];  Service: Orthopedics;  Laterality: Right;  Marland Kitchen VAGINAL HYSTERECTOMY  2001   complete   Social History:   reports that she has quit smoking. Her smoking use included cigarettes. She has a 15.00 pack-year smoking history. she has never used smokeless tobacco. She reports that she drinks about 8.4 oz of alcohol per week. She reports that she does not use drugs.  Family History  Problem Relation Age of Onset  . Diabetes type II Mother   . Hypertension Mother   . Diabetes Mother   . Colon cancer Neg Hx   . Thyroid disease Neg Hx   . Stomach cancer Neg Hx     Medications: Patient's Medications  New Prescriptions   No medications on file  Previous Medications   ASCORBIC ACID (VITAMIN C) 1000 MG TABLET  Take 1,000 mg by mouth daily.   ASPIRIN EC 81 MG TABLET    Take 81 mg by mouth daily.   CHOLECALCIFEROL (VITAMIN D3) 2000 UNITS TABS    Take 1 tablet by mouth daily.   ESTRADIOL (ESTRACE) 1 MG TABLET    Take 1 mg by mouth daily.   LEVOTHYROXINE (SYNTHROID, LEVOTHROID) 88 MCG TABLET    Take 1 tablet (88 mcg total) by mouth daily before breakfast.  Modified Medications   No medications on file  Discontinued Medications   No medications on file     Physical Exam:  Vitals:   05/04/17  1312  BP: 124/82  Pulse: 72  Temp: 98.6 F (37 C)  TempSrc: Oral  SpO2: 97%  Weight: 189 lb (85.7 kg)  Height: 5\' 7"  (1.702 m)   Body mass index is 29.6 kg/m.  Physical Exam  Constitutional: She is oriented to person, place, and time. She appears well-developed and well-nourished.  HENT:  Head: Normocephalic and atraumatic.  Right Ear: Hearing, tympanic membrane, external ear and ear canal normal.  Left Ear: Hearing, tympanic membrane, external ear and ear canal normal.  Nose: Mucosal edema present. Right sinus exhibits maxillary sinus tenderness. Right sinus exhibits no frontal sinus tenderness. Left sinus exhibits maxillary sinus tenderness. Left sinus exhibits no frontal sinus tenderness.  Mouth/Throat: Uvula is midline, oropharynx is clear and moist and mucous membranes are normal.  Eyes: EOM are normal. Pupils are equal, round, and reactive to light.  Neck: Normal range of motion. Neck supple.  Cardiovascular: Normal rate, regular rhythm and normal heart sounds.  Pulmonary/Chest: Effort normal and breath sounds normal. She has no wheezes.  Abdominal: Soft. Bowel sounds are normal.  Musculoskeletal: Normal range of motion.  Lymphadenopathy:    She has no cervical adenopathy.  Neurological: She is alert and oriented to person, place, and time.  Skin: Skin is warm and dry.  Psychiatric: She has a normal mood and affect. Her behavior is normal. Judgment and thought content normal.  Nursing note and vitals reviewed.   Labs reviewed: Basic Metabolic Panel: Recent Labs    12/07/16 1057 03/07/17 0851  NA 138  --   K 5.1  --   CL 102  --   CO2 26  --   GLUCOSE 98  --   BUN 12  --   CREATININE 0.70  --   CALCIUM 9.5  --   TSH 0.21* 0.97   Liver Function Tests: Recent Labs    12/07/16 1057  AST 12  ALT 12  BILITOT 0.4  PROT 7.0   No results for input(s): LIPASE, AMYLASE in the last 8760 hours. No results for input(s): AMMONIA in the last 8760 hours. CBC: No  results for input(s): WBC, NEUTROABS, HGB, HCT, MCV, PLT in the last 8760 hours. Lipid Panel: Recent Labs    12/07/16 1057  CHOL 189  HDL 66  TRIG 101  CHOLHDL 2.9   TSH: Recent Labs    12/07/16 1057 03/07/17 0851  TSH 0.21* 0.97   A1C: No results found for: HGBA1C   Assessment/Plan 1. Acute non-recurrent pansinusitis - amoxicillin-clavulanate (AUGMENTIN) 875-125 MG tablet; Take 1 tablet by mouth 2 (two) times daily.  Dispense: 14 tablet; Refill: 0 Take Augmentin with 8 oz of water Complete full course of Augmentin Encouraged to take probiotic while on Augmentin Neti pot twice daily Plain nasal saline spray throughout the day as needed Humidifier in the home to help with dry air Keep well hydrated Avoid  forcefully blowing nose  Return precautions discussed  Carlos American. Harle Battiest  [facility] [phone] 8 am - 5 pm) [phone] (after hours)

## 2017-05-08 ENCOUNTER — Other Ambulatory Visit: Payer: Self-pay | Admitting: Internal Medicine

## 2017-05-10 ENCOUNTER — Other Ambulatory Visit: Payer: Self-pay | Admitting: Internal Medicine

## 2017-05-31 ENCOUNTER — Telehealth: Payer: Self-pay | Admitting: Internal Medicine

## 2017-05-31 NOTE — Telephone Encounter (Signed)
Left message asking the patient to call me at [phone] to schedule AWV-I. VDM (DD)

## 2017-06-06 NOTE — Telephone Encounter (Signed)
I called the patient to schedule her AWV.  She declined and stated that she doesn't want to participate. VDM (DD)

## 2017-08-20 ENCOUNTER — Other Ambulatory Visit: Payer: Self-pay | Admitting: Internal Medicine

## 2017-11-09 ENCOUNTER — Encounter: Payer: Self-pay | Admitting: Internal Medicine

## 2017-11-22 DIAGNOSIS — H04123 Dry eye syndrome of bilateral lacrimal glands: Secondary | ICD-10-CM | POA: Diagnosis not present

## 2017-11-22 DIAGNOSIS — Z961 Presence of intraocular lens: Secondary | ICD-10-CM | POA: Diagnosis not present

## 2017-11-22 DIAGNOSIS — H02834 Dermatochalasis of left upper eyelid: Secondary | ICD-10-CM | POA: Diagnosis not present

## 2017-11-22 DIAGNOSIS — H02831 Dermatochalasis of right upper eyelid: Secondary | ICD-10-CM | POA: Diagnosis not present

## 2017-11-24 ENCOUNTER — Ambulatory Visit: Payer: Medicare HMO | Admitting: Family

## 2017-12-02 ENCOUNTER — Ambulatory Visit (INDEPENDENT_AMBULATORY_CARE_PROVIDER_SITE_OTHER): Payer: Medicare HMO | Admitting: Podiatry

## 2017-12-02 ENCOUNTER — Ambulatory Visit (INDEPENDENT_AMBULATORY_CARE_PROVIDER_SITE_OTHER): Payer: Medicare HMO

## 2017-12-02 DIAGNOSIS — M778 Other enthesopathies, not elsewhere classified: Secondary | ICD-10-CM

## 2017-12-02 DIAGNOSIS — M722 Plantar fascial fibromatosis: Secondary | ICD-10-CM

## 2017-12-02 DIAGNOSIS — M7731 Calcaneal spur, right foot: Secondary | ICD-10-CM

## 2017-12-02 DIAGNOSIS — M216X9 Other acquired deformities of unspecified foot: Secondary | ICD-10-CM | POA: Diagnosis not present

## 2017-12-02 DIAGNOSIS — M79671 Pain in right foot: Secondary | ICD-10-CM | POA: Diagnosis not present

## 2017-12-02 DIAGNOSIS — M779 Enthesopathy, unspecified: Secondary | ICD-10-CM | POA: Diagnosis not present

## 2017-12-02 MED ORDER — MELOXICAM 7.5 MG PO TABS: 7.5000 mg | ORAL_TABLET | Freq: Every day | ORAL | 0 refills | Status: DC

## 2017-12-02 NOTE — Progress Notes (Signed)
  Subjective:  Patient ID: Bianca Collins, female    DOB: [birth year],  MRN: [id number]  Chief Complaint  Patient presents with  . Foot Pain    right heel pain   72 y.o. female presents with the above complaint. Reports right heel pain for 7 or 8 weeks. Denies prior treatments. Thought it was just a stone bruise.   Review of Systems: Negative except as noted in the HPI. Denies N/V/F/Ch.  Past Medical History:  Diagnosis Date  . Arthritis   . Cataract 11/29/2016   rt eye   Dr Clent Jacks  . Hx of adenomatous polyp of colon 06/13/2014  . Hypothyroidism   . Thyroid disease   . Tobacco abuse     Current Outpatient Medications:  .  amoxicillin-clavulanate (AUGMENTIN) 875-125 MG tablet, Take 1 tablet by mouth 2 (two) times daily., Disp: 14 tablet, Rfl: 0 .  Ascorbic Acid (VITAMIN C) 1000 MG tablet, Take 1,000 mg by mouth daily., Disp: , Rfl:  .  aspirin EC 81 MG tablet, Take 81 mg by mouth daily., Disp: , Rfl:  .  Cholecalciferol (VITAMIN D3) 2000 units TABS, Take 1 tablet by mouth daily., Disp: , Rfl:  .  estradiol (ESTRACE) 1 MG tablet, Take 1 mg by mouth daily., Disp: , Rfl:  .  levothyroxine (SYNTHROID, LEVOTHROID) 88 MCG tablet, TAKE 1 TABLET BY MOUTH EVERY DAY BEFORE BREAKFAST, Disp: 90 tablet, Rfl: 1  Social History   Tobacco Use  Smoking Status Former Smoker  . Packs/day: 0.50  . Years: 30.00  . Pack years: 15.00  . Types: Cigarettes  Smokeless Tobacco Never Used  Tobacco Comment   Patient reports that she is gradually trying to quit    No Known Allergies Objective:  There were no vitals filed for this visit. There is no height or weight on file to calculate BMI. Constitutional Well developed. Well nourished.  Vascular Dorsalis pedis pulses palpable bilaterally. Posterior tibial pulses palpable bilaterally. Capillary refill normal to all digits.  No cyanosis or clubbing noted. Pedal hair growth normal.  Neurologic Normal speech. Oriented to person, place, and  time. Epicritic sensation to light touch grossly present bilaterally.  Dermatologic Nails well groomed and normal in appearance. No open wounds. No skin lesions.  Orthopedic: Normal joint ROM without pain or crepitus bilaterally. No visible deformities. Tender to palpation at the calcaneal tuber right. No pain with calcaneal squeeze right. Ankle ROM diminished range of motion right. Silfverskiold Test: positive right.   Radiographs: Taken and reviewed. No acute fractures or dislocations. No evidence of stress fracture.  Plantar heel spur present. Posterior heel spur present.   Assessment:   1. Plantar fasciitis   2. Equinus deformity of foot   3. Heel spur, right   4. Pain of right heel    Plan:  Patient was evaluated and treated and all questions answered.  Plantar Fasciitis, right - XR reviewed as above.  - Educated on icing and stretching. Instructions given.  - Injection delivered to the plantar fascia as below. - DME: Night splint dispensed. - Pharmacologic management: Meloxicam. Educated on risks/benefits and proper taking of medication.  Procedure: Injection Tendon/Ligament Location: Right plantar fascia at the glabrous junction; medial approach. Skin Prep: alcohol Injectate: 1 cc 0.5% marcaine plain, 1 cc dexamethasone phosphate, 0.5 cc kenalog 10. Disposition: Patient tolerated procedure well. Injection site dressed with a band-aid.  Return in about 3 weeks (around 12/23/2017) for Plantar fasciitis, Right.

## 2017-12-02 NOTE — Patient Instructions (Signed)

## 2017-12-28 DIAGNOSIS — Z01419 Encounter for gynecological examination (general) (routine) without abnormal findings: Secondary | ICD-10-CM | POA: Diagnosis not present

## 2017-12-28 DIAGNOSIS — Z6831 Body mass index (BMI) 31.0-31.9, adult: Secondary | ICD-10-CM | POA: Diagnosis not present

## 2017-12-28 DIAGNOSIS — Z1231 Encounter for screening mammogram for malignant neoplasm of breast: Secondary | ICD-10-CM | POA: Diagnosis not present

## 2017-12-29 ENCOUNTER — Ambulatory Visit (INDEPENDENT_AMBULATORY_CARE_PROVIDER_SITE_OTHER): Payer: Medicare HMO | Admitting: Podiatry

## 2017-12-29 DIAGNOSIS — M722 Plantar fascial fibromatosis: Secondary | ICD-10-CM

## 2017-12-29 NOTE — Progress Notes (Signed)
  Subjective:  Patient ID: Bianca Collins, female    DOB: 1945-11-19,  MRN: [id number]  Chief Complaint  Patient presents with  . Plantar Fasciitis    3 week follow up. Pt states much improvement after injection in right. Pt states still has some pain but is off and on.    72 y.o. female presents with the above complaint. States the injection helped a lot. Still has some pain because it's not constant. Pain comes and goes.  Review of Systems: Negative except as noted in the HPI. Denies N/V/F/Ch.  Past Medical History:  Diagnosis Date  . Arthritis   . Cataract 11/29/2016   rt eye   Dr Clent Jacks  . Hx of adenomatous polyp of colon 06/13/2014  . Hypothyroidism   . Thyroid disease   . Tobacco abuse     Current Outpatient Medications:  .  amoxicillin-clavulanate (AUGMENTIN) 875-125 MG tablet, Take 1 tablet by mouth 2 (two) times daily., Disp: 14 tablet, Rfl: 0 .  Ascorbic Acid (VITAMIN C) 1000 MG tablet, Take 1,000 mg by mouth daily., Disp: , Rfl:  .  aspirin EC 81 MG tablet, Take 81 mg by mouth daily., Disp: , Rfl:  .  Cholecalciferol (VITAMIN D3) 2000 units TABS, Take 1 tablet by mouth daily., Disp: , Rfl:  .  estradiol (ESTRACE) 1 MG tablet, Take 1 mg by mouth daily., Disp: , Rfl:  .  levothyroxine (SYNTHROID, LEVOTHROID) 88 MCG tablet, TAKE 1 TABLET BY MOUTH EVERY DAY BEFORE BREAKFAST, Disp: 90 tablet, Rfl: 1 .  meloxicam (MOBIC) 7.5 MG tablet, Take 1 tablet (7.5 mg total) by mouth daily., Disp: 30 tablet, Rfl: 0  Social History   Tobacco Use  Smoking Status Former Smoker  . Packs/day: 0.50  . Years: 30.00  . Pack years: 15.00  . Types: Cigarettes  Smokeless Tobacco Never Used  Tobacco Comment   Patient reports that she is gradually trying to quit    No Known Allergies Objective:  There were no vitals filed for this visit. There is no height or weight on file to calculate BMI. Constitutional Well developed. Well nourished.  Vascular Dorsalis pedis pulses palpable  bilaterally. Posterior tibial pulses palpable bilaterally. Capillary refill normal to all digits.  No cyanosis or clubbing noted. Pedal hair growth normal.  Neurologic Normal speech. Oriented to person, place, and time. Epicritic sensation to light touch grossly present bilaterally.  Dermatologic Nails well groomed and normal in appearance. No open wounds. No skin lesions.  Orthopedic: Normal joint ROM without pain or crepitus bilaterally. No visible deformities. Tender to palpation at the calcaneal tuber right. No pain with calcaneal squeeze right. Ankle ROM diminished range of motion right. Silfverskiold Test: positive right.   Radiographs:  9/13: No acute fractures or dislocations. No evidence of stress fracture.  Plantar heel spur present. Posterior heel spur present.   Assessment:   1. Plantar fasciitis    Plan:  Patient was evaluated and treated and all questions answered.  Plantar Fasciitis, right - Injection #2   Procedure: Injection Tendon/Ligament Consent: Verbal consent obtained. Location: Right plantar fascia at the glabrous junction; medial approach. Skin Prep: Alcohol. Injectate: 1 cc 0.5% marcaine plain, 1 cc dexamethasone phosphate, 0.5 cc kenalog 10. Disposition: Patient tolerated procedure well. Injection site dressed with a band-aid.  Return in about 6 weeks (around 02/09/2018) for Plantar fasciitis, Right.

## 2018-02-09 ENCOUNTER — Ambulatory Visit: Payer: Medicare HMO | Admitting: Podiatry

## 2018-02-10 ENCOUNTER — Other Ambulatory Visit: Payer: Self-pay | Admitting: *Deleted

## 2018-02-10 MED ORDER — LEVOTHYROXINE SODIUM 88 MCG PO TABS: ORAL_TABLET | ORAL | 0 refills | Status: DC

## 2018-02-10 NOTE — Telephone Encounter (Signed)
[facility]  Patient needs an appointment before anymore future refills. Noted on Rx.

## 2018-02-19 ENCOUNTER — Other Ambulatory Visit: Payer: Self-pay | Admitting: Nurse Practitioner

## 2018-02-20 NOTE — Telephone Encounter (Signed)
Spoke with patient, patient aware she needs an appointment. Patient states she does not need a refill right now (just picked up refill). Patient was not at home at the time of call, and plans to return to schedule an appointment with Sherrie Mustache, NP

## 2018-02-21 ENCOUNTER — Other Ambulatory Visit: Payer: Self-pay | Admitting: Nurse Practitioner

## 2018-02-21 NOTE — Telephone Encounter (Signed)
Last Exam OV 12/07/2016   Called patient to schedule OV .Appointment has been scheduled for 02/24/2018 @1045  physical. Patient refused to participate in AVW. Please advise on refill and patient having lab work prior to visit.

## 2018-02-24 ENCOUNTER — Encounter: Payer: Self-pay | Admitting: Nurse Practitioner

## 2018-03-21 ENCOUNTER — Ambulatory Visit (INDEPENDENT_AMBULATORY_CARE_PROVIDER_SITE_OTHER): Payer: Medicare HMO | Admitting: Nurse Practitioner

## 2018-03-21 ENCOUNTER — Encounter: Payer: Self-pay | Admitting: Nurse Practitioner

## 2018-03-21 VITALS — BP 140/78 | HR 72 | Temp 97.8°F | Resp 20 | Ht 67.0 in | Wt 194.4 lb

## 2018-03-21 DIAGNOSIS — Z1159 Encounter for screening for other viral diseases: Secondary | ICD-10-CM | POA: Diagnosis not present

## 2018-03-21 DIAGNOSIS — E785 Hyperlipidemia, unspecified: Secondary | ICD-10-CM | POA: Diagnosis not present

## 2018-03-21 DIAGNOSIS — E034 Atrophy of thyroid (acquired): Secondary | ICD-10-CM

## 2018-03-21 DIAGNOSIS — M19042 Primary osteoarthritis, left hand: Secondary | ICD-10-CM

## 2018-03-21 DIAGNOSIS — M19041 Primary osteoarthritis, right hand: Secondary | ICD-10-CM | POA: Diagnosis not present

## 2018-03-21 MED ORDER — LEVOTHYROXINE SODIUM 88 MCG PO TABS: ORAL_TABLET | ORAL | 1 refills | Status: DC

## 2018-03-21 NOTE — Patient Instructions (Addendum)
Sign record release for Dr Nori Riis so we can update mammogram and bone density    DASH Eating Plan DASH stands for "Dietary Approaches to Stop Hypertension." The DASH eating plan is a healthy eating plan that has been shown to reduce high blood pressure (hypertension). It may also reduce your risk for type 2 diabetes, heart disease, and stroke. The DASH eating plan may also help with weight loss. What are tips for following this plan?  General guidelines  Avoid eating more than 2,300 mg (milligrams) of salt (sodium) a day. If you have hypertension, you may need to reduce your sodium intake to 1,500 mg a day.  Limit alcohol intake to no more than 1 drink a day for nonpregnant women and 2 drinks a day for men. One drink equals 12 oz of beer, 5 oz of wine, or 1 oz of hard liquor.  Work with your health care provider to maintain a healthy body weight or to lose weight. Ask what an ideal weight is for you.  Get at least 30 minutes of exercise that causes your heart to beat faster (aerobic exercise) most days of the week. Activities may include walking, swimming, or biking.  Work with your health care provider or diet and nutrition specialist (dietitian) to adjust your eating plan to your individual calorie needs. Reading food labels   Check food labels for the amount of sodium per serving. Choose foods with less than 5 percent of the Daily Value of sodium. Generally, foods with less than 300 mg of sodium per serving fit into this eating plan.  To find whole grains, look for the word "whole" as the first word in the ingredient list. Shopping  Buy products labeled as "low-sodium" or "no salt added."  Buy fresh foods. Avoid canned foods and premade or frozen meals. Cooking  Avoid adding salt when cooking. Use salt-free seasonings or herbs instead of table salt or sea salt. Check with your health care provider or pharmacist before using salt substitutes.  Do not fry foods. Cook foods using  healthy methods such as baking, boiling, grilling, and broiling instead.  Cook with heart-healthy oils, such as olive, canola, soybean, or sunflower oil. Meal planning  Eat a balanced diet that includes: ? 5 or more servings of fruits and vegetables each day. At each meal, try to fill half of your plate with fruits and vegetables. ? Up to 6-8 servings of whole grains each day. ? Less than 6 oz of lean meat, poultry, or fish each day. A 3-oz serving of meat is about the same size as a deck of cards. One egg equals 1 oz. ? 2 servings of low-fat dairy each day. ? A serving of nuts, seeds, or beans 5 times each week. ? Heart-healthy fats. Healthy fats called Omega-3 fatty acids are found in foods such as flaxseeds and coldwater fish, like sardines, salmon, and mackerel.  Limit how much you eat of the following: ? Canned or prepackaged foods. ? Food that is high in trans fat, such as fried foods. ? Food that is high in saturated fat, such as fatty meat. ? Sweets, desserts, sugary drinks, and other foods with added sugar. ? Full-fat dairy products.  Do not salt foods before eating.  Try to eat at least 2 vegetarian meals each week.  Eat more home-cooked food and less restaurant, buffet, and fast food.  When eating at a restaurant, ask that your food be prepared with less salt or no salt, if possible. What  foods are recommended? The items listed Federici not be a complete list. Talk with your dietitian about what dietary choices are best for you. Grains Whole-grain or whole-wheat bread. Whole-grain or whole-wheat pasta. Brown rice. Modena Morrow. Bulgur. Whole-grain and low-sodium cereals. Pita bread. Low-fat, low-sodium crackers. Whole-wheat flour tortillas. Vegetables Fresh or frozen vegetables (raw, steamed, roasted, or grilled). Low-sodium or reduced-sodium tomato and vegetable juice. Low-sodium or reduced-sodium tomato sauce and tomato paste. Low-sodium or reduced-sodium canned  vegetables. Fruits All fresh, dried, or frozen fruit. Canned fruit in natural juice (without added sugar). Meat and other protein foods Skinless chicken or [place]. Ground chicken or [place]. Pork with fat trimmed off. Fish and seafood. Egg whites. Dried beans, peas, or lentils. Unsalted nuts, nut butters, and seeds. Unsalted canned beans. Lean cuts of beef with fat trimmed off. Low-sodium, lean deli meat. Dairy Low-fat (1%) or fat-free (skim) milk. Fat-free, low-fat, or reduced-fat cheeses. Nonfat, low-sodium ricotta or cottage cheese. Low-fat or nonfat yogurt. Low-fat, low-sodium cheese. Fats and oils Soft margarine without trans fats. Vegetable oil. Low-fat, reduced-fat, or light mayonnaise and salad dressings (reduced-sodium). Canola, safflower, olive, soybean, and sunflower oils. Avocado. Seasoning and other foods Herbs. Spices. Seasoning mixes without salt. Unsalted popcorn and pretzels. Fat-free sweets. What foods are not recommended? The items listed Kubin not be a complete list. Talk with your dietitian about what dietary choices are best for you. Grains Baked goods made with fat, such as croissants, muffins, or some breads. Dry pasta or rice meal packs. Vegetables Creamed or fried vegetables. Vegetables in a cheese sauce. Regular canned vegetables (not low-sodium or reduced-sodium). Regular canned tomato sauce and paste (not low-sodium or reduced-sodium). Regular tomato and vegetable juice (not low-sodium or reduced-sodium). Angie Fava. Olives. Fruits Canned fruit in a light or heavy syrup. Fried fruit. Fruit in cream or butter sauce. Meat and other protein foods Fatty cuts of meat. Ribs. Fried meat. Berniece Salines. Sausage. Bologna and other processed lunch meats. Salami. Fatback. Hotdogs. Bratwurst. Salted nuts and seeds. Canned beans with added salt. Canned or smoked fish. Whole eggs or egg yolks. Chicken or [place] with skin. Dairy Whole or 2% milk, cream, and half-and-half. Whole or full-fat  cream cheese. Whole-fat or sweetened yogurt. Full-fat cheese. Nondairy creamers. Whipped toppings. Processed cheese and cheese spreads. Fats and oils Butter. Stick margarine. Lard. Shortening. Ghee. Bacon fat. Tropical oils, such as coconut, palm kernel, or palm oil. Seasoning and other foods Salted popcorn and pretzels. Onion salt, garlic salt, seasoned salt, table salt, and sea salt. Worcestershire sauce. Tartar sauce. Barbecue sauce. Teriyaki sauce. Soy sauce, including reduced-sodium. Steak sauce. Canned and packaged gravies. Fish sauce. Oyster sauce. Cocktail sauce. Horseradish that you find on the shelf. Ketchup. Mustard. Meat flavorings and tenderizers. Bouillon cubes. Hot sauce and Tabasco sauce. Premade or packaged marinades. Premade or packaged taco seasonings. Relishes. Regular salad dressings. Where to find more information:  National Heart, Lung, and [place]: https://wilson-eaton.com/  American Heart Association: www.heart.org Summary  The DASH eating plan is a healthy eating plan that has been shown to reduce high blood pressure (hypertension). It Kitzmiller also reduce your risk for type 2 diabetes, heart disease, and stroke.  With the DASH eating plan, you should limit salt (sodium) intake to 2,300 mg a day. If you have hypertension, you Mccaughey need to reduce your sodium intake to 1,500 mg a day.  When on the DASH eating plan, aim to eat more fresh fruits and vegetables, whole grains, lean proteins, low-fat dairy, and heart-healthy fats.  Work with  your health care provider or diet and nutrition specialist (dietitian) to adjust your eating plan to your individual calorie needs. This information is not intended to replace advice given to you by your health care provider. Make sure you discuss any questions you have with your health care provider. Document Released: 02/25/2011 Document Revised: 03/01/2016 Document Reviewed: 03/01/2016 Elsevier Interactive Patient Education  2019 [employer].

## 2018-03-21 NOTE — Progress Notes (Signed)
Careteam: Patient Care Team: Lauree Chandler, NP as PCP - General (Geriatric Medicine)  Advanced Directive information    No Known Allergies  Chief Complaint  Patient presents with  . Medical Management of Chronic Issues     HPI: Patient is a 72 y.o. female seen in the office today routine follow up.   Pt has not had follow up in over a year.   Hypothyroid- taking synthroid 88 mcg daily - have been out of synthroid for 5 days  Went to Dr Nori Riis - GYN- who does breast exam/mammogram.  Reports she is UTD on bone density.   Declines flu shot- does not "want one" "useless"  Does not want a pneumonia, has been educated on vaccine.   Elevated blood pressure- ate bacon and ham for breakfast, did not sleep well last night and thinks this could contribute.   OA- uses aspercream which helps.   Previous smoker- using ASA daily for heart health   Review of Systems:  Review of Systems  Constitutional: Negative for chills, fever and weight loss.  HENT: Negative for tinnitus.   Respiratory: Negative for cough, sputum production and shortness of breath.   Cardiovascular: Negative for chest pain, palpitations and leg swelling.  Gastrointestinal: Negative for abdominal pain, constipation, diarrhea and heartburn.  Genitourinary: Negative for dysuria, frequency and urgency.  Musculoskeletal: Negative for back pain, falls, joint pain and myalgias.  Skin: Negative.   Neurological: Negative for dizziness and headaches.  Psychiatric/Behavioral: Negative for depression and memory loss. The patient does not have insomnia.     Past Medical History:  Diagnosis Date  . Arthritis   . Cataract 11/29/2016   rt eye   Dr Clent Jacks  . Hx of adenomatous polyp of colon 06/13/2014  . Hypothyroidism   . Thyroid disease   . Tobacco abuse    Past Surgical History:  Procedure Laterality Date  . ORIF FEMUR FRACTURE Right 07/24/2014   Procedure: OPEN REDUCTION INTERNAL FIXATION (ORIF) DISTAL  FEMUR FRACTURE;  Surgeon: Newt Minion, MD;  Location: [facility];  Service: Orthopedics;  Laterality: Right;  . ORIF PERIPROSTHETIC FRACTURE Right 07/02/2014   Procedure: Open Reduction Internal Fixation Femur Fracture, Revision Femur Total Hip Arthroplasty;  Surgeon: Newt Minion, MD;  Location: [employer] [facility];  Service: Orthopedics;  Laterality: Right;  . ROTATOR CUFF REPAIR  09-2013  . TOTAL HIP ARTHROPLASTY Right 06/19/2014   Procedure: TOTAL HIP ARTHROPLASTY;  Surgeon: Newt Minion, MD;  Location: [facility];  Service: Orthopedics;  Laterality: Right;  Marland Kitchen VAGINAL HYSTERECTOMY  2001   complete   Social History:   reports that she has quit smoking. Her smoking use included cigarettes. She has a 15.00 pack-year smoking history. She has never used smokeless tobacco. She reports current alcohol use of about 14.0 standard drinks of alcohol per week. She reports that she does not use drugs.  Family History  Problem Relation Age of Onset  . Diabetes type II Mother   . Hypertension Mother   . Diabetes Mother   . Colon cancer Neg Hx   . Thyroid disease Neg Hx   . Stomach cancer Neg Hx     Medications: Patient's Medications  New Prescriptions   No medications on file  Previous Medications   ASPIRIN EC 81 MG TABLET    Take 81 mg by mouth daily.   CHOLECALCIFEROL (VITAMIN D3) 2000 UNITS TABS    Take 1 tablet by mouth daily.   ESTRADIOL (ESTRACE) 1 MG TABLET  Take 1 mg by mouth daily.   LEVOTHYROXINE (SYNTHROID, LEVOTHROID) 88 MCG TABLET    Take one tablet by mouth on empty stomach 30 minutes before breakfast for thyroid. Needs Appointment before anymore future refill.  Modified Medications   No medications on file  Discontinued Medications   AMOXICILLIN-CLAVULANATE (AUGMENTIN) 875-125 MG TABLET    Take 1 tablet by mouth 2 (two) times daily.   ASCORBIC ACID (VITAMIN C) 1000 MG TABLET    Take 1,000 mg by mouth daily.   MELOXICAM (MOBIC) 7.5 MG TABLET    Take 1 tablet (7.5 mg total) by mouth daily.      Physical Exam:   Vitals:   03/21/18 1522  BP: 140/78  Pulse: 72  Resp: 20  Temp: 97.8 F (36.6 C)  TempSrc: Oral  SpO2: 97%  Weight: 194 lb 6.4 oz (88.2 kg)  Height: 5\' 7"  (1.702 m)   Body mass index is 30.45 kg/m.  Physical Exam Constitutional:      General: She is not in acute distress.    Appearance: She is well-developed. She is not diaphoretic.  HENT:     Head: Normocephalic and atraumatic.     Mouth/Throat:     Pharynx: No oropharyngeal exudate.  Eyes:     Conjunctiva/sclera: Conjunctivae normal.     Pupils: Pupils are equal, round, and reactive to light.  Neck:     Musculoskeletal: Normal range of motion and neck supple.  Cardiovascular:     Rate and Rhythm: Normal rate and regular rhythm.     Heart sounds: Normal heart sounds.  Pulmonary:     Effort: Pulmonary effort is normal.     Breath sounds: Normal breath sounds.  Abdominal:     General: Bowel sounds are normal.     Palpations: Abdomen is soft.  Musculoskeletal:        General: No tenderness.  Skin:    General: Skin is warm and dry.  Neurological:     Mental Status: She is alert and oriented to person, place, and time.     Labs reviewed: Basic Metabolic Panel: No results for input(s): NA, K, CL, CO2, GLUCOSE, BUN, CREATININE, CALCIUM, MG, PHOS, TSH in the last 8760 hours. Liver Function Tests: No results for input(s): AST, ALT, ALKPHOS, BILITOT, PROT, ALBUMIN in the last 8760 hours. No results for input(s): LIPASE, AMYLASE in the last 8760 hours. No results for input(s): AMMONIA in the last 8760 hours. CBC: No results for input(s): WBC, NEUTROABS, HGB, HCT, MCV, PLT in the last 8760 hours. Lipid Panel: No results for input(s): CHOL, HDL, LDLCALC, TRIG, CHOLHDL, LDLDIRECT in the last 8760 hours. TSH: No results for input(s): TSH in the last 8760 hours. A1C: No results found for: HGBA1C   Assessment/Plan 1.  Hyperlipidemia, unspecified hyperlipidemia type LDL mildly elevated on  last lab, will follow up today.  - COMPLETE METABOLIC PANEL WITH GFR - Lipid panel  2. Need for hepatitis C screening test - Hepatitis C antibody  3. Hypothyroidism due to acquired atrophy of thyroid -has been out of medication for 5 days. Refill provided - levothyroxine (SYNTHROID, LEVOTHROID) 88 MCG tablet; Take one tablet by mouth on empty stomach 30 minutes before breakfast for thyroid. Needs Appointment before anymore future refill.  Dispense: 90 tablet; Refill: 1 - TSH  4. Primary osteoarthritis of both hands -good results with aspercream at this time. - COMPLETE METABOLIC PANEL WITH GFR - CBC with Differential/Platelets   Next appt: needing AWV. To schedule when labs result.  Carlos American. [place], [facility] [phone]

## 2018-03-23 LAB — LIPID PANEL
Cholesterol: 220 mg/dL — ABNORMAL HIGH (ref <200)
HDL: 60 mg/dL (ref >50)
LDL Cholesterol (Calc): 127 mg/dL (calc) — ABNORMAL HIGH
Non-HDL Cholesterol (Calc): 160 mg/dL (calc) — ABNORMAL HIGH (ref <130)
Total CHOL/HDL Ratio: 3.7 (calc) (ref <5.0)
Triglycerides: 190 mg/dL — ABNORMAL HIGH (ref <150)

## 2018-03-23 LAB — COMPLETE METABOLIC PANEL WITH GFR
AG Ratio: 1.5 (calc) (ref 1.0–2.5)
ALT: 16 U/L (ref 6–29)
AST: 12 U/L (ref 10–35)
Albumin: 4.1 g/dL (ref 3.6–5.1)
Alkaline phosphatase (APISO): 56 U/L (ref 33–130)
BUN: 18 mg/dL (ref 7–25)
CO2: 28 mmol/L (ref 20–32)
Calcium: 9.8 mg/dL (ref 8.6–10.4)
Chloride: 104 mmol/L (ref 98–110)
Creat: 0.79 mg/dL (ref 0.60–0.93)
GFR, Est African American: 88 mL/min/{1.73_m2} (ref >=60)
GFR, Est Non African American: 76 mL/min/{1.73_m2} (ref >=60)
Globulin: 2.8 g/dL (calc) (ref 1.9–3.7)
Glucose, Bld: 85 mg/dL (ref 65–139)
POTASSIUM: 4.4 mmol/L (ref 3.5–5.3)
Sodium: 140 mmol/L (ref 135–146)
Total Bilirubin: 0.3 mg/dL (ref 0.2–1.2)
Total Protein: 6.9 g/dL (ref 6.1–8.1)

## 2018-03-23 LAB — HEPATITIS C ANTIBODY
Hepatitis C Ab: NONREACTIVE
SIGNAL TO CUT-OFF: 0.01 (ref <1.00)

## 2018-03-23 LAB — CBC WITH DIFFERENTIAL/PLATELET
Absolute Monocytes: 988 cells/uL — ABNORMAL HIGH (ref 200–950)
Basophils Absolute: 71 cells/uL (ref 0–200)
Basophils Relative: 0.8 %
Eosinophils Absolute: 196 cells/uL (ref 15–500)
Eosinophils Relative: 2.2 %
HEMATOCRIT: 39.7 % (ref 35.0–45.0)
Hemoglobin: 13.6 g/dL (ref 11.7–15.5)
LYMPHS ABS: 2510 {cells}/uL (ref 850–3900)
MCH: 32.3 pg (ref 27.0–33.0)
MCHC: 34.3 g/dL (ref 32.0–36.0)
MCV: 94.3 fL (ref 80.0–100.0)
MPV: 10.9 fL (ref 7.5–12.5)
Monocytes Relative: 11.1 %
Neutro Abs: 5135 cells/uL (ref 1500–7800)
Neutrophils Relative %: 57.7 %
Platelets: 275 10*3/uL (ref 140–400)
RBC: 4.21 10*6/uL (ref 3.80–5.10)
RDW: 11.8 % (ref 11.0–15.0)
Total Lymphocyte: 28.2 %
WBC: 8.9 10*3/uL (ref 3.8–10.8)

## 2018-03-23 LAB — TSH: TSH: 3.14 mIU/L (ref 0.40–4.50)

## 2018-07-16 ENCOUNTER — Encounter: Payer: Self-pay | Admitting: Nurse Practitioner

## 2018-09-13 ENCOUNTER — Other Ambulatory Visit: Payer: Self-pay | Admitting: Nurse Practitioner

## 2018-09-13 DIAGNOSIS — E034 Atrophy of thyroid (acquired): Secondary | ICD-10-CM

## 2018-09-13 NOTE — Telephone Encounter (Signed)
No pending appointment. Please advise when patient should be seen

## 2018-09-13 NOTE — Telephone Encounter (Signed)
She will need a follow up next month for her 6 month follow up. Also due for AWV which we can do over the phone via tele-visit at this time if she is agreeable.

## 2018-09-13 NOTE — Telephone Encounter (Signed)
Called patient and scheduled a 6 month follow-up. Patient has previously indicated that she does not wish to participate in annual wellness visit (refer to phone note dated 05/31/17).

## 2018-10-11 ENCOUNTER — Encounter: Payer: Self-pay | Admitting: Nurse Practitioner

## 2018-10-11 ENCOUNTER — Ambulatory Visit (INDEPENDENT_AMBULATORY_CARE_PROVIDER_SITE_OTHER): Payer: Medicare HMO | Admitting: Nurse Practitioner

## 2018-10-11 ENCOUNTER — Ambulatory Visit: Payer: Medicare HMO | Admitting: Nurse Practitioner

## 2018-10-11 ENCOUNTER — Other Ambulatory Visit: Payer: Self-pay

## 2018-10-11 VITALS — BP 118/64 | HR 63 | Temp 98.0°F | Ht 67.0 in | Wt 193.0 lb

## 2018-10-11 DIAGNOSIS — M19042 Primary osteoarthritis, left hand: Secondary | ICD-10-CM

## 2018-10-11 DIAGNOSIS — Z0000 Encounter for general adult medical examination without abnormal findings: Secondary | ICD-10-CM

## 2018-10-11 DIAGNOSIS — E034 Atrophy of thyroid (acquired): Secondary | ICD-10-CM | POA: Diagnosis not present

## 2018-10-11 DIAGNOSIS — E785 Hyperlipidemia, unspecified: Secondary | ICD-10-CM | POA: Diagnosis not present

## 2018-10-11 DIAGNOSIS — M19041 Primary osteoarthritis, right hand: Secondary | ICD-10-CM | POA: Diagnosis not present

## 2018-10-11 DIAGNOSIS — Z122 Encounter for screening for malignant neoplasm of respiratory organs: Secondary | ICD-10-CM

## 2018-10-11 NOTE — Patient Instructions (Signed)
Keep up the good work with diet and exercises!

## 2018-10-11 NOTE — Progress Notes (Signed)
   This service is provided via telemedicine  No vital signs collected/recorded due to the encounter was a telemedicine visit.   Location of patient (ex: home, work): Home  Patient consents to a telephone visit: Yes  Location of the provider (ex: office, home):  [facility], Office   Name of any referring provider: N/A  Names of all persons participating in the telemedicine service and their role in the encounter:  S.Chrae B/CMA, Jessica Eubanks, NP, and Patient   Time spent on call:  4 min with medical assistant   

## 2018-10-11 NOTE — Patient Instructions (Signed)
[name] , Thank you for taking time to come for your Medicare Wellness Visit. I appreciate your ongoing commitment to your health goals. Please review the following plan we discussed and let me know if I can assist you in the future.   Screening recommendations/referrals: Colonoscopy due 05/2019 Mammogram- up to date through GYN Bone Density- up to date through GYN Recommended yearly ophthalmology/optometry visit for glaucoma screening and checkup Recommended yearly dental visit for hygiene and checkup  Vaccinations: Influenza vaccine - declines Pneumococcal vaccine -declines Tdap vaccine -declines  Shingles vaccine -declines    Advanced directives: on file.   Conditions/risks identified: none.   Next appointment: 1 year  Ordered CT screening for the lungs due to hx of smoking, call insurance to verify out of pocket, if you decided you do not want this imaging please notify us.    Preventive Care 71 Years and Older, Female Preventive care refers to lifestyle choices and visits with your health care provider that can promote health and wellness. What does preventive care include?  A yearly physical exam. This is also called an annual well check.  Dental exams once or twice a year.  Routine eye exams. Ask your health care provider how often you should have your eyes checked.  Personal lifestyle choices, including:  Daily care of your teeth and gums.  Regular physical activity.  Eating a healthy diet.  Avoiding tobacco and drug use.  Limiting alcohol use.  Practicing safe sex.  Taking low-dose aspirin every day.  Taking vitamin and mineral supplements as recommended by your health care provider. What happens during an annual well check? The services and screenings done by your health care provider during your annual well check will depend on your age, overall health, lifestyle risk factors, and family history of disease. Counseling  Your health care provider may  ask you questions about your:  Alcohol use.  Tobacco use.  Drug use.  Emotional well-being.  Home and relationship well-being.  Sexual activity.  Eating habits.  History of falls.  Memory and ability to understand (cognition).  Work and work Statistician.  Reproductive health. Screening  You may have the following tests or measurements:  Height, weight, and BMI.  Blood pressure.  Lipid and cholesterol levels. These may be checked every 5 years, or more frequently if you are over 87 years old.  Skin check.  Lung cancer screening. You may have this screening every year starting at age 19 if you have a 30-pack-year history of smoking and currently smoke or have quit within the past 15 years.  Fecal occult blood test (FOBT) of the stool. You may have this test every year starting at age 71.  Flexible sigmoidoscopy or colonoscopy. You may have a sigmoidoscopy every 5 years or a colonoscopy every 10 years starting at age 24.  Hepatitis C blood test.  Hepatitis B blood test.  Sexually transmitted disease (STD) testing.  Diabetes screening. This is done by checking your blood sugar (glucose) after you have not eaten for a while (fasting). You may have this done every 1-3 years.  Bone density scan. This is done to screen for osteoporosis. You may have this done starting at age 71.  Mammogram. This may be done every 1-2 years. Talk to your health care provider about how often you should have regular mammograms. Talk with your health care provider about your test results, treatment options, and if necessary, the need for more tests. Vaccines  Your health care provider may recommend  certain vaccines, such as:  Influenza vaccine. This is recommended every year.  Tetanus, diphtheria, and acellular pertussis (Tdap, Td) vaccine. You may need a Td booster every 10 years.  Zoster vaccine. You may need this after age 44.  Pneumococcal 13-valent conjugate (PCV13) vaccine. One  dose is recommended after age 61.  Pneumococcal polysaccharide (PPSV23) vaccine. One dose is recommended after age 66. Talk to your health care provider about which screenings and vaccines you need and how often you need them. This information is not intended to replace advice given to you by your health care provider. Make sure you discuss any questions you have with your health care provider. Document Released: 04/04/2015 Document Revised: 11/26/2015 Document Reviewed: 01/07/2015 Elsevier Interactive Patient Education  2017 [place] Prevention in the Home Falls can cause injuries. They can happen to people of all ages. There are many things you can do to make your home safe and to help prevent falls. What can I do on the outside of my home?  Regularly fix the edges of walkways and driveways and fix any cracks.  Remove anything that might make you trip as you walk through a door, such as a raised step or threshold.  Trim any bushes or trees on the path to your home.  Use bright outdoor lighting.  Clear any walking paths of anything that might make someone trip, such as rocks or tools.  Regularly check to see if handrails are loose or broken. Make sure that both sides of any steps have handrails.  Any raised decks and porches should have guardrails on the edges.  Have any leaves, snow, or ice cleared regularly.  Use sand or salt on walking paths during winter.  Clean up any spills in your garage right away. This includes oil or grease spills. What can I do in the bathroom?  Use night lights.  Install grab bars by the toilet and in the tub and shower. Do not use towel bars as grab bars.  Use non-skid mats or decals in the tub or shower.  If you need to sit down in the shower, use a plastic, non-slip stool.  Keep the floor dry. Clean up any water that spills on the floor as soon as it happens.  Remove soap buildup in the tub or shower regularly.  Attach bath  mats securely with double-sided non-slip rug tape.  Do not have throw rugs and other things on the floor that can make you trip. What can I do in the bedroom?  Use night lights.  Make sure that you have a light by your bed that is easy to reach.  Do not use any sheets or blankets that are too big for your bed. They should not hang down onto the floor.  Have a firm chair that has side arms. You can use this for support while you get dressed.  Do not have throw rugs and other things on the floor that can make you trip. What can I do in the kitchen?  Clean up any spills right away.  Avoid walking on wet floors.  Keep items that you use a lot in easy-to-reach places.  If you need to reach something above you, use a strong step stool that has a grab bar.  Keep electrical cords out of the way.  Do not use floor polish or wax that makes floors slippery. If you must use wax, use non-skid floor wax.  Do not have throw rugs and other  things on the floor that can make you trip. What can I do with my stairs?  Do not leave any items on the stairs.  Make sure that there are handrails on both sides of the stairs and use them. Fix handrails that are broken or loose. Make sure that handrails are as long as the stairways.  Check any carpeting to make sure that it is firmly attached to the stairs. Fix any carpet that is loose or worn.  Avoid having throw rugs at the top or bottom of the stairs. If you do have throw rugs, attach them to the floor with carpet tape.  Make sure that you have a light switch at the top of the stairs and the bottom of the stairs. If you do not have them, ask someone to add them for you. What else can I do to help prevent falls?  Wear shoes that:  Do not have high heels.  Have rubber bottoms.  Are comfortable and fit you well.  Are closed at the toe. Do not wear sandals.  If you use a stepladder:  Make sure that it is fully opened. Do not climb a closed  stepladder.  Make sure that both sides of the stepladder are locked into place.  Ask someone to hold it for you, if possible.  Clearly mark and make sure that you can see:  Any grab bars or handrails.  First and last steps.  Where the edge of each step is.  Use tools that help you move around (mobility aids) if they are needed. These include:  Canes.  Walkers.  Scooters.  Crutches.  Turn on the lights when you go into a dark area. Replace any light bulbs as soon as they burn out.  Set up your furniture so you have a clear path. Avoid moving your furniture around.  If any of your floors are uneven, fix them.  If there are any pets around you, be aware of where they are.  Review your medicines with your doctor. Some medicines can make you feel dizzy. This can increase your chance of falling. Ask your doctor what other things that you can do to help prevent falls. This information is not intended to replace advice given to you by your health care provider. Make sure you discuss any questions you have with your health care provider. Document Released: 01/02/2009 Document Revised: 08/14/2015 Document Reviewed: 04/12/2014 Elsevier Interactive Patient Education  2017 [employer].

## 2018-10-11 NOTE — Progress Notes (Signed)
Careteam: Patient Care Team: Lauree Chandler, NP as PCP - General (Geriatric Medicine)  Advanced Directive information Does Patient Have a Medical Advance Directive?: Yes, Type of Advance Directive: [place], Does patient want to make changes to medical advance directive?: No - Patient declined  No Known Allergies  Chief Complaint  Patient presents with  . Medical Management of Chronic Issues    6 month follow-up   . Quality Metric Gaps    Discuss need for DEXA and Mammogram      HPI: Patient is a 73 y.o. female seen in the office today for 6 month follow up.   Reports she gets mammogram yearly- last was 2019 by Dr Nori Riis  Dr Nori Riis also does bone density who also does pelvic.   Hyperlipidemia- attempting to eat healthier, eating baked food mostly, rarely fried. Cutting back on bread   Has lost a lb, plans to lose 6-8 lbs more  Exercising at home.  Review of Systems:  Review of Systems  Constitutional: Negative for chills, fever and weight loss.  HENT: Negative for tinnitus.   Respiratory: Negative for cough, sputum production and shortness of breath.   Cardiovascular: Negative for chest pain, palpitations and leg swelling.  Gastrointestinal: Negative for abdominal pain, constipation, diarrhea and heartburn.  Genitourinary: Negative for dysuria, frequency and urgency.  Musculoskeletal: Positive for joint pain. Negative for back pain, falls and myalgias.  Skin: Negative.   Neurological: Negative for dizziness and headaches.  Psychiatric/Behavioral: Negative for depression and memory loss. The patient does not have insomnia.     Past Medical History:  Diagnosis Date  . Arthritis   . Cataract 11/29/2016   rt eye   Dr Clent Jacks  . Hx of adenomatous polyp of colon 06/13/2014  . Hypothyroidism   . Thyroid disease   . Tobacco abuse    Past Surgical History:  Procedure Laterality Date  . ORIF FEMUR FRACTURE Right 07/24/2014   Procedure: OPEN  REDUCTION INTERNAL FIXATION (ORIF) DISTAL FEMUR FRACTURE;  Surgeon: Newt Minion, MD;  Location: [facility];  Service: Orthopedics;  Laterality: Right;  . ORIF PERIPROSTHETIC FRACTURE Right 07/02/2014   Procedure: Open Reduction Internal Fixation Femur Fracture, Revision Femur Total Hip Arthroplasty;  Surgeon: Newt Minion, MD;  Location: [facility];  Service: Orthopedics;  Laterality: Right;  . ROTATOR CUFF REPAIR  09-2013  . TOTAL HIP ARTHROPLASTY Right 06/19/2014   Procedure: TOTAL HIP ARTHROPLASTY;  Surgeon: Newt Minion, MD;  Location: [facility];  Service: Orthopedics;  Laterality: Right;  Marland Kitchen VAGINAL HYSTERECTOMY  2001   complete   Social History:   reports that she quit smoking about 5 years ago. Her smoking use included cigarettes. She has a 15.00 pack-year smoking history. She has never used smokeless tobacco. She reports current alcohol use of about 14.0 standard drinks of alcohol per week. She reports that she does not use drugs.  Family History  Problem Relation Age of Onset  . Diabetes type II Mother   . Hypertension Mother   . Diabetes Mother   . Colon cancer Neg Hx   . Thyroid disease Neg Hx   . Stomach cancer Neg Hx     Medications: Patient's Medications  New Prescriptions   No medications on file  Previous Medications   CHOLECALCIFEROL (VITAMIN D3) 2000 UNITS TABS    Take 1 tablet by mouth daily.   CYANOCOBALAMIN (VITAMIN B12 PO)    Take 1 tablet by mouth daily.   ESTRADIOL (  ESTRACE) 1 MG TABLET    Take 1 mg by mouth daily.   LEVOTHYROXINE (SYNTHROID) 88 MCG TABLET    TAKE ONE TABLET BY MOUTH ON EMPTY STOMACH 30 MINUTES BEFORE BREAKFAST FOR THYROID. NEEDS APPOINTMENT BEFORE ANYMORE FUTURE REFILL.  Modified Medications   No medications on file  Discontinued Medications   ASPIRIN EC 81 MG TABLET    Take 81 mg by mouth daily.    Physical Exam:  Vitals:   10/11/18 0942  BP: 118/64  Pulse: 63  Temp: 98 F (36.7 C)  TempSrc: Oral  SpO2: 98%  Weight: 193 lb (87.5 kg)   Height: 5\' 7"  (1.702 m)   Body mass index is 30.23 kg/m. Wt Readings from Last 3 Encounters:  10/11/18 193 lb (87.5 kg)  03/21/18 194 lb 6.4 oz (88.2 kg)  05/04/17 189 lb (85.7 kg)    Physical Exam Constitutional:      General: She is not in acute distress.    Appearance: She is well-developed. She is not diaphoretic.  HENT:     Head: Normocephalic and atraumatic.     Mouth/Throat:     Pharynx: No oropharyngeal exudate.  Eyes:     Conjunctiva/sclera: Conjunctivae normal.     Pupils: Pupils are equal, round, and reactive to light.  Neck:     Musculoskeletal: Normal range of motion and neck supple.  Cardiovascular:     Rate and Rhythm: Normal rate and regular rhythm.     Heart sounds: Normal heart sounds.  Pulmonary:     Effort: Pulmonary effort is normal.     Breath sounds: Normal breath sounds.  Abdominal:     General: Bowel sounds are normal.     Palpations: Abdomen is soft.  Musculoskeletal:        General: No tenderness.  Skin:    General: Skin is warm and dry.  Neurological:     Mental Status: She is alert and oriented to person, place, and time.     Labs reviewed: Basic Metabolic Panel: Recent Labs    03/21/18 1558  NA 140  K 4.4  CL 104  CO2 28  GLUCOSE 85  BUN 18  CREATININE 0.79  CALCIUM 9.8  TSH 3.14   Liver Function Tests: Recent Labs    03/21/18 1558  AST 12  ALT 16  BILITOT 0.3  PROT 6.9   No results for input(s): LIPASE, AMYLASE in the last 8760 hours. No results for input(s): AMMONIA in the last 8760 hours. CBC: Recent Labs    03/21/18 1558  WBC 8.9  NEUTROABS 5,135  HGB 13.6  HCT 39.7  MCV 94.3  PLT 275   Lipid Panel: Recent Labs    03/21/18 1558  CHOL 220*  HDL 60  LDLCALC 127*  TRIG 190*  CHOLHDL 3.7   TSH: Recent Labs    03/21/18 1558  TSH 3.14   A1C: No results found for: HGBA1C   Assessment/Plan 1. Hypothyroidism due to acquired atrophy of thyroid Continues on synthroid daily - TSH  2. Primary  osteoarthritis of both hands Stable, uses aleve occasionally when needed - COMPLETE METABOLIC PANEL WITH GFR  3. Hyperlipidemia, unspecified hyperlipidemia type -has made dietary changes and increasing activity  - Lipid Panel - COMPLETE METABOLIC PANEL WITH GFR  Next appt: AWV and 1 year follow up [place]. [place], [facility] [phone]

## 2018-10-11 NOTE — Progress Notes (Signed)
Subjective:   Bianca Collins is a 73 y.o. female who presents for Medicare Annual (Subsequent) preventive examination.  Review of Systems:         Objective:     Vitals: There were no vitals taken for this visit.  There is no height or weight on file to calculate BMI.  Advanced Directives 10/11/2018 10/11/2018 12/07/2016 11/28/2015 05/28/2015 04/18/2015 04/03/2015  Does Patient Have a Medical Advance Directive? Yes Yes Yes No Yes No No  Type of Industrial/product designer of [employer] Power of Attorney - Living will - -  Does patient want to make changes to medical advance directive? No - Patient declined No - Patient declined No - Patient declined - No - Patient declined - -  Copy of [place] in Chart? Yes - validated most recent copy scanned in chart (See row information) Yes - validated most recent copy scanned in chart (See row information) Yes - No - copy requested - -  Would patient like information on creating a medical advance directive? - - - No - patient declined information - Yes - Educational materials given -    Tobacco Social History   Tobacco Use  Smoking Status Former Smoker  . Packs/day: 0.50  . Years: 30.00  . Pack years: 15.00  . Types: Cigarettes  . Quit date: 11/19/2012  . Years since quitting: 5.8  Smokeless Tobacco Never Used  Tobacco Comment   Patient reports that she is gradually trying to quit     Counseling given: Not Answered Comment: Patient reports that she is gradually trying to quit   Clinical Intake:                       Past Medical History:  Diagnosis Date  . Arthritis   . Cataract 11/29/2016   rt eye   Dr Clent Jacks  . Hx of adenomatous polyp of colon 06/13/2014  . Hypothyroidism   . Thyroid disease   . Tobacco abuse    Past Surgical History:  Procedure Laterality Date  . ORIF FEMUR FRACTURE Right 07/24/2014   Procedure: OPEN REDUCTION INTERNAL FIXATION  (ORIF) DISTAL FEMUR FRACTURE;  Surgeon: Newt Minion, MD;  Location: [facility];  Service: Orthopedics;  Laterality: Right;  . ORIF PERIPROSTHETIC FRACTURE Right 07/02/2014   Procedure: Open Reduction Internal Fixation Femur Fracture, Revision Femur Total Hip Arthroplasty;  Surgeon: Newt Minion, MD;  Location: [facility];  Service: Orthopedics;  Laterality: Right;  . ROTATOR CUFF REPAIR  09-2013  . TOTAL HIP ARTHROPLASTY Right 06/19/2014   Procedure: TOTAL HIP ARTHROPLASTY;  Surgeon: Newt Minion, MD;  Location: [facility];  Service: Orthopedics;  Laterality: Right;  Marland Kitchen VAGINAL HYSTERECTOMY  2001   complete   Family History  Problem Relation Age of Onset  . Diabetes type II Mother   . Hypertension Mother   . Diabetes Mother   . Colon cancer Neg Hx   . Thyroid disease Neg Hx   . Stomach cancer Neg Hx    Social History   Socioeconomic History  . Marital status: Divorced    Spouse name: Not on file  . Number of children: Not on file  . Years of education: Not on file  . Highest education level: Not on file  Occupational History  . Not on file  Social Needs  . Financial resource strain: Not on file  . Food insecurity    Worry: Not  on file    Inability: Not on file  . Transportation needs    Medical: Not on file    Non-medical: Not on file  Tobacco Use  . Smoking status: Former Smoker    Packs/day: 0.50    Years: 30.00    Pack years: 15.00    Types: Cigarettes    Quit date: 11/19/2012    Years since quitting: 5.8  . Smokeless tobacco: Never Used  . Tobacco comment: Patient reports that she is gradually trying to quit  Substance and Sexual Activity  . Alcohol use: Yes    Alcohol/week: 14.0 standard drinks    Types: 14 Glasses of wine per week  . Drug use: No  . Sexual activity: Not on file  Lifestyle  . Physical activity    Days per week: Not on file    Minutes per session: Not on file  . Stress: Not on file  Relationships  . Social Herbalist on phone: Not on file     Gets together: Not on file    Attends religious service: Not on file    Active member of club or organization: Not on file    Attends meetings of clubs or organizations: Not on file    Relationship status: Not on file  Other Topics Concern  . Not on file  Social History Narrative   Diet?       Do you drink/eat things with caffeine? Tries not to       Marital status?   divorced                                 What year were you married? 2001      Do you live in a house, apartment, assisted living, condo, trailer, etc.? house      Is it one or more stories? no      How many persons live in your home? 4      Do you have any pets in your home? (please list) 3 cats      Current or past profession: Scientist, water quality, worked for [employer], customer service      Do you exercise?    Outpatient Encounter Medications as of 10/11/2018  Medication Sig  . Cholecalciferol (VITAMIN D3) 2000 units TABS Take 1 tablet by mouth daily.  . Cyanocobalamin (VITAMIN B12 PO) Take 1 tablet by mouth daily.  Marland Kitchen estradiol (ESTRACE) 1 MG tablet Take 1 mg by mouth daily.  Marland Kitchen levothyroxine (SYNTHROID) 88 MCG tablet TAKE ONE TABLET BY MOUTH ON EMPTY STOMACH 30 MINUTES BEFORE BREAKFAST FOR THYROID. NEEDS APPOINTMENT BEFORE ANYMORE FUTURE REFILL.   No facility-administered encounter medications on file as of 10/11/2018.     Activities of Daily Living No flowsheet data found.  Patient Care Team: Lauree Chandler, NP as PCP - General (Geriatric Medicine)    Assessment:   This is a routine wellness examination for [name].  Exercise Activities and Dietary recommendations    Goals   None     Fall Risk Fall Risk  10/11/2018 10/11/2018 05/04/2017 02/23/2017 11/28/2015  Falls in the past year? 0 0 No No No  Number falls in past yr: 0 0 - - -  Injury with Fall? 0 0 - - -   Is the patient's home free of loose throw rugs in walkways, pet beds, electrical cords, etc?   yes  Grab bars in the bathroom? yes       Handrails on the stairs?   yes      Adequate lighting?   yes  Timed Get Up and Go performed: na  Depression Screen PHQ 2/9 Scores 10/11/2018 10/11/2018 02/23/2017 05/28/2015  PHQ - 2 Score 0 0 0 0     Cognitive Function MMSE - Mini Mental State Exam 12/07/2016 05/28/2015 05/28/2015  Not completed: - (No Data) (No Data)  Orientation to time 5 4 4   Orientation to Place 5 5 5   Registration 3 3 3   Attention/ Calculation 5 5 5   Recall 3 3 3   Language- name 2 objects 2 2 2   Language- repeat 1 1 1   Language- follow 3 step command 3 3 3   Language- read & follow direction 1 1 1   Write a sentence 1 1 1   Copy design 0 1 1  Total score 29 29 29      6CIT Screen 10/11/2018  What Year? 0 points  What month? 0 points  What time? 0 points  Count back from 20 0 points  Months in reverse 0 points  Repeat phrase 0 points  Total Score 0    There is no immunization history for the selected administration types on file for this patient.  Qualifies for Shingles Vaccine? Pt declines all vaccines   Screening Tests Health Maintenance  Topic Date Due  . DEXA SCAN  02/09/2013  . MAMMOGRAM  08/31/2018  . PNA vac Low Risk Adult (1 of 2 - PCV13) 03/22/2019 (Originally 02/09/2013)  . TETANUS/TDAP  12/08/2026 (Originally 02/10/1965)  . INFLUENZA VACCINE  10/21/2018  . COLONOSCOPY  06/07/2019  . Hepatitis C Screening  Completed    Cancer Screenings: Lung: Low Dose CT Chest recommended if Age 30-80 years, 30 pack-year currently smoking OR have quit w/in 15years. Patient does qualify.  Breast:  Up to date on Mammogram? Yes   Up to date of Bone Density/Dexa? Yes Colorectal: due to 05/2019  Additional Screenings: Hepatitis C Screening: done/negative.     Plan:      I have personally reviewed and noted the following in the patient's chart:   . Medical and social history . Use of alcohol, tobacco or illicit drugs  . Current medications and supplements . Functional ability and status . Nutritional  status . Physical activity . Advanced directives . List of other physicians . Hospitalizations, surgeries, and ER visits in previous 12 months . Vitals . Screenings to include cognitive, depression, and falls . Referrals and appointments  In addition, I have reviewed and discussed with patient certain preventive protocols, quality metrics, and best practice recommendations. A written personalized care plan for preventive services as well as general preventive health recommendations were provided to patient.     Lauree Chandler, NP  10/11/2018

## 2018-10-12 LAB — COMPLETE METABOLIC PANEL WITH GFR
AG Ratio: 1.6 (calc) (ref 1.0–2.5)
ALT: 13 U/L (ref 6–29)
AST: 12 U/L (ref 10–35)
Albumin: 4 g/dL (ref 3.6–5.1)
Alkaline phosphatase (APISO): 46 U/L (ref 37–153)
BUN: 13 mg/dL (ref 7–25)
CO2: 27 mmol/L (ref 20–32)
Calcium: 9.3 mg/dL (ref 8.6–10.4)
Chloride: 103 mmol/L (ref 98–110)
Creat: 0.67 mg/dL (ref 0.60–0.93)
GFR, Est African American: 103 mL/min/{1.73_m2} (ref >=60)
GFR, Est Non African American: 89 mL/min/{1.73_m2} (ref >=60)
Globulin: 2.5 g/dL (calc) (ref 1.9–3.7)
Glucose, Bld: 107 mg/dL — ABNORMAL HIGH (ref 65–99)
Potassium: 5.1 mmol/L (ref 3.5–5.3)
Sodium: 139 mmol/L (ref 135–146)
Total Bilirubin: 0.3 mg/dL (ref 0.2–1.2)
Total Protein: 6.5 g/dL (ref 6.1–8.1)

## 2018-10-12 LAB — LIPID PANEL
Cholesterol: 192 mg/dL (ref <200)
HDL: 66 mg/dL (ref >=50)
LDL Cholesterol (Calc): 107 mg/dL (calc) — ABNORMAL HIGH
Non-HDL Cholesterol (Calc): 126 mg/dL (calc) (ref <130)
Total CHOL/HDL Ratio: 2.9 (calc) (ref <5.0)
Triglycerides: 91 mg/dL (ref <150)

## 2018-10-12 LAB — TSH: TSH: 0.81 mIU/L (ref 0.40–4.50)

## 2018-12-10 ENCOUNTER — Other Ambulatory Visit: Payer: Self-pay | Admitting: Nurse Practitioner

## 2018-12-10 DIAGNOSIS — E034 Atrophy of thyroid (acquired): Secondary | ICD-10-CM

## 2019-01-01 ENCOUNTER — Encounter: Payer: Self-pay | Admitting: Nurse Practitioner

## 2019-01-01 ENCOUNTER — Other Ambulatory Visit: Payer: Self-pay

## 2019-01-01 ENCOUNTER — Ambulatory Visit (INDEPENDENT_AMBULATORY_CARE_PROVIDER_SITE_OTHER): Payer: Medicare HMO | Admitting: Nurse Practitioner

## 2019-01-01 VITALS — BP 116/74 | HR 65 | Temp 98.2°F | Ht 67.0 in | Wt 196.6 lb

## 2019-01-01 DIAGNOSIS — H6123 Impacted cerumen, bilateral: Secondary | ICD-10-CM | POA: Diagnosis not present

## 2019-01-01 NOTE — Progress Notes (Signed)
Careteam: Patient Care Team: Lauree Chandler, NP as PCP - General (Geriatric Medicine)  Advanced Directive information    No Known Allergies  Chief Complaint  Patient presents with  . Acute Visit    Ear Congestion   . Quality Metric Gaps    Discuss need for DEXA and Mammogram   . Immunizations    Refused a vaccine recommendations      HPI: Patient is a 73 y.o. female seen in the office today due to feeling ear congestion on right. States she feels like she is hearing through a barrel, not necessarily having hearing loss.  No pain No congestion to nose or nasal discharge.  Has been using peroxide which normal helps the issue but has not worked this time.   Has bone density and mammogram scheduled   Review of Systems:  Review of Systems  Constitutional: Negative for chills and fever.  HENT: Positive for congestion. Negative for ear discharge, ear pain and hearing loss.   Skin: Negative for itching and rash.    Past Medical History:  Diagnosis Date  . Arthritis   . Cataract 11/29/2016   rt eye   Dr Clent Jacks  . Hx of adenomatous polyp of colon 06/13/2014  . Hypothyroidism   . Thyroid disease   . Tobacco abuse    Past Surgical History:  Procedure Laterality Date  . ORIF FEMUR FRACTURE Right 07/24/2014   Procedure: OPEN REDUCTION INTERNAL FIXATION (ORIF) DISTAL FEMUR FRACTURE;  Surgeon: Newt Minion, MD;  Location: [facility];  Service: Orthopedics;  Laterality: Right;  . ORIF PERIPROSTHETIC FRACTURE Right 07/02/2014   Procedure: Open Reduction Internal Fixation Femur Fracture, Revision Femur Total Hip Arthroplasty;  Surgeon: Newt Minion, MD;  Location: [facility];  Service: Orthopedics;  Laterality: Right;  . ROTATOR CUFF REPAIR  09-2013  . TOTAL HIP ARTHROPLASTY Right 06/19/2014   Procedure: TOTAL HIP ARTHROPLASTY;  Surgeon: Newt Minion, MD;  Location: [facility];  Service: Orthopedics;  Laterality: Right;  Marland Kitchen VAGINAL HYSTERECTOMY  2001   complete   Social History:  reports that she quit smoking about 3 years ago. Her smoking use included cigarettes. She has a 15.00 pack-year smoking history. She has never used smokeless tobacco. She reports current alcohol use of about 14.0 standard drinks of alcohol per week. She reports that she does not use drugs.  Family History  Problem Relation Age of Onset  . Diabetes type II Mother   . Hypertension Mother   . Diabetes Mother   . Colon cancer Neg Hx   . Thyroid disease Neg Hx   . Stomach cancer Neg Hx     Medications: Patient's Medications  New Prescriptions   No medications on file  Previous Medications   CHOLECALCIFEROL (VITAMIN D3) 2000 UNITS TABS    Take 1 tablet by mouth daily.   CYANOCOBALAMIN (VITAMIN B12 PO)    Take 1 tablet by mouth daily.   ESTRADIOL (ESTRACE) 1 MG TABLET    Take 1 mg by mouth daily.   LEVOTHYROXINE (SYNTHROID) 88 MCG TABLET    Take one tablet by mouth on empty stomach 30 minutes before breakfast for thyroid.   MULTIPLE VITAMIN (MULTIVITAMIN) TABLET    Take 1 tablet by mouth daily.  Modified Medications   No medications on file  Discontinued Medications   No medications on file    Physical Exam:  Vitals:   01/01/19 1110  BP: 116/74  Pulse: 65  Temp: 98.2 F (36.8 C)  TempSrc: Temporal  SpO2: 97%  Weight: 196 lb 9.6 oz (89.2 kg)  Height: 5\' 7"  (1.702 m)   Body mass index is 30.79 kg/m. Wt Readings from Last 3 Encounters:  01/01/19 196 lb 9.6 oz (89.2 kg)  10/11/18 193 lb (87.5 kg)  03/21/18 194 lb 6.4 oz (88.2 kg)    Physical Exam Constitutional:      Appearance: Normal appearance.  HENT:     Head: Normocephalic and atraumatic.     Right Ear: There is impacted cerumen.     Left Ear: There is impacted cerumen.  Neurological:     Mental Status: She is alert and oriented to person, place, and time.  Psychiatric:        Mood and Affect: Mood normal.     Labs reviewed: Basic Metabolic Panel: Recent Labs    03/21/18 1558 10/11/18 1016  NA 140 139   K 4.4 5.1  CL 104 103  CO2 28 27  GLUCOSE 85 107*  BUN 18 13  CREATININE 0.79 0.67  CALCIUM 9.8 9.3  TSH 3.14 0.81   Liver Function Tests: Recent Labs    03/21/18 1558 10/11/18 1016  AST 12 12  ALT 16 13  BILITOT 0.3 0.3  PROT 6.9 6.5   No results for input(s): LIPASE, AMYLASE in the last 8760 hours. No results for input(s): AMMONIA in the last 8760 hours. CBC: Recent Labs    03/21/18 1558  WBC 8.9  NEUTROABS 5,135  HGB 13.6  HCT 39.7  MCV 94.3  PLT 275   Lipid Panel: Recent Labs    03/21/18 1558 10/11/18 1016  CHOL 220* 192  HDL 60 66  LDLCALC 127* 107*  TRIG 190* 91  CHOLHDL 3.7 2.9   TSH: Recent Labs    03/21/18 1558 10/11/18 1016  TSH 3.14 0.81   A1C: No results found for: HGBA1C   Assessment/Plan 1. Bilateral impacted cerumen -ear lavage done bilaterally with large amount of cerumen removed. Pt tolerated well and had immediate results/relief. Reports she was hearing better already   Next appt: as scheduled, sooner if needed Skii Cleland K. [place], [facility] [phone]

## 2019-01-12 ENCOUNTER — Ambulatory Visit
Admission: RE | Admit: 2019-01-12 | Discharge: 2019-01-12 | Disposition: A | Discharge status: Home / Self Care | Payer: Medicare HMO | Source: Ambulatory Visit | Attending: Nurse Practitioner | Admitting: Nurse Practitioner

## 2019-01-12 ENCOUNTER — Encounter: Payer: Self-pay | Admitting: Nurse Practitioner

## 2019-01-12 DIAGNOSIS — Z87891 Personal history of nicotine dependence: Secondary | ICD-10-CM | POA: Diagnosis not present

## 2019-01-12 DIAGNOSIS — I700 Atherosclerosis of aorta: Secondary | ICD-10-CM | POA: Insufficient documentation

## 2019-01-12 DIAGNOSIS — Z122 Encounter for screening for malignant neoplasm of respiratory organs: Secondary | ICD-10-CM

## 2019-01-29 DIAGNOSIS — Z1231 Encounter for screening mammogram for malignant neoplasm of breast: Secondary | ICD-10-CM | POA: Diagnosis not present

## 2019-01-29 DIAGNOSIS — Z6832 Body mass index (BMI) 32.0-32.9, adult: Secondary | ICD-10-CM | POA: Diagnosis not present

## 2019-01-29 DIAGNOSIS — Z124 Encounter for screening for malignant neoplasm of cervix: Secondary | ICD-10-CM | POA: Diagnosis not present

## 2019-01-29 LAB — HM MAMMOGRAPHY

## 2019-02-19 ENCOUNTER — Ambulatory Visit (INDEPENDENT_AMBULATORY_CARE_PROVIDER_SITE_OTHER): Payer: Medicare HMO | Admitting: Orthopedic Surgery

## 2019-02-19 ENCOUNTER — Ambulatory Visit (INDEPENDENT_AMBULATORY_CARE_PROVIDER_SITE_OTHER): Payer: Medicare HMO

## 2019-02-19 ENCOUNTER — Other Ambulatory Visit: Payer: Self-pay

## 2019-02-19 ENCOUNTER — Encounter: Payer: Self-pay | Admitting: Orthopedic Surgery

## 2019-02-19 DIAGNOSIS — G8929 Other chronic pain: Secondary | ICD-10-CM

## 2019-02-19 DIAGNOSIS — M25562 Pain in left knee: Secondary | ICD-10-CM

## 2019-02-19 MED ORDER — METHYLPREDNISOLONE ACETATE 40 MG/ML IJ SUSP
40.0000 mg | INTRAMUSCULAR | Status: AC | PRN
  Administered 2019-02-19: 40 mg via INTRA_ARTICULAR

## 2019-02-19 MED ORDER — LIDOCAINE HCL 1 % IJ SOLN
1.0000 mL | INTRAMUSCULAR | Status: AC | PRN
  Administered 2019-02-19: 1 mL

## 2019-02-19 NOTE — Progress Notes (Signed)
Office Visit Note   Patient: Bianca Collins           Date of Birth: Oct 17, 1945           MRN: [id number] Visit Date: 02/19/2019              Requested by: Lauree Chandler, NP [place],  [place] [address] PCP: Lauree Chandler, NP  Chief Complaint  Patient presents with  . Left Knee - Pain      HPI: Pleasant 73 y/o with history of left knee arthritis. Last had an injection in 2018 . Requesting another injection. Most of pain is over patellofemoral joint.   Assessment & Plan: Visit Diagnoses:  1. Chronic pain of left knee     Plan: Discussed quad strengthening and low impact exercise. Will go forward with an injection today.  Follow-Up Instructions: No follow-ups on file.   Ortho Exam  Patient is alert, oriented, no adenopathy, well-dressed, normal affect, normal respiratory effort. Left knee: No effusion. Distal CMS is intact. Crepitus in PF area with ROM .   Imaging: No results found. No images are attached to the encounter.  Labs: No results found for: HGBA1C, ESRSEDRATE, CRP, LABURIC, REPTSTATUS, GRAMSTAIN, CULT, LABORGA   Lab Results  Component Value Date   ALBUMIN 4.0 05/28/2015   ALBUMIN 4.0 06/10/2014    No results found for: MG No results found for: VD25OH  No results found for: PREALBUMIN CBC EXTENDED Latest Ref Rng & Units 03/21/2018 05/28/2015 07/24/2014  WBC 3.8 - 10.8 Thousand/uL 8.9 9.0 9.0  RBC 3.80 - 5.10 Million/uL 4.21 4.10 4.00  HGB 11.7 - 15.5 g/dL 13.6 13.3 12.2  HCT 35.0 - 45.0 % 39.7 39.8 38.4  PLT 140 - 400 Thousand/uL 275 329 355  NEUTROABS 1,500 - 7,800 cells/uL 5,135 5.4 -  LYMPHSABS 850 - 3,900 cells/uL 2,510 2.6 -     There is no height or weight on file to calculate BMI.  Orders:  Orders Placed This Encounter  Procedures  . XR KNEE 3 VIEW LEFT   No orders of the defined types were placed in this encounter.    Procedures: Large Joint Inj on 02/19/2019 9:14 AM Indications: pain and diagnostic  evaluation Details: 22 G 1.5 in needle, anteromedial approach  Arthrogram: No  Medications: 1 mL lidocaine 1 %; 40 mg methylPREDNISolone acetate 40 MG/ML Outcome: tolerated well, no immediate complications Procedure, treatment alternatives, risks and benefits explained, specific risks discussed. Consent was given by the patient. Immediately prior to procedure a time out was called to verify the correct patient, procedure, equipment, support staff and site/side marked as required. Patient was prepped and draped in the usual sterile fashion.      Clinical Data: No additional findings.  ROS:  All other systems negative, except as noted in the HPI. Review of Systems  Objective: Vital Signs: There were no vitals taken for this visit.  Specialty Comments:  No specialty comments available.  PMFS History: Patient Active Problem List   Diagnosis Date Noted  . Aortic atherosclerosis ([place]) 01/12/2019  . Femur fracture, right ([facility]) 07/25/2014  . Closed femur fracture ([facility]) 07/24/2014  . Periprosthetic fracture around internal prosthetic right hip joint ([facility]) 07/05/2014  . Periprosth fracture around internal prosth r hip jt, sequela 07/02/2014  . H/O total hip arthroplasty 06/19/2014  . Hx of adenomatous polyp of colon 06/13/2014  . Hypothyroidism 06/06/2014   Past Medical History:  Diagnosis Date  . Arthritis   .  Cataract 11/29/2016   rt eye   Dr Clent Jacks  . Hx of adenomatous polyp of colon 06/13/2014  . Hypothyroidism   . Thyroid disease   . Tobacco abuse     Family History  Problem Relation Age of Onset  . Diabetes type II Mother   . Hypertension Mother   . Diabetes Mother   . Colon cancer Neg Hx   . Thyroid disease Neg Hx   . Stomach cancer Neg Hx     Past Surgical History:  Procedure Laterality Date  . ORIF FEMUR FRACTURE Right 07/24/2014   Procedure: OPEN REDUCTION INTERNAL FIXATION (ORIF) DISTAL FEMUR FRACTURE;  Surgeon: Newt Minion, MD;  Location: [facility];   Service: Orthopedics;  Laterality: Right;  . ORIF PERIPROSTHETIC FRACTURE Right 07/02/2014   Procedure: Open Reduction Internal Fixation Femur Fracture, Revision Femur Total Hip Arthroplasty;  Surgeon: Newt Minion, MD;  Location: [facility];  Service: Orthopedics;  Laterality: Right;  . ROTATOR CUFF REPAIR  09-2013  . TOTAL HIP ARTHROPLASTY Right 06/19/2014   Procedure: TOTAL HIP ARTHROPLASTY;  Surgeon: Newt Minion, MD;  Location: [facility];  Service: Orthopedics;  Laterality: Right;  Marland Kitchen VAGINAL HYSTERECTOMY  2001   complete   Social History   Occupational History  . Not on file  Tobacco Use  . Smoking status: Former Smoker    Packs/day: 0.50    Years: 30.00    Pack years: 15.00    Types: Cigarettes    Quit date: 11/20/2015    Years since quitting: 3.2  . Smokeless tobacco: Never Used  . Tobacco comment: Patient reports that she is gradually trying to quit  Substance and Sexual Activity  . Alcohol use: Yes    Alcohol/week: 14.0 standard drinks    Types: 14 Glasses of wine per week  . Drug use: No  . Sexual activity: Not on file

## 2019-05-08 ENCOUNTER — Other Ambulatory Visit: Payer: Self-pay | Admitting: Nurse Practitioner

## 2019-05-08 DIAGNOSIS — E034 Atrophy of thyroid (acquired): Secondary | ICD-10-CM

## 2019-05-24 ENCOUNTER — Other Ambulatory Visit: Payer: Self-pay

## 2019-05-24 ENCOUNTER — Encounter: Payer: Self-pay | Admitting: Family

## 2019-05-24 ENCOUNTER — Ambulatory Visit (INDEPENDENT_AMBULATORY_CARE_PROVIDER_SITE_OTHER): Payer: Medicare HMO | Admitting: Family

## 2019-05-24 VITALS — BP 120/80 | HR 64 | Temp 97.9°F | Ht 67.0 in | Wt 196.0 lb

## 2019-05-24 DIAGNOSIS — H6122 Impacted cerumen, left ear: Secondary | ICD-10-CM

## 2019-05-24 DIAGNOSIS — H65192 Other acute nonsuppurative otitis media, left ear: Secondary | ICD-10-CM | POA: Diagnosis not present

## 2019-05-24 MED ORDER — AMOXICILLIN-POT CLAVULANATE 875-125 MG PO TABS: 1.0000 | ORAL_TABLET | Freq: Two times a day (BID) | ORAL | 0 refills | Status: DC

## 2019-05-24 MED ORDER — SACCHAROMYCES BOULARDII 250 MG PO CAPS: 250.0000 mg | ORAL_CAPSULE | Freq: Two times a day (BID) | ORAL | 0 refills | Status: AC

## 2019-05-24 NOTE — Patient Instructions (Signed)
Take Augmentin 825 /125 mg tablet one by mouth twice daily x 10 days along with probiotics Florastor 250 mg capsule one by mouth twice daily x 10 days  - Notify provider if symptoms worsen or fail to resolve.

## 2019-05-24 NOTE — Progress Notes (Addendum)
Provider: Dinah Ngetich FNP-C  Lauree Chandler, NP  Patient Care Team: Lauree Chandler, NP as PCP - General (Geriatric Medicine)  Extended Emergency Contact Information Primary Emergency Contact: [facility] Address: [address]          [place], [place] [address] [place] Phone: [phone] Work Phone: [phone] Mobile Phone: [phone] Relation: Daughter  Code Status:  Full Code  Goals of care: Advanced Directive information Advanced Directives 10/11/2018  Does Patient Have a Medical Advance Directive? Yes  Type of Advance Directive [place]  Does patient want to make changes to medical advance directive? No - Patient declined  Copy of [place] in Chart? Yes - validated most recent copy scanned in chart (See row information)  Would patient like information on creating a medical advance directive? -     Chief Complaint  Patient presents with  . Acute Visit    ear pain    HPI:  Pt is a 74 y.o. female seen today for an acute visit for evaluation of left ear pain x 2 days.she states has had on and off pain for several days but got worst yesterday.Pain radiates down to left side of throat and head too.she denies any loss of hearing ,ringing or drainage from ear.also denies any fever,chills or upper respiratory infections.she applies warm peroxide to both ears every 2 weeks for cerumen removal.states left ear tends to build up faster. She is due for Pneumococcal vaccine but states does not take any vaccine.she would like to wait on taking COVID-19 vaccine " I'm still waiting to see the side effects.  States had her Dexa scan and mammogram one year ago at [facility] follows up by Dr.Neal.    Past Medical History:  Diagnosis Date  . Arthritis   . Cataract 11/29/2016   rt eye   Dr Clent Jacks  . Hx of adenomatous polyp of colon 06/13/2014  . Hypothyroidism   . Thyroid disease   . Tobacco abuse     Past Surgical History:  Procedure Laterality Date  . ORIF FEMUR FRACTURE Right 07/24/2014   Procedure: OPEN REDUCTION INTERNAL FIXATION (ORIF) DISTAL FEMUR FRACTURE;  Surgeon: Newt Minion, MD;  Location: [facility];  Service: Orthopedics;  Laterality: Right;  . ORIF PERIPROSTHETIC FRACTURE Right 07/02/2014   Procedure: Open Reduction Internal Fixation Femur Fracture, Revision Femur Total Hip Arthroplasty;  Surgeon: Newt Minion, MD;  Location: [facility];  Service: Orthopedics;  Laterality: Right;  . ROTATOR CUFF REPAIR  09-2013  . TOTAL HIP ARTHROPLASTY Right 06/19/2014   Procedure: TOTAL HIP ARTHROPLASTY;  Surgeon: Newt Minion, MD;  Location: [facility];  Service: Orthopedics;  Laterality: Right;  Marland Kitchen VAGINAL HYSTERECTOMY  2001   complete    No Known Allergies  Outpatient Encounter Medications as of 05/24/2019  Medication Sig  . Cholecalciferol (VITAMIN D3) 2000 units TABS Take 1 tablet by mouth daily.  . Cyanocobalamin (VITAMIN B12 PO) Take 1 tablet by mouth daily.  Marland Kitchen estradiol (ESTRACE) 1 MG tablet Take 1 mg by mouth daily.  Marland Kitchen levothyroxine (SYNTHROID) 88 MCG tablet TAKE ONE TABLET BY MOUTH ON EMPTY STOMACH 30 MINUTES BEFORE BREAKFAST FOR THYROID.  . Multiple Vitamin (MULTIVITAMIN) tablet Take 1 tablet by mouth daily.   No facility-administered encounter medications on file as of 05/24/2019.    Review of Systems  Constitutional: Negative for appetite change, chills, fatigue and fever.  HENT: Positive for ear pain. Negative for congestion, dental problem,  ear discharge, facial swelling, hearing loss, postnasal drip, rhinorrhea, sinus pressure, sinus pain, sneezing, tinnitus and trouble swallowing.        Left throat tender   Respiratory: Negative for cough, chest tightness, shortness of breath and wheezing.   Cardiovascular: Negative for chest pain, palpitations and leg swelling.  Neurological: Negative for dizziness, speech difficulty and headaches.    There is no immunization history for the  selected administration types on file for this patient. Pertinent  Health Maintenance Due  Topic Date Due  . DEXA SCAN  02/09/2013  . PNA vac Low Risk Adult (1 of 2 - PCV13) 02/09/2013  . MAMMOGRAM  08/31/2018  . COLONOSCOPY  06/07/2019  . INFLUENZA VACCINE  Discontinued   Fall Risk  05/24/2019 01/01/2019 10/11/2018 10/11/2018 05/04/2017  Falls in the past year? 0 0 0 0 No  Number falls in past yr: 0 0 0 0 -  Injury with Fall? 0 0 0 0 -    Vitals:   05/24/19 0935  BP: 120/80  Pulse: 64  Temp: 97.9 F (36.6 C)  TempSrc: Oral  SpO2: 95%  Weight: 196 lb (88.9 kg)  Height: 5\' 7"  (1.702 m)   Body mass index is 30.7 kg/m. Physical Exam Vitals reviewed.  Constitutional:      General: She is not in acute distress.    Appearance: She is obese. She is not ill-appearing.  HENT:     Head: Normocephalic.     Right Ear: Tympanic membrane, ear canal and external ear normal. There is no impacted cerumen.     Left Ear: No drainage. Tympanic membrane is erythematous and bulging. Tympanic membrane is not retracted.     Ears:     Comments: Left ear moderate cerumen impaction ear lavaged with warm water and peroxide.tolerated procedure well.TM erythema and bulging noted.  Neck:     Vascular: No carotid bruit.  Cardiovascular:     Rate and Rhythm: Normal rate and regular rhythm.     Pulses: Normal pulses.     Heart sounds: Normal heart sounds. No murmur. No friction rub. No gallop.   Pulmonary:     Effort: Pulmonary effort is normal. No respiratory distress.     Breath sounds: Normal breath sounds. No wheezing, rhonchi or rales.  Chest:     Chest wall: No tenderness.  Abdominal:     General: Bowel sounds are normal. There is no distension.     Palpations: Abdomen is soft. There is no mass.     Tenderness: There is no abdominal tenderness. There is no right CVA tenderness, left CVA tenderness, guarding or rebound.  Musculoskeletal:     Cervical back: Normal range of motion. No rigidity or  tenderness.  Lymphadenopathy:     Cervical: No cervical adenopathy.  Skin:    General: Skin is warm and dry.     Coloration: Skin is not pale.     Findings: No bruising, erythema or rash.  Neurological:     Mental Status: She is alert and oriented to person, place, and time.     Cranial Nerves: No cranial nerve deficit.     Sensory: No sensory deficit.     Motor: No weakness.     Gait: Gait normal.  Psychiatric:        Mood and Affect: Mood normal.        Behavior: Behavior normal.        Thought Content: Thought content normal.        Judgment:  Judgment normal.    Labs reviewed: Recent Labs    10/11/18 1016  NA 139  K 5.1  CL 103  CO2 27  GLUCOSE 107*  BUN 13  CREATININE 0.67  CALCIUM 9.3   Recent Labs    10/11/18 1016  AST 12  ALT 13  BILITOT 0.3  PROT 6.5   No results for input(s): WBC, NEUTROABS, HGB, HCT, MCV, PLT in the last 8760 hours. Lab Results  Component Value Date   TSH 0.81 10/11/2018   No results found for: HGBA1C Lab Results  Component Value Date   CHOL 192 10/11/2018   HDL 66 10/11/2018   LDLCALC 107 (H) 10/11/2018   TRIG 91 10/11/2018   CHOLHDL 2.9 10/11/2018    Significant Diagnostic Results in last 30 days:  No results found.  Assessment/Plan  1. Other non-recurrent acute nonsuppurative otitis media of left ear Afebrile.Left ear cerumen impaction lavaged as above.TM erythematous and bugling.will start on Augmentin.side effects discussed verbalized understanding.Advised to complete course of antibiotics even when pain resolve. - amoxicillin-clavulanate (AUGMENTIN) 875-125 MG tablet; Take 1 tablet by mouth 2 (two) times daily.  Dispense: 20 tablet; Refill: 0 - saccharomyces boulardii (FLORASTOR) 250 MG capsule; Take 1 capsule (250 mg total) by mouth 2 (two) times daily for 10 days.  Dispense: 20 capsule; Refill: 0  2. Impacted cerumen of left ear Ear lavaged Moderate amount of cerumen obtained.Patient's tolerated procedure well.TM  erythema and bulging as above.Augmentin ordered.   Family/ staff Communication: Reviewed plan of care with patient verbalized understanding.   Labs/tests ordered: None   Next Appointment: as needed if symptoms worsen or fail to resolve.    Sandrea Hughs, NP

## 2019-05-28 ENCOUNTER — Encounter: Payer: Self-pay | Admitting: Nurse Practitioner

## 2019-05-28 DIAGNOSIS — H04123 Dry eye syndrome of bilateral lacrimal glands: Secondary | ICD-10-CM | POA: Diagnosis not present

## 2019-05-28 DIAGNOSIS — H02834 Dermatochalasis of left upper eyelid: Secondary | ICD-10-CM | POA: Diagnosis not present

## 2019-05-28 DIAGNOSIS — Z961 Presence of intraocular lens: Secondary | ICD-10-CM | POA: Diagnosis not present

## 2019-05-28 DIAGNOSIS — B0239 Other herpes zoster eye disease: Secondary | ICD-10-CM | POA: Diagnosis not present

## 2019-05-28 DIAGNOSIS — H02831 Dermatochalasis of right upper eyelid: Secondary | ICD-10-CM | POA: Diagnosis not present

## 2019-06-11 DIAGNOSIS — B0232 Zoster iridocyclitis: Secondary | ICD-10-CM | POA: Diagnosis not present

## 2019-06-11 DIAGNOSIS — B0239 Other herpes zoster eye disease: Secondary | ICD-10-CM | POA: Diagnosis not present

## 2019-06-11 DIAGNOSIS — Z961 Presence of intraocular lens: Secondary | ICD-10-CM | POA: Diagnosis not present

## 2019-06-11 DIAGNOSIS — H04123 Dry eye syndrome of bilateral lacrimal glands: Secondary | ICD-10-CM | POA: Diagnosis not present

## 2019-06-11 DIAGNOSIS — H02834 Dermatochalasis of left upper eyelid: Secondary | ICD-10-CM | POA: Diagnosis not present

## 2019-06-11 DIAGNOSIS — H02831 Dermatochalasis of right upper eyelid: Secondary | ICD-10-CM | POA: Diagnosis not present

## 2019-06-19 DIAGNOSIS — B0232 Zoster iridocyclitis: Secondary | ICD-10-CM | POA: Diagnosis not present

## 2019-06-19 DIAGNOSIS — B0239 Other herpes zoster eye disease: Secondary | ICD-10-CM | POA: Diagnosis not present

## 2019-08-06 DIAGNOSIS — B0239 Other herpes zoster eye disease: Secondary | ICD-10-CM | POA: Diagnosis not present

## 2019-08-06 DIAGNOSIS — B0232 Zoster iridocyclitis: Secondary | ICD-10-CM | POA: Diagnosis not present

## 2019-08-21 ENCOUNTER — Encounter: Payer: Self-pay | Admitting: Orthopedic Surgery

## 2019-08-21 ENCOUNTER — Ambulatory Visit (INDEPENDENT_AMBULATORY_CARE_PROVIDER_SITE_OTHER): Payer: Medicare HMO | Admitting: Orthopedic Surgery

## 2019-08-21 ENCOUNTER — Other Ambulatory Visit: Payer: Self-pay

## 2019-08-21 VITALS — Ht 67.0 in | Wt 196.0 lb

## 2019-08-21 DIAGNOSIS — M1712 Unilateral primary osteoarthritis, left knee: Secondary | ICD-10-CM

## 2019-08-22 ENCOUNTER — Encounter: Payer: Self-pay | Admitting: Orthopedic Surgery

## 2019-08-22 DIAGNOSIS — M1712 Unilateral primary osteoarthritis, left knee: Secondary | ICD-10-CM

## 2019-08-22 MED ORDER — LIDOCAINE HCL 1 % IJ SOLN
5.0000 mL | INTRAMUSCULAR | Status: AC | PRN
  Administered 2019-08-22: 5 mL

## 2019-08-22 MED ORDER — METHYLPREDNISOLONE ACETATE 40 MG/ML IJ SUSP
40.0000 mg | INTRAMUSCULAR | Status: AC | PRN
  Administered 2019-08-22: 40 mg via INTRA_ARTICULAR

## 2019-08-22 NOTE — Progress Notes (Signed)
Office Visit Note   Patient: Bianca Collins           Date of Birth: 1945-08-30           MRN: [id number] Visit Date: 08/21/2019              Requested by: Lauree Chandler, NP [place],  [place] [address] PCP: Lauree Chandler, NP  Chief Complaint  Patient presents with  . Left Knee - Pain, Follow-up      HPI: Patient is a 74 year old woman who presents today complaining of some chronic left knee pain.  She is had a history of osteoarthritis of left knee has great interval relief with Depo-Medrol injections.  Her last injection was November 30 she has had relief until about 2 weeks ago.  Unfortunately she did have a fall where she landed directly on the knee and has had worsening of her symptoms since.  Complaining of 6 out of 10 pain.  Complaining of some swelling today this waxes and wanes.  She is having pain with walking as well as some aching pain when trying to lay flat in bed.  She has intermittent locking and catching no falling.  She is interested in considering total knee arthroplasty in the future.  Discussed this with Dr. Sharol Given today.  Assessment & Plan: Visit Diagnoses:  1. Primary osteoarthritis of left knee     Plan: She will continue with conservative measures hold on total knee arthroplasty at this time Depo-Medrol today patient tolerated the injection well.  Follow-Up Instructions: Return if symptoms worsen or fail to improve.   Right Knee Exam   Muscle Strength  The patient has normal right knee strength.  Tenderness  The patient is experiencing tenderness in the medial joint line.  Range of Motion  The patient has normal right knee ROM.  Tests  Varus: negative Valgus: negative  Other  Erythema: absent Swelling: mild Effusion: no effusion present      Patient is alert, oriented, no adenopathy, well-dressed, normal affect, normal respiratory effort.   Imaging: No results found. No images are attached to the  encounter.  Labs: No results found for: HGBA1C, ESRSEDRATE, CRP, LABURIC, REPTSTATUS, GRAMSTAIN, CULT, LABORGA   Lab Results  Component Value Date   ALBUMIN 4.0 05/28/2015   ALBUMIN 4.0 06/10/2014    No results found for: MG No results found for: VD25OH  No results found for: PREALBUMIN CBC EXTENDED Latest Ref Rng & Units 03/21/2018 05/28/2015 07/24/2014  WBC 3.8 - 10.8 Thousand/uL 8.9 9.0 9.0  RBC 3.80 - 5.10 Million/uL 4.21 4.10 4.00  HGB 11.7 - 15.5 g/dL 13.6 13.3 12.2  HCT 35.0 - 45.0 % 39.7 39.8 38.4  PLT 140 - 400 Thousand/uL 275 329 355  NEUTROABS 1,500 - 7,800 cells/uL 5,135 5.4 -  LYMPHSABS 850 - 3,900 cells/uL 2,510 2.6 -     Body mass index is 30.7 kg/m.  Orders:  No orders of the defined types were placed in this encounter.  No orders of the defined types were placed in this encounter.    Procedures: Large Joint Inj: L knee on 08/22/2019 9:01 AM Indications: pain and diagnostic evaluation Details: 22 G 1.5 in needle  Arthrogram: No  Medications: 40 mg methylPREDNISolone acetate 40 MG/ML; 5 mL lidocaine 1 % Outcome: tolerated well, no immediate complications Procedure, treatment alternatives, risks and benefits explained, specific risks discussed. Consent was given by the patient. Immediately prior to procedure a time out was called to  verify the correct patient, procedure, equipment, support staff and site/side marked as required. Patient was prepped and draped in the usual sterile fashion.      Clinical Data: No additional findings.  ROS:  All other systems negative, except as noted in the HPI. Review of Systems  Constitutional: Negative for chills and fever.  Musculoskeletal: Positive for arthralgias and joint swelling.    Objective: Vital Signs: Ht 5\' 7"  (1.702 m)   Wt 196 lb (88.9 kg)   BMI 30.70 kg/m   Specialty Comments:  No specialty comments available.  PMFS History: Patient Active Problem List   Diagnosis Date Noted  . Aortic  atherosclerosis ([place]) 01/12/2019  . Femur fracture, right ([place]) 07/25/2014  . Closed femur fracture ([place]) 07/24/2014  . Periprosthetic fracture around internal prosthetic right hip joint ([place]) 07/05/2014  . Periprosth fracture around internal prosth r hip jt, sequela 07/02/2014  . H/O total hip arthroplasty 06/19/2014  . Hx of adenomatous polyp of colon 06/13/2014  . Hypothyroidism 06/06/2014   Past Medical History:  Diagnosis Date  . Arthritis   . Cataract 11/29/2016   rt eye   Dr Clent Jacks  . Hx of adenomatous polyp of colon 06/13/2014  . Hypothyroidism   . Thyroid disease   . Tobacco abuse     Family History  Problem Relation Age of Onset  . Diabetes type II Mother   . Hypertension Mother   . Diabetes Mother   . Colon cancer Neg Hx   . Thyroid disease Neg Hx   . Stomach cancer Neg Hx     Past Surgical History:  Procedure Laterality Date  . ORIF FEMUR FRACTURE Right 07/24/2014   Procedure: OPEN REDUCTION INTERNAL FIXATION (ORIF) DISTAL FEMUR FRACTURE;  Surgeon: Newt Minion, MD;  Location: [facility];  Service: Orthopedics;  Laterality: Right;  . ORIF PERIPROSTHETIC FRACTURE Right 07/02/2014   Procedure: Open Reduction Internal Fixation Femur Fracture, Revision Femur Total Hip Arthroplasty;  Surgeon: Newt Minion, MD;  Location: [facility];  Service: Orthopedics;  Laterality: Right;  . ROTATOR CUFF REPAIR  09-2013  . TOTAL HIP ARTHROPLASTY Right 06/19/2014   Procedure: TOTAL HIP ARTHROPLASTY;  Surgeon: Newt Minion, MD;  Location: [facility];  Service: Orthopedics;  Laterality: Right;  Marland Kitchen VAGINAL HYSTERECTOMY  2001   complete   Social History   Occupational History  . Not on file  Tobacco Use  . Smoking status: Former Smoker    Packs/day: 0.50    Years: 30.00    Pack years: 15.00    Types: Cigarettes    Quit date: 11/20/2015    Years since quitting: 3.7  . Smokeless tobacco: Never Used  . Tobacco comment: Patient reports that she is gradually trying to quit  Substance and  Sexual Activity  . Alcohol use: Yes    Alcohol/week: 14.0 standard drinks    Types: 14 Glasses of wine per week  . Drug use: No  . Sexual activity: Not on file

## 2019-09-04 ENCOUNTER — Ambulatory Visit: Payer: Medicare HMO | Admitting: Family

## 2019-10-12 ENCOUNTER — Ambulatory Visit: Payer: Self-pay | Admitting: Nurse Practitioner

## 2019-10-12 ENCOUNTER — Other Ambulatory Visit: Payer: Self-pay

## 2019-10-12 ENCOUNTER — Ambulatory Visit (INDEPENDENT_AMBULATORY_CARE_PROVIDER_SITE_OTHER): Payer: Medicare HMO | Admitting: Nurse Practitioner

## 2019-10-12 ENCOUNTER — Encounter: Payer: Self-pay | Admitting: Nurse Practitioner

## 2019-10-12 VITALS — BP 128/72 | HR 64 | Temp 95.1°F | Ht 66.75 in | Wt 195.1 lb

## 2019-10-12 DIAGNOSIS — H6123 Impacted cerumen, bilateral: Secondary | ICD-10-CM

## 2019-10-12 DIAGNOSIS — E034 Atrophy of thyroid (acquired): Secondary | ICD-10-CM | POA: Diagnosis not present

## 2019-10-12 DIAGNOSIS — I700 Atherosclerosis of aorta: Secondary | ICD-10-CM | POA: Diagnosis not present

## 2019-10-12 DIAGNOSIS — E785 Hyperlipidemia, unspecified: Secondary | ICD-10-CM

## 2019-10-12 DIAGNOSIS — M1712 Unilateral primary osteoarthritis, left knee: Secondary | ICD-10-CM | POA: Diagnosis not present

## 2019-10-12 LAB — TSH: TSH: 1.39 mIU/L (ref 0.40–4.50)

## 2019-10-12 LAB — COMPLETE METABOLIC PANEL WITH GFR
AG Ratio: 1.6 (calc) (ref 1.0–2.5)
ALT: 12 U/L (ref 6–29)
AST: 13 U/L (ref 10–35)
Albumin: 4 g/dL (ref 3.6–5.1)
Alkaline phosphatase (APISO): 53 U/L (ref 37–153)
BUN: 11 mg/dL (ref 7–25)
CO2: 28 mmol/L (ref 20–32)
Calcium: 9.2 mg/dL (ref 8.6–10.4)
Chloride: 104 mmol/L (ref 98–110)
Creat: 0.7 mg/dL (ref 0.60–0.93)
GFR, Est African American: 101 mL/min/{1.73_m2} (ref >=60)
GFR, Est Non African American: 87 mL/min/{1.73_m2} (ref >=60)
Globulin: 2.5 g/dL (calc) (ref 1.9–3.7)
Glucose, Bld: 103 mg/dL — ABNORMAL HIGH (ref 65–99)
Potassium: 4.3 mmol/L (ref 3.5–5.3)
Sodium: 138 mmol/L (ref 135–146)
Total Bilirubin: 0.4 mg/dL (ref 0.2–1.2)
Total Protein: 6.5 g/dL (ref 6.1–8.1)

## 2019-10-12 LAB — LIPID PANEL
Cholesterol: 210 mg/dL — ABNORMAL HIGH (ref <200)
HDL: 74 mg/dL (ref >=50)
LDL Cholesterol (Calc): 116 mg/dL (calc) — ABNORMAL HIGH
Non-HDL Cholesterol (Calc): 136 mg/dL (calc) — ABNORMAL HIGH (ref <130)
Total CHOL/HDL Ratio: 2.8 (calc) (ref <5.0)
Triglycerides: 95 mg/dL (ref <150)

## 2019-10-12 LAB — CBC WITH DIFFERENTIAL/PLATELET
Absolute Monocytes: 766 cells/uL (ref 200–950)
Basophils Absolute: 68 cells/uL (ref 0–200)
Basophils Relative: 0.7 %
Eosinophils Absolute: 204 cells/uL (ref 15–500)
Eosinophils Relative: 2.1 %
HCT: 41.2 % (ref 35.0–45.0)
Hemoglobin: 13.8 g/dL (ref 11.7–15.5)
Lymphs Abs: 2018 cells/uL (ref 850–3900)
MCH: 32 pg (ref 27.0–33.0)
MCHC: 33.5 g/dL (ref 32.0–36.0)
MCV: 95.6 fL (ref 80.0–100.0)
MPV: 10.9 fL (ref 7.5–12.5)
Monocytes Relative: 7.9 %
Neutro Abs: 6645 cells/uL (ref 1500–7800)
Neutrophils Relative %: 68.5 %
Platelets: 275 10*3/uL (ref 140–400)
RBC: 4.31 10*6/uL (ref 3.80–5.10)
RDW: 12 % (ref 11.0–15.0)
Total Lymphocyte: 20.8 %
WBC: 9.7 10*3/uL (ref 3.8–10.8)

## 2019-10-12 NOTE — Patient Instructions (Signed)
Health Maintenance, Female Adopting a healthy lifestyle and getting preventive care are important in promoting health and wellness. Ask your health care provider about:  The right schedule for you to have regular tests and exams.  Things you can do on your own to prevent diseases and keep yourself healthy. What should I know about diet, weight, and exercise? Eat a healthy diet   Eat a diet that includes plenty of vegetables, fruits, low-fat dairy products, and lean protein.  Do not eat a lot of foods that are high in solid fats, added sugars, or sodium. Maintain a healthy weight Body mass index (BMI) is used to identify weight problems. It estimates body fat based on height and weight. Your health care provider can help determine your BMI and help you achieve or maintain a healthy weight. Get regular exercise Get regular exercise. This is one of the most important things you can do for your health. Most adults should:  Exercise for at least 150 minutes each week. The exercise should increase your heart rate and make you sweat (moderate-intensity exercise).  Do strengthening exercises at least twice a week. This is in addition to the moderate-intensity exercise.  Spend less time sitting. Even light physical activity can be beneficial. Watch cholesterol and blood lipids Have your blood tested for lipids and cholesterol at 72 years of age, then have this test every 5 years. Have your cholesterol levels checked more often if:  Your lipid or cholesterol levels are high.  You are older than 72 years of age.  You are at high risk for heart disease. What should I know about cancer screening? Depending on your health history and family history, you may need to have cancer screening at various ages. This may include screening for:  Breast cancer.  Cervical cancer.  Colorectal cancer.  Skin cancer.  Lung cancer. What should I know about heart disease, diabetes, and high blood  pressure? Blood pressure and heart disease  High blood pressure causes heart disease and increases the risk of stroke. This is more likely to develop in people who have high blood pressure readings, are of African descent, or are overweight.  Have your blood pressure checked: ? Every 3-5 years if you are 18-39 years of age. ? Every year if you are 40 years old or older. Diabetes Have regular diabetes screenings. This checks your fasting blood sugar level. Have the screening done:  Once every three years after age 40 if you are at a normal weight and have a low risk for diabetes.  More often and at a younger age if you are overweight or have a high risk for diabetes. What should I know about preventing infection? Hepatitis B If you have a higher risk for hepatitis B, you should be screened for this virus. Talk with your health care provider to find out if you are at risk for hepatitis B infection. Hepatitis C Testing is recommended for:  Everyone born from 1945 through 1965.  Anyone with known risk factors for hepatitis C. Sexually transmitted infections (STIs)  Get screened for STIs, including gonorrhea and chlamydia, if: ? You are sexually active and are younger than 72 years of age. ? You are older than 72 years of age and your health care provider tells you that you are at risk for this type of infection. ? Your sexual activity has changed since you were last screened, and you are at increased risk for chlamydia or gonorrhea. Ask your health care provider if   you are at risk.  Ask your health care provider about whether you are at high risk for HIV. Your health care provider may recommend a prescription medicine to help prevent HIV infection. If you choose to take medicine to prevent HIV, you should first get tested for HIV. You should then be tested every 3 months for as long as you are taking the medicine. Pregnancy  If you are about to stop having your period (premenopausal) and  you may become pregnant, seek counseling before you get pregnant.  Take 400 to 800 micrograms (mcg) of folic acid every day if you become pregnant.  Ask for birth control (contraception) if you want to prevent pregnancy. Osteoporosis and menopause Osteoporosis is a disease in which the bones lose minerals and strength with aging. This can result in bone fractures. If you are 65 years old or older, or if you are at risk for osteoporosis and fractures, ask your health care provider if you should:  Be screened for bone loss.  Take a calcium or vitamin D supplement to lower your risk of fractures.  Be given hormone replacement therapy (HRT) to treat symptoms of menopause. Follow these instructions at home: Lifestyle  Do not use any products that contain nicotine or tobacco, such as cigarettes, e-cigarettes, and chewing tobacco. If you need help quitting, ask your health care provider.  Do not use street drugs.  Do not share needles.  Ask your health care provider for help if you need support or information about quitting drugs. Alcohol use  Do not drink alcohol if: ? Your health care provider tells you not to drink. ? You are pregnant, may be pregnant, or are planning to become pregnant.  If you drink alcohol: ? Limit how much you use to 0-1 drink a day. ? Limit intake if you are breastfeeding.  Be aware of how much alcohol is in your drink. In the [place] one drink equals one 12 oz bottle of beer (355 mL), one 5 oz glass of wine (148 mL), or one 1 oz glass of hard liquor (44 mL). General instructions  Schedule regular health, dental, and eye exams.  Stay current with your vaccines.  Tell your health care provider if: ? You often feel depressed. ? You have ever been abused or do not feel safe at home. Summary  Adopting a healthy lifestyle and getting preventive care are important in promoting health and wellness.  Follow your health care provider's instructions about healthy  diet, exercising, and getting tested or screened for diseases.  Follow your health care provider's instructions on monitoring your cholesterol and blood pressure. This information is not intended to replace advice given to you by your health care provider. Make sure you discuss any questions you have with your health care provider. Document Revised: 03/01/2018 Document Reviewed: 03/01/2018 Elsevier Patient Education  2020 Else[employer] Inc[employer]  

## 2019-10-12 NOTE — Progress Notes (Signed)
Careteam: Patient Care Team: Lauree Chandler, NP as PCP - General (Geriatric Medicine)  PLACE OF SERVICE:  [facility] Directive information Does Patient Have a Medical Advance Directive?: Yes, Type of Advance Directive: [place];Living will, Does patient want to make changes to medical advance directive?: No - Patient declined  No Known Allergies  Chief Complaint  Patient presents with   Medical Management of Chronic Issues    1 year follow-up    Quality Metric Gaps    Discuss need for colonoscopy, per patient she received letter for GI stating she can wait a few more years     HPI: Patient is a 74 y.o. female for routine follow up.  Chronic pain of left knee- has seen Dr Sharol Given for knee injection but not helping as much this time. Has to call him back.   Hypothyroid- takes without food consistently synthroid 88 mcg.   Hyperlipidemia- fasting today.   Continues to see GYN yearly- continues on estrace 1 mg daily  Health maintenance - trying to eat better, more fresh vegetables, less fried and fatty foods.   Plans to check in with GI to see when she is due for her colonoscopy.   Review of Systems:  Review of Systems  Constitutional: Negative for chills, fever and weight loss.  HENT: Negative for hearing loss.   Respiratory: Negative for cough, sputum production and shortness of breath.   Cardiovascular: Negative for chest pain, palpitations and leg swelling.  Gastrointestinal: Negative for abdominal pain, constipation, diarrhea and heartburn.  Genitourinary: Negative for dysuria, frequency and urgency.  Musculoskeletal: Positive for joint pain. Negative for back pain, falls and myalgias.  Skin: Negative.   Neurological: Negative for dizziness, weakness and headaches.  Psychiatric/Behavioral: Negative for depression and memory loss. The patient is not nervous/anxious and does not have insomnia.     Past Medical History:  Diagnosis  Date   Arthritis    Cataract 11/29/2016   rt eye   Dr Clent Jacks   Hx of adenomatous polyp of colon 06/13/2014   Hypothyroidism    Thyroid disease    Tobacco abuse    Past Surgical History:  Procedure Laterality Date   ORIF FEMUR FRACTURE Right 07/24/2014   Procedure: OPEN REDUCTION INTERNAL FIXATION (ORIF) DISTAL FEMUR FRACTURE;  Surgeon: Newt Minion, MD;  Location: [facility];  Service: Orthopedics;  Laterality: Right;   ORIF PERIPROSTHETIC FRACTURE Right 07/02/2014   Procedure: Open Reduction Internal Fixation Femur Fracture, Revision Femur Total Hip Arthroplasty;  Surgeon: Newt Minion, MD;  Location: [facility];  Service: Orthopedics;  Laterality: Right;   ROTATOR CUFF REPAIR  09-2013   TOTAL HIP ARTHROPLASTY Right 06/19/2014   Procedure: TOTAL HIP ARTHROPLASTY;  Surgeon: Newt Minion, MD;  Location: [facility];  Service: Orthopedics;  Laterality: Right;   VAGINAL HYSTERECTOMY  2001   complete   Social History:   reports that she quit smoking about 3 years ago. Her smoking use included cigarettes. She has a 15.00 pack-year smoking history. She has never used smokeless tobacco. She reports current alcohol use of about 14.0 standard drinks of alcohol per week. She reports that she does not use drugs.  Family History  Problem Relation Age of Onset   Diabetes type II Mother    Hypertension Mother    Diabetes Mother    Colon cancer Neg Hx    Thyroid disease Neg Hx    Stomach cancer Neg Hx  Medications: Patient's Medications  New Prescriptions   No medications on file  Previous Medications   CHOLECALCIFEROL (VITAMIN D3) 2000 UNITS TABS    Take 1 tablet by mouth daily.   CYANOCOBALAMIN (VITAMIN B12 PO)    Take 1 tablet by mouth daily.   ESTRADIOL (ESTRACE) 1 MG TABLET    Take 1 mg by mouth daily.   LEVOTHYROXINE (SYNTHROID) 88 MCG TABLET    TAKE ONE TABLET BY MOUTH ON EMPTY STOMACH 30 MINUTES BEFORE BREAKFAST FOR THYROID.   MULTIPLE VITAMIN (MULTIVITAMIN) TABLET    Take  1 tablet by mouth daily.  Modified Medications   No medications on file  Discontinued Medications   AMOXICILLIN-CLAVULANATE (AUGMENTIN) 875-125 MG TABLET    Take 1 tablet by mouth 2 (two) times daily.    Physical Exam:  Vitals:   10/12/19 1003  BP: 128/72  Pulse: 64  Temp: (!) 95.1 F (35.1 C)  TempSrc: Temporal  SpO2: 97%  Weight: 195 lb 1.6 oz (88.5 kg)  Height: 5' 6.75" (1.695 m)   Body mass index is 30.79 kg/m. Wt Readings from Last 3 Encounters:  10/12/19 195 lb 1.6 oz (88.5 kg)  08/21/19 196 lb (88.9 kg)  05/24/19 196 lb (88.9 kg)    Physical Exam Constitutional:      General: She is not in acute distress.    Appearance: She is well-developed. She is not diaphoretic.  HENT:     Head: Normocephalic and atraumatic.     Mouth/Throat:     Pharynx: No oropharyngeal exudate.  Eyes:     Conjunctiva/sclera: Conjunctivae normal.     Pupils: Pupils are equal, round, and reactive to light.  Cardiovascular:     Rate and Rhythm: Normal rate and regular rhythm.     Heart sounds: Normal heart sounds.  Pulmonary:     Effort: Pulmonary effort is normal.     Breath sounds: Normal breath sounds.  Abdominal:     General: Bowel sounds are normal.     Palpations: Abdomen is soft.  Musculoskeletal:        General: No tenderness.     Cervical back: Normal range of motion and neck supple.  Skin:    General: Skin is warm and dry.  Neurological:     Mental Status: She is alert and oriented to person, place, and time.     Labs reviewed: Basic Metabolic Panel: No results for input(s): NA, K, CL, CO2, GLUCOSE, BUN, CREATININE, CALCIUM, MG, PHOS, TSH in the last 8760 hours. Liver Function Tests: No results for input(s): AST, ALT, ALKPHOS, BILITOT, PROT, ALBUMIN in the last 8760 hours. No results for input(s): LIPASE, AMYLASE in the last 8760 hours. No results for input(s): AMMONIA in the last 8760 hours. CBC: No results for input(s): WBC, NEUTROABS, HGB, HCT, MCV, PLT in the  last 8760 hours. Lipid Panel: No results for input(s): CHOL, HDL, LDLCALC, TRIG, CHOLHDL, LDLDIRECT in the last 8760 hours. TSH: No results for input(s): TSH in the last 8760 hours. A1C: No results found for: HGBA1C   Assessment/Plan 1. Bilateral impacted cerumen -lavage done with good results. Pt tolerated well.   2. Hypothyroidism due to acquired atrophy of thyroid -continues on synthroid 88 mcg - TSH  3. Hyperlipidemia, unspecified hyperlipidemia type Diet controlled. Continued to encouraged dietary modifications and increase in physical activity - Lipid Panel - CMP with eGFR([facility])  4. Primary osteoarthritis of left knee -had knee injection by ortho, plans to follow up due to ongoing discomfort - CBC  with Differential/Platelet  5. Aortic atherosclerosis ([place]) Noted on CT of chest. Will follow up lipid, encourage proper control of hyperlipidemia    Next appt: yearly Eleana Tocco K. [place], [facility] [phone]

## 2019-10-16 ENCOUNTER — Encounter: Payer: Self-pay | Admitting: Nurse Practitioner

## 2019-10-16 ENCOUNTER — Telehealth: Payer: Self-pay

## 2019-10-16 ENCOUNTER — Other Ambulatory Visit: Payer: Self-pay

## 2019-10-16 ENCOUNTER — Ambulatory Visit (INDEPENDENT_AMBULATORY_CARE_PROVIDER_SITE_OTHER): Payer: Medicare HMO | Admitting: Nurse Practitioner

## 2019-10-16 DIAGNOSIS — Z0000 Encounter for general adult medical examination without abnormal findings: Secondary | ICD-10-CM

## 2019-10-16 NOTE — Patient Instructions (Addendum)
Bianca Collins , Thank you for taking time to come for your Medicare Wellness Visit. I appreciate your ongoing commitment to your health goals. Please review the following plan we discussed and let me know if I can assist you in the future.   Screening recommendations/referrals: Colonoscopy to check to see when this is due Mammogram up to date Bone Density up to date through GYN Recommended yearly ophthalmology/optometry visit for glaucoma screening and checkup Recommended yearly dental visit for hygiene and checkup  Vaccinations: Influenza vaccine you have declined  Pneumococcal vaccine recommend pneumonia and prevnar 13. Tdap vaccine DUE, recommend you getting at your local pharmacy Shingles vaccine DUE, recommend you getting at your local pharmacy    Advanced directives: on file  Conditions/risks identified: obesity, based on weight and height, recommend increase in physical activity and healthy eating habits.   Next appointment: 1 year for AWV   Preventive Care 19 Years and Older, Female Preventive care refers to lifestyle choices and visits with your health care provider that can promote health and wellness. What does preventive care include?  A yearly physical exam. This is also called an annual well check.  Dental exams once or twice a year.  Routine eye exams. Ask your health care provider how often you should have your eyes checked.  Personal lifestyle choices, including:  Daily care of your teeth and gums.  Regular physical activity.  Eating a healthy diet.  Avoiding tobacco and drug use.  Limiting alcohol use.  Practicing safe sex.  Taking low-dose aspirin every day.  Taking vitamin and mineral supplements as recommended by your health care provider. What happens during an annual well check? The services and screenings done by your health care provider during your annual well check will depend on your age, overall health, lifestyle risk factors, and family  history of disease. Counseling  Your health care provider may ask you questions about your:  Alcohol use.  Tobacco use.  Drug use.  Emotional well-being.  Home and relationship well-being.  Sexual activity.  Eating habits.  History of falls.  Memory and ability to understand (cognition).  Work and work Statistician.  Reproductive health. Screening  You may have the following tests or measurements:  Height, weight, and BMI.  Blood pressure.  Lipid and cholesterol levels. These may be checked every 5 years, or more frequently if you are over 74 years old.  Skin check.  Lung cancer screening. You may have this screening every year starting at age 25 if you have a 30-pack-year history of smoking and currently smoke or have quit within the past 15 years.  Fecal occult blood test (FOBT) of the stool. You may have this test every year starting at age 40.  Flexible sigmoidoscopy or colonoscopy. You may have a sigmoidoscopy every 5 years or a colonoscopy every 10 years starting at age 72.  Hepatitis C blood test.  Hepatitis B blood test.  Sexually transmitted disease (STD) testing.  Diabetes screening. This is done by checking your blood sugar (glucose) after you have not eaten for a while (fasting). You may have this done every 1-3 years.  Bone density scan. This is done to screen for osteoporosis. You may have this done starting at age 30.  Mammogram. This may be done every 1-2 years. Talk to your health care provider about how often you should have regular mammograms. Talk with your health care provider about your test results, treatment options, and if necessary, the need for more tests. Vaccines  Your health care provider may recommend certain vaccines, such as:  Influenza vaccine. This is recommended every year.  Tetanus, diphtheria, and acellular pertussis (Tdap, Td) vaccine. You may need a Td booster every 10 years.  Zoster vaccine. You may need this after  age 66.  Pneumococcal 13-valent conjugate (PCV13) vaccine. One dose is recommended after age 14.  Pneumococcal polysaccharide (PPSV23) vaccine. One dose is recommended after age 74. Talk to your health care provider about which screenings and vaccines you need and how often you need them. This information is not intended to replace advice given to you by your health care provider. Make sure you discuss any questions you have with your health care provider. Document Released: 04/04/2015 Document Revised: 11/26/2015 Document Reviewed: 01/07/2015 Elsevier Interactive Patient Education  2017 [employer] Prevention in the Home Falls can cause injuries. They can happen to people of all ages. There are many things you can do to make your home safe and to help prevent falls. What can I do on the outside of my home?  Regularly fix the edges of walkways and driveways and fix any cracks.  Remove anything that might make you trip as you walk through a door, such as a raised step or threshold.  Trim any bushes or trees on the path to your home.  Use bright outdoor lighting.  Clear any walking paths of anything that might make someone trip, such as rocks or tools.  Regularly check to see if handrails are loose or broken. Make sure that both sides of any steps have handrails.  Any raised decks and porches should have guardrails on the edges.  Have any leaves, snow, or ice cleared regularly.  Use sand or salt on walking paths during winter.  Clean up any spills in your garage right away. This includes oil or grease spills. What can I do in the bathroom?  Use night lights.  Install grab bars by the toilet and in the tub and shower. Do not use towel bars as grab bars.  Use non-skid mats or decals in the tub or shower.  If you need to sit down in the shower, use a plastic, non-slip stool.  Keep the floor dry. Clean up any water that spills on the floor as soon as it  happens.  Remove soap buildup in the tub or shower regularly.  Attach bath mats securely with double-sided non-slip rug tape.  Do not have throw rugs and other things on the floor that can make you trip. What can I do in the bedroom?  Use night lights.  Make sure that you have a light by your bed that is easy to reach.  Do not use any sheets or blankets that are too big for your bed. They should not hang down onto the floor.  Have a firm chair that has side arms. You can use this for support while you get dressed.  Do not have throw rugs and other things on the floor that can make you trip. What can I do in the kitchen?  Clean up any spills right away.  Avoid walking on wet floors.  Keep items that you use a lot in easy-to-reach places.  If you need to reach something above you, use a strong step stool that has a grab bar.  Keep electrical cords out of the way.  Do not use floor polish or wax that makes floors slippery. If you must use wax, use non-skid floor wax.  Do  not have throw rugs and other things on the floor that can make you trip. What can I do with my stairs?  Do not leave any items on the stairs.  Make sure that there are handrails on both sides of the stairs and use them. Fix handrails that are broken or loose. Make sure that handrails are as long as the stairways.  Check any carpeting to make sure that it is firmly attached to the stairs. Fix any carpet that is loose or worn.  Avoid having throw rugs at the top or bottom of the stairs. If you do have throw rugs, attach them to the floor with carpet tape.  Make sure that you have a light switch at the top of the stairs and the bottom of the stairs. If you do not have them, ask someone to add them for you. What else can I do to help prevent falls?  Wear shoes that:  Do not have high heels.  Have rubber bottoms.  Are comfortable and fit you well.  Are closed at the toe. Do not wear sandals.  If you  use a stepladder:  Make sure that it is fully opened. Do not climb a closed stepladder.  Make sure that both sides of the stepladder are locked into place.  Ask someone to hold it for you, if possible.  Clearly mark and make sure that you can see:  Any grab bars or handrails.  First and last steps.  Where the edge of each step is.  Use tools that help you move around (mobility aids) if they are needed. These include:  Canes.  Walkers.  Scooters.  Crutches.  Turn on the lights when you go into a dark area. Replace any light bulbs as soon as they burn out.  Set up your furniture so you have a clear path. Avoid moving your furniture around.  If any of your floors are uneven, fix them.  If there are any pets around you, be aware of where they are.  Review your medicines with your doctor. Some medicines can make you feel dizzy. This can increase your chance of falling. Ask your doctor what other things that you can do to help prevent falls. This information is not intended to replace advice given to you by your health care provider. Make sure you discuss any questions you have with your health care provider. Document Released: 01/02/2009 Document Revised: 08/14/2015 Document Reviewed: 04/12/2014 Elsevier Interactive Patient Education  2017 [employer].

## 2019-10-16 NOTE — Progress Notes (Signed)
Subjective:   Bianca Collins is a 74 y.o. female who presents for Medicare Annual (Subsequent) preventive examination.  Review of Systems     Cardiac Risk Factors include: advanced age (>72men, >19 women);obesity (BMI >30kg/m2)     Objective:    Today's Vitals   10/16/19 0835  PainSc: 4    There is no height or weight on file to calculate BMI.  Advanced Directives 10/16/2019 10/12/2019 10/11/2018 10/11/2018 12/07/2016 11/28/2015 05/28/2015  Does Patient Have a Medical Advance Directive? Yes Yes Yes Yes Yes No Yes  Type of Paramedic of [place];Living will Healthcare Power of [place] will  Does patient want to make changes to medical advance directive? No - Patient declined No - Patient declined No - Patient declined No - Patient declined No - Patient declined - No - Patient declined  Copy of [place] in Chart? Yes - validated most recent copy scanned in chart (See row information) Yes - validated most recent copy scanned in chart (See row information) Yes - validated most recent copy scanned in chart (See row information) Yes - validated most recent copy scanned in chart (See row information) Yes - No - copy requested  Would patient like information on creating a medical advance directive? - - - - - No - patient declined information -    Current Medications (verified) Outpatient Encounter Medications as of 10/16/2019  Medication Sig   Cholecalciferol (VITAMIN D3) 2000 units TABS Take 1 tablet by mouth daily.   Cyanocobalamin (VITAMIN B12 PO) Take 1 tablet by mouth daily.   estradiol (ESTRACE) 1 MG tablet Take 1 mg by mouth daily.   levothyroxine (SYNTHROID) 88 MCG tablet TAKE ONE TABLET BY MOUTH ON EMPTY STOMACH 30 MINUTES BEFORE BREAKFAST FOR THYROID.   Multiple Vitamin (MULTIVITAMIN) tablet Take 1 tablet by mouth daily.   No facility-administered  encounter medications on file as of 10/16/2019.    Allergies (verified) Patient has no known allergies.   History: Past Medical History:  Diagnosis Date   Arthritis    Cataract 11/29/2016   rt eye   Dr Clent Jacks   Hx of adenomatous polyp of colon 06/13/2014   Hypothyroidism    Thyroid disease    Tobacco abuse    Past Surgical History:  Procedure Laterality Date   ORIF FEMUR FRACTURE Right 07/24/2014   Procedure: OPEN REDUCTION INTERNAL FIXATION (ORIF) DISTAL FEMUR FRACTURE;  Surgeon: Newt Minion, MD;  Location: [facility];  Service: Orthopedics;  Laterality: Right;   ORIF PERIPROSTHETIC FRACTURE Right 07/02/2014   Procedure: Open Reduction Internal Fixation Femur Fracture, Revision Femur Total Hip Arthroplasty;  Surgeon: Newt Minion, MD;  Location: [facility];  Service: Orthopedics;  Laterality: Right;   ROTATOR CUFF REPAIR  09-2013   TOTAL HIP ARTHROPLASTY Right 06/19/2014   Procedure: TOTAL HIP ARTHROPLASTY;  Surgeon: Newt Minion, MD;  Location: [facility];  Service: Orthopedics;  Laterality: Right;   VAGINAL HYSTERECTOMY  2001   complete   Family History  Problem Relation Age of Onset   Diabetes type II Mother    Hypertension Mother    Diabetes Mother    Colon cancer Neg Hx    Thyroid disease Neg Hx    Stomach cancer Neg Hx    Social History   Socioeconomic History   Marital status: Divorced    Spouse name: Not on file   Number of  children: Not on file   Years of education: Not on file   Highest education level: Not on file  Occupational History   Not on file  Tobacco Use   Smoking status: Former Smoker    Packs/day: 0.50    Years: 30.00    Pack years: 15.00    Types: Cigarettes    Quit date: 11/20/2015    Years since quitting: 3.9   Smokeless tobacco: Never Used   Tobacco comment: Patient reports that she is gradually trying to quit  Vaping Use   Vaping Use: Never used  Substance and Sexual Activity   Alcohol use: Yes    Alcohol/week:  14.0 standard drinks    Types: 14 Glasses of wine per week   Drug use: No   Sexual activity: Not on file  Other Topics Concern   Not on file  Social History Narrative   Diet?       Do you drink/eat things with caffeine? Tries not to       Marital status?   divorced                                 What year were you married? 2001      Do you live in a house, apartment, assisted living, condo, trailer, etc.? house      Is it one or more stories? no      How many persons live in your home? 4      Do you have any pets in your home? (please list) 3 cats      Current or past profession: Scientist, water quality, worked for [employer], customer service      Do you exercise?   Social Determinants of Health   Financial Resource Strain:    Difficulty of Paying Living Expenses:   Food Insecurity:    Worried About Charity fundraiser in the Last Year:    Arboriculturist in the Last Year:   Transportation Needs:    Film/video editor (Medical):    Lack of Transportation (Non-Medical):   Physical Activity:    Days of Exercise per Week:    Minutes of Exercise per Session:   Stress:    Feeling of Stress :   Social Connections:    Frequency of Communication with Friends and Family:    Frequency of Social Gatherings with Friends and Family:    Attends Religious Services:    Active Member of Clubs or Organizations:    Attends Music therapist:    Marital Status:     Tobacco Counseling Counseling given: Not Answered Comment: Patient reports that she is gradually trying to quit   Clinical Intake:  Pre-visit preparation completed: Yes  Pain : 0-10 Pain Score: 4  Pain Type: Chronic pain Pain Location: Knee Pain Orientation: Left Pain Descriptors / Indicators: Tender Pain Onset: More than a month ago Pain Frequency: Intermittent Pain Relieving Factors: injection  Pain Relieving Factors: injection  BMI - recorded: 30.79 Nutritional Status: BMI > 30   Obese Diabetes: No  How often do you need to have someone help you when you read instructions, pamphlets, or other written materials from your doctor or pharmacy?: 1 - Never  Diabetic?no         Activities of Daily Living In your present state of health, do you have any difficulty performing the following activities: 10/16/2019  Hearing? N  Vision?  N  Difficulty concentrating or making decisions? N  Walking or climbing stairs? N  Dressing or bathing? N  Doing errands, shopping? N  Preparing Food and eating ? N  Using the Toilet? N  In the past six months, have you accidently leaked urine? Y  Comment cough and sneeze  Do you have problems with loss of bowel control? N  Managing your Medications? N  Managing your Finances? N  Housekeeping or managing your Housekeeping? N  Some recent data might be hidden    Patient Care Team: Lauree Chandler, NP as PCP - General (Geriatric Medicine)  Indicate any recent Medical Services you may have received from other than [facility] providers in the past year (date may be approximate).     Assessment:   This is a routine wellness examination for Chia.  Hearing/Vision screen  Hearing Screening   125Hz  250Hz  500Hz  1000Hz  2000Hz  3000Hz  4000Hz  6000Hz  8000Hz   Right ear:           Left ear:           Comments: Patient states that she has no hearing issues.  Vision Screening Comments: Patient states that she has had a recent eye exam around April 2021. Sees Dr. Katy Fitch  Dietary issues and exercise activities discussed: Current Exercise Habits: The patient does not participate in regular exercise at present  Goals     Increase physical activity     To start going to the gym 2-3 days a week, goal to go 4-5 days a week      Weight (lb) < 165 lb (74.8 kg)     Would like to lose weight through healthy eating and exercise.       Depression Screen PHQ 2/9 Scores 10/16/2019 10/12/2019 05/24/2019 10/11/2018 10/11/2018 02/23/2017 05/28/2015  PHQ - 2  Score 0 0 0 0 0 0 0    Fall Risk Fall Risk  10/16/2019 10/12/2019 05/24/2019 01/01/2019 10/11/2018  Falls in the past year? 0 0 0 0 0  Number falls in past yr: 0 0 0 0 0  Injury with Fall? 0 0 0 0 0    Any stairs in or around the home? Yes  If so, are there any without handrails? Yes  Home free of loose throw rugs in walkways, pet beds, electrical cords, etc? Yes  Adequate lighting in your home to reduce risk of falls? Yes   ASSISTIVE DEVICES UTILIZED TO PREVENT FALLS:  Life alert? No  Use of a cane, walker or w/c? No  Grab bars in the bathroom? No  Shower chair or bench in shower? No  Elevated toilet seat or a handicapped toilet? No   TIMED UP AND GO: na   Cognitive Function: MMSE - Mini Mental State Exam 12/07/2016 05/28/2015 05/28/2015  Not completed: - (No Data) (No Data)  Orientation to time 5 4 4   Orientation to Place 5 5 5   Registration 3 3 3   Attention/ Calculation 5 5 5   Recall 3 3 3   Language- name 2 objects 2 2 2   Language- repeat 1 1 1   Language- follow 3 step command 3 3 3   Language- read & follow direction 1 1 1   Write a sentence 1 1 1   Copy design 0 1 1  Total score 29 29 29      6CIT Screen 10/16/2019 10/11/2018  What Year? 0 points 0 points  What month? 0 points 0 points  What time? 0 points 0 points  Count back from 20 0  points 0 points  Months in reverse 0 points 0 points  Repeat phrase 0 points 0 points  Total Score 0 0    Immunizations Immunization History  Administered Date(s) Administered   PFIZER SARS-COV-2 Vaccination 07/10/2019, 07/31/2019    TDAP status: Due, Education has been provided regarding the importance of this vaccine. Advised may receive this vaccine at local pharmacy or Health Dept. Aware to provide a copy of the vaccination record if obtained from local pharmacy or Health Dept. Verbalized acceptance and understanding. Flu Vaccine status: Declined, Education has been provided regarding the importance of this vaccine but patient still  declined. Advised may receive this vaccine at local pharmacy or Health Dept. Aware to provide a copy of the vaccination record if obtained from local pharmacy or Health Dept. Verbalized acceptance and understanding. Pneumococcal vaccine status: Declined,  Education has been provided regarding the importance of this vaccine but patient still declined. Advised may receive this vaccine at local pharmacy or Health Dept. Aware to provide a copy of the vaccination record if obtained from local pharmacy or Health Dept. Verbalized acceptance and understanding.  Covid-19 vaccine status: Completed vaccines  Qualifies for Shingles Vaccine? Yes   Zostavax completed No   Shingrix Completed?: No.    Education has been provided regarding the importance of this vaccine. Patient has been advised to call insurance company to determine out of pocket expense if they have not yet received this vaccine. Advised may also receive vaccine at local pharmacy or Health Dept. Verbalized acceptance and understanding.  Screening Tests Health Maintenance  Topic Date Due   COLONOSCOPY  06/07/2019   PNA vac Low Risk Adult (1 of 2 - PCV13) 05/23/2020 (Originally 02/09/2013)   TETANUS/TDAP  12/08/2026 (Originally 02/10/1965)   MAMMOGRAM  01/28/2021   DEXA SCAN  Completed   COVID-19 Vaccine  Completed   Hepatitis C Screening  Completed   INFLUENZA VACCINE  Discontinued    Health Maintenance  Health Maintenance Due  Topic Date Due   COLONOSCOPY  06/07/2019    Colorectal cancer screening: Completed 2016. Repeat every 5-8 years Mammogram status: Completed 01/2019. Repeat every year Bone Density status: Completed 2020. Results reflect: Bone density results: NORMAL. Repeat every 2 years.  Lung Cancer Screening: (Low Dose CT Chest recommended if Age 69-80 years, 30 pack-year currently smoking OR have quit w/in 15years.) does not qualify.   Lung Cancer Screening Referral: na  Additional Screening:  Hepatitis C  Screening: does not qualify; Completed 2019   Vision Screening: Recommended annual ophthalmology exams for early detection of glaucoma and other disorders of the eye. Is the patient up to date with their annual eye exam?  Yes  Who is the provider or what is the name of the office in which the patient attends annual eye exams? Dr Clent Jacks If pt is not established with a provider, would they like to be referred to a provider to establish care? No .   Dental Screening: Recommended annual dental exams for proper oral hygiene  Community Resource Referral / Chronic Care Management: CRR required this visit?  No   CCM required this visit?  No      Plan:     I have personally reviewed and noted the following in the patients chart:    Medical and social history  Use of alcohol, tobacco or illicit drugs   Current medications and supplements  Functional ability and status  Nutritional status  Physical activity  Advanced directives  List of other physicians  Hospitalizations, surgeries, and ER visits in previous 12 months  Vitals  Screenings to include cognitive, depression, and falls  Referrals and appointments  In addition, I have reviewed and discussed with patient certain preventive protocols, quality metrics, and best practice recommendations. A written personalized care plan for preventive services as well as general preventive health recommendations were provided to patient.     Lauree Chandler, NP   10/16/2019

## 2019-10-16 NOTE — Telephone Encounter (Signed)
[name], oberry are scheduled for a virtual visit with your provider today.    Just as we do with appointments in the office, we must obtain your consent to participate.  Your consent will be active for this visit and any virtual visit you may have with one of our providers in the next 365 days.    If you have a MyChart account, I can also send a copy of this consent to you electronically.  All virtual visits are billed to your insurance company just like a traditional visit in the office.  As this is a virtual visit, video technology does not allow for your provider to perform a traditional examination.  This may limit your provider's ability to fully assess your condition.  If your provider identifies any concerns that need to be evaluated in person or the need to arrange testing such as labs, EKG, etc, we will make arrangements to do so.    Although advances in technology are sophisticated, we cannot ensure that it will always work on either your end or our end.  If the connection with a video visit is poor, we may have to switch to a telephone visit.  With either a video or telephone visit, we are not always able to ensure that we have a secure connection.   I need to obtain your verbal consent now.   Are you willing to proceed with your visit today?   Bianca Collins has provided verbal consent on 10/16/2019 for a virtual visit (video or telephone).   Carroll Kinds, CMA 10/16/2019  8:30 AM

## 2019-10-16 NOTE — Progress Notes (Signed)
This service is provided via telemedicine  No vital signs collected/recorded due to the encounter was a telemedicine visit.   Location of patient (ex: home, work):  Home  Patient consents to a telephone visit:  Yes, see encounter dated 10/16/2019  Location of the provider (ex: office, home):  [place]  Name of any referring provider:  N/A  Names of all persons participating in the telemedicine service and their role in the encounter:  Sherrie Mustache, Nurse Practitioner, Carroll Kinds, CMA, and patient.   Time spent on call:  5 minutes with medical assistant

## 2019-10-19 ENCOUNTER — Other Ambulatory Visit: Payer: Self-pay | Admitting: Nurse Practitioner

## 2019-10-19 DIAGNOSIS — E034 Atrophy of thyroid (acquired): Secondary | ICD-10-CM

## 2019-10-24 DIAGNOSIS — B078 Other viral warts: Secondary | ICD-10-CM | POA: Diagnosis not present

## 2019-11-15 ENCOUNTER — Encounter: Payer: Self-pay | Admitting: Orthopedic Surgery

## 2019-11-15 ENCOUNTER — Ambulatory Visit (INDEPENDENT_AMBULATORY_CARE_PROVIDER_SITE_OTHER): Payer: Medicare HMO | Admitting: Physician Assistant

## 2019-11-15 ENCOUNTER — Telehealth: Payer: Self-pay

## 2019-11-15 VITALS — Ht 66.75 in | Wt 195.1 lb

## 2019-11-15 DIAGNOSIS — M1712 Unilateral primary osteoarthritis, left knee: Secondary | ICD-10-CM

## 2019-11-15 NOTE — Progress Notes (Signed)
Office Visit Note   Patient: Bianca Collins           Date of Birth: 10-10-1945           MRN: [id number] Visit Date: 11/15/2019              Requested by: Lauree Chandler, NP [address],  [place] [address] PCP: Lauree Chandler, NP  Chief Complaint  Patient presents with  . Left Knee - Pain      HPI: This is a pleasant 73 year old woman with a history of chronic left knee pain.  She had a steroid injection in November which seemed to last for several months.  She repeated the steroid injection in early June and only had a short-lived period of relief.  She also does strengthening exercises to keep her legs strong.  She does wear a knee sleeve.  X-rays in the past have shown medial varus arthritis of her knee.  She would like to avoid a knee replacement right now if possible  Assessment & Plan: Visit Diagnoses: No diagnosis found.  Plan: Discussed the option of trying viscosupplementation.  She has failed other conservative treatment and would like to go forward with this.  We will contact her when we have obtained insurance authorization  Follow-Up Instructions: No follow-ups on file.   Ortho Exam  Patient is alert, oriented, no adenopathy, well-dressed, normal affect, normal respiratory effort. Left knee: No cellulitis mild soft tissue swelling no effusion.  Crepitus with extension and flexion of the knee tenderness over the medial joint line.  No evidence of any infective process  Imaging: No results found. No images are attached to the encounter.  Labs: No results found for: HGBA1C, ESRSEDRATE, CRP, LABURIC, REPTSTATUS, GRAMSTAIN, CULT, LABORGA   Lab Results  Component Value Date   ALBUMIN 4.0 05/28/2015   ALBUMIN 4.0 06/10/2014    No results found for: MG No results found for: VD25OH  No results found for: PREALBUMIN CBC EXTENDED Latest Ref Rng & Units 10/12/2019 03/21/2018 05/28/2015  WBC 3.8 - 10.8 Thousand/uL 9.7 8.9 9.0  RBC 3.80 - 5.10  Million/uL 4.31 4.21 4.10  HGB 11.7 - 15.5 g/dL 13.8 13.6 13.3  HCT 35 - 45 % 41.2 39.7 39.8  PLT 140 - 400 Thousand/uL 275 275 329  NEUTROABS 1,500 - 7,800 cells/uL 6,645 5,135 5.4  LYMPHSABS 850 - 3,900 cells/uL 2,018 2,510 2.6     Body mass index is 30.79 kg/m.  Orders:  No orders of the defined types were placed in this encounter.  No orders of the defined types were placed in this encounter.    Procedures: No procedures performed  Clinical Data: No additional findings.  ROS:  All other systems negative, except as noted in the HPI. Review of Systems  Objective: Vital Signs: Ht 5' 6.75" (1.695 m)   Wt 195 lb 1.6 oz (88.5 kg)   BMI 30.79 kg/m   Specialty Comments:  No specialty comments available.  PMFS History: Patient Active Problem List   Diagnosis Date Noted  . Aortic atherosclerosis ([place]) 01/12/2019  . Femur fracture, right ([place]) 07/25/2014  . Closed femur fracture ([place]) 07/24/2014  . Periprosthetic fracture around internal prosthetic right hip joint ([place]) 07/05/2014  . Periprosth fracture around internal prosth r hip jt, sequela 07/02/2014  . H/O total hip arthroplasty 06/19/2014  . Hx of adenomatous polyp of colon 06/13/2014  . Hypothyroidism 06/06/2014   Past Medical History:  Diagnosis Date  . Arthritis   .  Cataract 11/29/2016   rt eye   Dr Clent Jacks  . Hx of adenomatous polyp of colon 06/13/2014  . Hypothyroidism   . Thyroid disease   . Tobacco abuse     Family History  Problem Relation Age of Onset  . Diabetes type II Mother   . Hypertension Mother   . Diabetes Mother   . Colon cancer Neg Hx   . Thyroid disease Neg Hx   . Stomach cancer Neg Hx     Past Surgical History:  Procedure Laterality Date  . ORIF FEMUR FRACTURE Right 07/24/2014   Procedure: OPEN REDUCTION INTERNAL FIXATION (ORIF) DISTAL FEMUR FRACTURE;  Surgeon: Newt Minion, MD;  Location: [facility];  Service: Orthopedics;  Laterality: Right;  . ORIF PERIPROSTHETIC FRACTURE  Right 07/02/2014   Procedure: Open Reduction Internal Fixation Femur Fracture, Revision Femur Total Hip Arthroplasty;  Surgeon: Newt Minion, MD;  Location: [facility];  Service: Orthopedics;  Laterality: Right;  . ROTATOR CUFF REPAIR  09-2013  . TOTAL HIP ARTHROPLASTY Right 06/19/2014   Procedure: TOTAL HIP ARTHROPLASTY;  Surgeon: Newt Minion, MD;  Location: [facility];  Service: Orthopedics;  Laterality: Right;  [place] VAGINAL HYSTERECTOMY  2001   complete   Social History   Occupational History  . Not on file  Tobacco Use  . Smoking status: Former Smoker    Packs/day: 0.50    Years: 30.00    Pack years: 15.00    Types: Cigarettes    Quit date: 11/20/2015    Years since quitting: 3.9  . Smokeless tobacco: Never Used  . Tobacco comment: Patient reports that she is gradually trying to quit  Vaping Use  . Vaping Use: Never used  Substance and Sexual Activity  . Alcohol use: Yes    Alcohol/week: 14.0 standard drinks    Types: 14 Glasses of wine per week  . Drug use: No  . Sexual activity: Not on file

## 2019-11-15 NOTE — Telephone Encounter (Signed)
Left knee gel injection OA last x ray 02/19/2019 tried and failed cortisone injections

## 2019-11-15 NOTE — Telephone Encounter (Signed)
Noted  

## 2019-11-16 ENCOUNTER — Telehealth: Payer: Self-pay

## 2019-11-16 NOTE — Telephone Encounter (Signed)
Subnmitted VOB, Gel-One, left knee.

## 2019-11-21 DIAGNOSIS — L820 Inflamed seborrheic keratosis: Secondary | ICD-10-CM | POA: Diagnosis not present

## 2019-11-21 DIAGNOSIS — L908 Other atrophic disorders of skin: Secondary | ICD-10-CM | POA: Diagnosis not present

## 2019-12-03 ENCOUNTER — Telehealth: Payer: Self-pay

## 2019-12-03 NOTE — Telephone Encounter (Signed)
PA required for Gel-One, left knee. Faxed completed PA form to [employer] at [phone] and to [employer] at [phone].

## 2019-12-07 ENCOUNTER — Telehealth: Payer: Self-pay

## 2019-12-07 NOTE — Telephone Encounter (Signed)
Approved, Gel-One, left knee. [place] Patient will be responsible for 20% OOP Co-pay of $25.00 PA required PA Approval# [id number] Valid 12/03/2019- 03/03/2020

## 2019-12-12 ENCOUNTER — Telehealth: Payer: Self-pay

## 2019-12-12 NOTE — Telephone Encounter (Signed)
Approved, Gel-One, left knee. [place] Patient will be responsible for 20% OOP. Co-pay of $25.00 PA required PA Approval# [id number] Valid 12/03/2019- 03/03/2020  Appt. 12/14/2019

## 2019-12-14 ENCOUNTER — Ambulatory Visit (INDEPENDENT_AMBULATORY_CARE_PROVIDER_SITE_OTHER): Payer: Medicare HMO | Admitting: Physician Assistant

## 2019-12-14 ENCOUNTER — Encounter: Payer: Self-pay | Admitting: Physician Assistant

## 2019-12-14 VITALS — Ht 66.75 in | Wt 195.0 lb

## 2019-12-14 DIAGNOSIS — M1712 Unilateral primary osteoarthritis, left knee: Secondary | ICD-10-CM | POA: Diagnosis not present

## 2019-12-14 MED ORDER — CROSS-LINK HYAL ACID (VISC) 30 MG/3ML IX PRSY
30.0000 mg | PREFILLED_SYRINGE | INTRA_ARTICULAR | Status: AC | PRN
  Administered 2019-12-14: 30 mg via INTRA_ARTICULAR

## 2019-12-14 NOTE — Progress Notes (Signed)
Office Visit Note   Patient: Bianca Collins           Date of Birth: 1945/06/23           MRN: [id number] Visit Date: 12/14/2019              Requested by: Lauree Chandler, NP [place],  [place] [address] PCP: Lauree Chandler, NP  Chief Complaint  Patient presents with  . Left Knee - Follow-up    Gel One      HPI: Is a pleasant 74 year old woman who comes in today for a gel 1 injection into her left knee.  She has a history of osteoarthritis.  She had been getting steroid injections but these no longer seemed quite as helpful  Assessment & Plan: Visit Diagnoses: No diagnosis found.  Plan: Patient will follow up as needed.  Was told to ice a little bit tonight if she needs to  Follow-Up Instructions: No follow-ups on file.   Ortho Exam  Patient is alert, oriented, no adenopathy, well-dressed, normal affect, normal respiratory effort.  Left knee mild soft tissue swelling no effusion no cellulitis skin is in excellent condition   Imaging: No results found. No images are attached to the encounter.  Labs: No results found for: HGBA1C, ESRSEDRATE, CRP, LABURIC, REPTSTATUS, GRAMSTAIN, CULT, LABORGA   Lab Results  Component Value Date   ALBUMIN 4.0 05/28/2015   ALBUMIN 4.0 06/10/2014    No results found for: MG No results found for: VD25OH  No results found for: PREALBUMIN CBC EXTENDED Latest Ref Rng & Units 10/12/2019 03/21/2018 05/28/2015  WBC 3.8 - 10.8 Thousand/uL 9.7 8.9 9.0  RBC 3.80 - 5.10 Million/uL 4.31 4.21 4.10  HGB 11.7 - 15.5 g/dL 13.8 13.6 13.3  HCT 35 - 45 % 41.2 39.7 39.8  PLT 140 - 400 Thousand/uL 275 275 329  NEUTROABS 1,500 - 7,800 cells/uL 6,645 5,135 5.4  LYMPHSABS 850 - 3,900 cells/uL 2,018 2,510 2.6     Body mass index is 30.77 kg/m.  Orders:  No orders of the defined types were placed in this encounter.  No orders of the defined types were placed in this encounter.    Procedures: Large Joint Inj: L knee on  12/14/2019 1:18 PM Indications: pain and diagnostic evaluation Details: 22 G 1.5 in needle, anterolateral approach  Arthrogram: No  Medications: 30 mg Cross-Linked Hyaluronate 30 MG/3ML Outcome: tolerated well, no immediate complications Procedure, treatment alternatives, risks and benefits explained, specific risks discussed. Consent was given by the patient.      Clinical Data: No additional findings.  ROS:  All other systems negative, except as noted in the HPI. Review of Systems  Objective: Vital Signs: Ht 5' 6.75" (1.695 m)   Wt 195 lb (88.5 kg)   BMI 30.77 kg/m   Specialty Comments:  No specialty comments available.  PMFS History: Patient Active Problem List   Diagnosis Date Noted  . Aortic atherosclerosis ([facility]) 01/12/2019  . Femur fracture, right ([place]) 07/25/2014  . Closed femur fracture ([place]) 07/24/2014  . Periprosthetic fracture around internal prosthetic right hip joint ([place]) 07/05/2014  . Periprosth fracture around internal prosth r hip jt, sequela 07/02/2014  . H/O total hip arthroplasty 06/19/2014  . Hx of adenomatous polyp of colon 06/13/2014  . Hypothyroidism 06/06/2014   Past Medical History:  Diagnosis Date  . Arthritis   . Cataract 11/29/2016   rt eye   Dr Clent Jacks  . Hx of adenomatous  polyp of colon 06/13/2014  . Hypothyroidism   . Thyroid disease   . Tobacco abuse     Family History  Problem Relation Age of Onset  . Diabetes type II Mother   . Hypertension Mother   . Diabetes Mother   . Colon cancer Neg Hx   . Thyroid disease Neg Hx   . Stomach cancer Neg Hx     Past Surgical History:  Procedure Laterality Date  . ORIF FEMUR FRACTURE Right 07/24/2014   Procedure: OPEN REDUCTION INTERNAL FIXATION (ORIF) DISTAL FEMUR FRACTURE;  Surgeon: Newt Minion, MD;  Location: [facility];  Service: Orthopedics;  Laterality: Right;  . ORIF PERIPROSTHETIC FRACTURE Right 07/02/2014   Procedure: Open Reduction Internal Fixation Femur Fracture, Revision  Femur Total Hip Arthroplasty;  Surgeon: Newt Minion, MD;  Location: [facility];  Service: Orthopedics;  Laterality: Right;  . ROTATOR CUFF REPAIR  09-2013  . TOTAL HIP ARTHROPLASTY Right 06/19/2014   Procedure: TOTAL HIP ARTHROPLASTY;  Surgeon: Newt Minion, MD;  Location: [facility];  Service: Orthopedics;  Laterality: Right;  Marland Kitchen VAGINAL HYSTERECTOMY  2001   complete   Social History   Occupational History  . Not on file  Tobacco Use  . Smoking status: Former Smoker    Packs/day: 0.50    Years: 30.00    Pack years: 15.00    Types: Cigarettes    Quit date: 11/20/2015    Years since quitting: 4.0  . Smokeless tobacco: Never Used  . Tobacco comment: Patient reports that she is gradually trying to quit  Vaping Use  . Vaping Use: Never used  Substance and Sexual Activity  . Alcohol use: Yes    Alcohol/week: 14.0 standard drinks    Types: 14 Glasses of wine per week  . Drug use: No  . Sexual activity: Not on file

## 2020-01-08 ENCOUNTER — Ambulatory Visit (INDEPENDENT_AMBULATORY_CARE_PROVIDER_SITE_OTHER): Payer: Medicare HMO | Admitting: Physician Assistant

## 2020-01-08 ENCOUNTER — Encounter: Payer: Self-pay | Admitting: Physician Assistant

## 2020-01-08 DIAGNOSIS — M25562 Pain in left knee: Secondary | ICD-10-CM

## 2020-01-08 NOTE — Progress Notes (Signed)
Office Visit Note   Patient: Bianca Collins           Date of Birth: 12-01-1945           MRN: [id number] Visit Date: 01/08/2020              Requested by: Lauree Chandler, NP [place],  [place] [address] PCP: Lauree Chandler, NP  Chief Complaint  Patient presents with  . Left Knee - Pain      HPI: Patient presents today with continuing left knee pain.  She has had gel injections and steroid injections been have not provided her any relief.  She states that all of this began 4 to 5 months ago.  This was after a fall.  She describes mechanical symptoms such as having to move her knee around when it straight to get it moving again with some associated pain.  Also gets some catching.  Focuses on the medial side of her knee  Assessment & Plan: Visit Diagnoses:  1. Acute pain of left knee     Plan: Patient has failed conservative treatment.  She has mechanical symptoms.  This is now been going on for 5 to 6 months.  Recommend an MRI to evaluate meniscal pathology.  Follow-Up Instructions: No follow-ups on file.   Ortho Exam  Patient is alert, oriented, no adenopathy, well-dressed, normal affect, normal respiratory effort. Focused examination of her left knee demonstrates no effusion no cellulitis.  She has acute tenderness over the medial joint line this is accentuated with terminal extension and flexion.  Imaging: No results found. No images are attached to the encounter.  Labs: No results found for: HGBA1C, ESRSEDRATE, CRP, LABURIC, REPTSTATUS, GRAMSTAIN, CULT, LABORGA   Lab Results  Component Value Date   ALBUMIN 4.0 05/28/2015   ALBUMIN 4.0 06/10/2014    No results found for: MG No results found for: VD25OH  No results found for: PREALBUMIN CBC EXTENDED Latest Ref Rng & Units 10/12/2019 03/21/2018 05/28/2015  WBC 3.8 - 10.8 Thousand/uL 9.7 8.9 9.0  RBC 3.80 - 5.10 Million/uL 4.31 4.21 4.10  HGB 11.7 - 15.5 g/dL 13.8 13.6 13.3  HCT 35 - 45 % 41.2  39.7 39.8  PLT 140 - 400 Thousand/uL 275 275 329  NEUTROABS 1,500 - 7,800 cells/uL 6,645 5,135 5.4  LYMPHSABS 850 - 3,900 cells/uL 2,018 2,510 2.6     There is no height or weight on file to calculate BMI.  Orders:  Orders Placed This Encounter  Procedures  . MR Knee Left w/o contrast   No orders of the defined types were placed in this encounter.    Procedures: No procedures performed  Clinical Data: No additional findings.  ROS:  All other systems negative, except as noted in the HPI. Review of Systems  Objective: Vital Signs: There were no vitals taken for this visit.  Specialty Comments:  No specialty comments available.  PMFS History: Patient Active Problem List   Diagnosis Date Noted  . Aortic atherosclerosis ([facility]) 01/12/2019  . Femur fracture, right ([facility]) 07/25/2014  . Closed femur fracture ([facility]) 07/24/2014  . Periprosthetic fracture around internal prosthetic right hip joint ([facility]) 07/05/2014  . Periprosth fracture around internal prosth r hip jt, sequela 07/02/2014  . H/O total hip arthroplasty 06/19/2014  . Hx of adenomatous polyp of colon 06/13/2014  . Hypothyroidism 06/06/2014   Past Medical History:  Diagnosis Date  . Arthritis   . Cataract 11/29/2016   rt eye  Dr Clent Jacks  . Hx of adenomatous polyp of colon 06/13/2014  . Hypothyroidism   . Thyroid disease   . Tobacco abuse     Family History  Problem Relation Age of Onset  . Diabetes type II Mother   . Hypertension Mother   . Diabetes Mother   . Colon cancer Neg Hx   . Thyroid disease Neg Hx   . Stomach cancer Neg Hx     Past Surgical History:  Procedure Laterality Date  . ORIF FEMUR FRACTURE Right 07/24/2014   Procedure: OPEN REDUCTION INTERNAL FIXATION (ORIF) DISTAL FEMUR FRACTURE;  Surgeon: Newt Minion, MD;  Location: [facility];  Service: Orthopedics;  Laterality: Right;  . ORIF PERIPROSTHETIC FRACTURE Right 07/02/2014   Procedure: Open Reduction Internal Fixation Femur Fracture,  Revision Femur Total Hip Arthroplasty;  Surgeon: Newt Minion, MD;  Location: [facility];  Service: Orthopedics;  Laterality: Right;  . ROTATOR CUFF REPAIR  09-2013  . TOTAL HIP ARTHROPLASTY Right 06/19/2014   Procedure: TOTAL HIP ARTHROPLASTY;  Surgeon: Newt Minion, MD;  Location: [facility];  Service: Orthopedics;  Laterality: Right;  Marland Kitchen VAGINAL HYSTERECTOMY  2001   complete   Social History   Occupational History  . Not on file  Tobacco Use  . Smoking status: Former Smoker    Packs/day: 0.50    Years: 30.00    Pack years: 15.00    Types: Cigarettes    Quit date: 11/20/2015    Years since quitting: 4.1  . Smokeless tobacco: Never Used  . Tobacco comment: Patient reports that she is gradually trying to quit  Vaping Use  . Vaping Use: Never used  Substance and Sexual Activity  . Alcohol use: Yes    Alcohol/week: 14.0 standard drinks    Types: 14 Glasses of wine per week  . Drug use: No  . Sexual activity: Not on file

## 2020-01-27 ENCOUNTER — Ambulatory Visit
Admission: RE | Admit: 2020-01-27 | Discharge: 2020-01-27 | Disposition: A | Discharge status: Home / Self Care | Payer: Medicare HMO | Source: Ambulatory Visit | Attending: Physician Assistant | Admitting: Physician Assistant

## 2020-01-27 ENCOUNTER — Other Ambulatory Visit: Payer: Self-pay

## 2020-01-27 DIAGNOSIS — S83282A Other tear of lateral meniscus, current injury, left knee, initial encounter: Secondary | ICD-10-CM | POA: Diagnosis not present

## 2020-01-27 DIAGNOSIS — M25562 Pain in left knee: Secondary | ICD-10-CM

## 2020-01-27 DIAGNOSIS — S83232A Complex tear of medial meniscus, current injury, left knee, initial encounter: Secondary | ICD-10-CM | POA: Diagnosis not present

## 2020-01-27 DIAGNOSIS — M25462 Effusion, left knee: Secondary | ICD-10-CM | POA: Diagnosis not present

## 2020-01-31 ENCOUNTER — Encounter: Payer: Self-pay | Admitting: Orthopedic Surgery

## 2020-01-31 ENCOUNTER — Ambulatory Visit (INDEPENDENT_AMBULATORY_CARE_PROVIDER_SITE_OTHER): Payer: Medicare HMO | Admitting: Orthopedic Surgery

## 2020-01-31 VITALS — Ht 66.0 in | Wt 195.0 lb

## 2020-01-31 DIAGNOSIS — M23204 Derangement of unspecified medial meniscus due to old tear or injury, left knee: Secondary | ICD-10-CM

## 2020-01-31 DIAGNOSIS — M1712 Unilateral primary osteoarthritis, left knee: Secondary | ICD-10-CM | POA: Diagnosis not present

## 2020-01-31 DIAGNOSIS — Z01419 Encounter for gynecological examination (general) (routine) without abnormal findings: Secondary | ICD-10-CM | POA: Diagnosis not present

## 2020-01-31 DIAGNOSIS — Z6831 Body mass index (BMI) 31.0-31.9, adult: Secondary | ICD-10-CM | POA: Diagnosis not present

## 2020-01-31 NOTE — Progress Notes (Signed)
Office Visit Note   Patient: Bianca Collins           Date of Birth: 05-Aug-1945           MRN: [id number] Visit Date: 01/31/2020              Requested by: Lauree Chandler, NP [place],  [place] [address] PCP: Lauree Chandler, NP  Chief Complaint  Patient presents with   Left Knee - Follow-up    MRI review       HPI: Patient is a 74 year old woman who presents with persistent mechanical pain of the left knee.  She states she used to have symptoms that would resolve with Voltaren gel but she states that now she has constant mechanical symptoms and is unable to perform her activities of daily living.  Assessment & Plan: Visit Diagnoses:  1. Primary osteoarthritis of left knee   2. Old complex tear of medial meniscus of left knee     Plan: We will plan for left knee arthroscopy risk and benefits were discussed including the risk of persistent pain secondary to her arthritis and and the potential for a total knee arthroplasty risk of infection.  Patient states she understands wished to proceed at this time.  Follow-Up Instructions: Return if symptoms worsen or fail to improve, for Follow-up 1 to 2 weeks after arthroscopy of her left knee.Manson Passey Exam  Patient is alert, oriented, no adenopathy, well-dressed, normal affect, normal respiratory effort. Examination patient has crepitation with range of motion of the left knee collaterals and cruciates are stable she is tender to palpation of the medial lateral joint line as well as the patellofemoral joint.  Review of the MRI scan shows complex tear of the medial meniscus as well as a tear of the lateral meniscus there is full-thickness cartilage loss of the medial joint line and cartilage arthritic changes of the patellofemoral joint.  Imaging: No results found. No images are attached to the encounter.  Labs: No results found for: HGBA1C, ESRSEDRATE, CRP, LABURIC, REPTSTATUS, GRAMSTAIN, CULT, LABORGA   Lab  Results  Component Value Date   ALBUMIN 4.0 05/28/2015   ALBUMIN 4.0 06/10/2014    No results found for: MG No results found for: VD25OH  No results found for: PREALBUMIN CBC EXTENDED Latest Ref Rng & Units 10/12/2019 03/21/2018 05/28/2015  WBC 3.8 - 10.8 Thousand/uL 9.7 8.9 9.0  RBC 3.80 - 5.10 Million/uL 4.31 4.21 4.10  HGB 11.7 - 15.5 g/dL 13.8 13.6 13.3  HCT 35 - 45 % 41.2 39.7 39.8  PLT 140 - 400 Thousand/uL 275 275 329  NEUTROABS 1,500 - 7,800 cells/uL 6,645 5,135 5.4  LYMPHSABS 850 - 3,900 cells/uL 2,018 2,510 2.6     Body mass index is 31.47 kg/m.  Orders:  No orders of the defined types were placed in this encounter.  No orders of the defined types were placed in this encounter.    Procedures: No procedures performed  Clinical Data: No additional findings.  ROS:  All other systems negative, except as noted in the HPI. Review of Systems  Objective: Vital Signs: Ht 5\' 6"  (1.676 m)    Wt 195 lb (88.5 kg)    BMI 31.47 kg/m   Specialty Comments:  No specialty comments available.  PMFS History: Patient Active Problem List   Diagnosis Date Noted   Aortic atherosclerosis ([facility]) 01/12/2019   Femur fracture, right ([facility]) 07/25/2014   Closed femur fracture ([facility]) 07/24/2014  Periprosthetic fracture around internal prosthetic right hip joint ([facility]) 07/05/2014   Periprosth fracture around internal prosth r hip jt, sequela 07/02/2014   H/O total hip arthroplasty 06/19/2014   Hx of adenomatous polyp of colon 06/13/2014   Hypothyroidism 06/06/2014   Past Medical History:  Diagnosis Date   Arthritis    Cataract 11/29/2016   rt eye   Dr Clent Jacks   Hx of adenomatous polyp of colon 06/13/2014   Hypothyroidism    Thyroid disease    Tobacco abuse     Family History  Problem Relation Age of Onset   Diabetes type II Mother    Hypertension Mother    Diabetes Mother    Colon cancer Neg Hx    Thyroid disease Neg Hx    Stomach cancer Neg Hx       Past Surgical History:  Procedure Laterality Date   ORIF FEMUR FRACTURE Right 07/24/2014   Procedure: OPEN REDUCTION INTERNAL FIXATION (ORIF) DISTAL FEMUR FRACTURE;  Surgeon: Newt Minion, MD;  Location: [facility];  Service: Orthopedics;  Laterality: Right;   ORIF PERIPROSTHETIC FRACTURE Right 07/02/2014   Procedure: Open Reduction Internal Fixation Femur Fracture, Revision Femur Total Hip Arthroplasty;  Surgeon: Newt Minion, MD;  Location: [facility];  Service: Orthopedics;  Laterality: Right;   ROTATOR CUFF REPAIR  09-2013   TOTAL HIP ARTHROPLASTY Right 06/19/2014   Procedure: TOTAL HIP ARTHROPLASTY;  Surgeon: Newt Minion, MD;  Location: [facility];  Service: Orthopedics;  Laterality: Right;   VAGINAL HYSTERECTOMY  2001   complete   Social History   Occupational History   Not on file  Tobacco Use   Smoking status: Former Smoker    Packs/day: 0.50    Years: 30.00    Pack years: 15.00    Types: Cigarettes    Quit date: 11/20/2015    Years since quitting: 4.2   Smokeless tobacco: Never Used   Tobacco comment: Patient reports that she is gradually trying to quit  Vaping Use   Vaping Use: Never used  Substance and Sexual Activity   Alcohol use: Yes    Alcohol/week: 14.0 standard drinks    Types: 14 Glasses of wine per week   Drug use: No   Sexual activity: Not on file

## 2020-03-20 ENCOUNTER — Other Ambulatory Visit: Payer: Self-pay | Admitting: Physician Assistant

## 2020-03-25 ENCOUNTER — Other Ambulatory Visit: Payer: Self-pay

## 2020-03-25 ENCOUNTER — Encounter (HOSPITAL_BASED_OUTPATIENT_CLINIC_OR_DEPARTMENT_OTHER): Payer: Self-pay | Admitting: Orthopedic Surgery

## 2020-03-28 ENCOUNTER — Other Ambulatory Visit (HOSPITAL_COMMUNITY)
Admission: RE | Admit: 2020-03-28 | Discharge: 2020-03-28 | Disposition: A | Discharge status: Home / Self Care | Payer: Medicare HMO | Source: Ambulatory Visit | Attending: Orthopedic Surgery | Admitting: Orthopedic Surgery

## 2020-03-28 DIAGNOSIS — Z01812 Encounter for preprocedural laboratory examination: Secondary | ICD-10-CM | POA: Insufficient documentation

## 2020-03-28 DIAGNOSIS — Z20822 Contact with and (suspected) exposure to covid-19: Secondary | ICD-10-CM | POA: Diagnosis not present

## 2020-03-28 LAB — SARS CORONAVIRUS 2 (TAT 6-24 HRS): SARS Coronavirus 2: NEGATIVE

## 2020-03-31 NOTE — Progress Notes (Signed)

## 2020-04-01 ENCOUNTER — Ambulatory Visit (HOSPITAL_BASED_OUTPATIENT_CLINIC_OR_DEPARTMENT_OTHER)
Admission: RE | Admit: 2020-04-01 | Discharge: 2020-04-01 | Disposition: A | Discharge status: Home / Self Care | Payer: Medicare HMO | Attending: Orthopedic Surgery | Admitting: Orthopedic Surgery

## 2020-04-01 ENCOUNTER — Ambulatory Visit (HOSPITAL_BASED_OUTPATIENT_CLINIC_OR_DEPARTMENT_OTHER): Payer: Medicare HMO | Admitting: Anesthesiology

## 2020-04-01 ENCOUNTER — Encounter (HOSPITAL_BASED_OUTPATIENT_CLINIC_OR_DEPARTMENT_OTHER)
Admission: RE | Disposition: A | Discharge status: Home / Self Care | Payer: Self-pay | Source: Home / Self Care | Attending: Orthopedic Surgery

## 2020-04-01 ENCOUNTER — Encounter (HOSPITAL_BASED_OUTPATIENT_CLINIC_OR_DEPARTMENT_OTHER): Payer: Self-pay | Admitting: Orthopedic Surgery

## 2020-04-01 ENCOUNTER — Other Ambulatory Visit: Payer: Self-pay

## 2020-04-01 DIAGNOSIS — M1712 Unilateral primary osteoarthritis, left knee: Secondary | ICD-10-CM | POA: Diagnosis not present

## 2020-04-01 DIAGNOSIS — M23204 Derangement of unspecified medial meniscus due to old tear or injury, left knee: Secondary | ICD-10-CM | POA: Diagnosis not present

## 2020-04-01 DIAGNOSIS — X58XXXA Exposure to other specified factors, initial encounter: Secondary | ICD-10-CM | POA: Diagnosis not present

## 2020-04-01 DIAGNOSIS — M958 Other specified acquired deformities of musculoskeletal system: Secondary | ICD-10-CM

## 2020-04-01 DIAGNOSIS — M23201 Derangement of unspecified lateral meniscus due to old tear or injury, left knee: Secondary | ICD-10-CM

## 2020-04-01 DIAGNOSIS — Z87891 Personal history of nicotine dependence: Secondary | ICD-10-CM | POA: Insufficient documentation

## 2020-04-01 DIAGNOSIS — S83282A Other tear of lateral meniscus, current injury, left knee, initial encounter: Secondary | ICD-10-CM | POA: Diagnosis not present

## 2020-04-01 DIAGNOSIS — Z96641 Presence of right artificial hip joint: Secondary | ICD-10-CM | POA: Insufficient documentation

## 2020-04-01 DIAGNOSIS — E039 Hypothyroidism, unspecified: Secondary | ICD-10-CM | POA: Diagnosis not present

## 2020-04-01 DIAGNOSIS — M948X6 Other specified disorders of cartilage, lower leg: Secondary | ICD-10-CM

## 2020-04-01 DIAGNOSIS — S83272A Complex tear of lateral meniscus, current injury, left knee, initial encounter: Secondary | ICD-10-CM | POA: Insufficient documentation

## 2020-04-01 DIAGNOSIS — S83242A Other tear of medial meniscus, current injury, left knee, initial encounter: Secondary | ICD-10-CM | POA: Diagnosis not present

## 2020-04-01 HISTORY — PX: CHONDROPLASTY: SHX5177

## 2020-04-01 HISTORY — PX: KNEE ARTHROSCOPY WITH MEDIAL MENISECTOMY: SHX5651

## 2020-04-01 HISTORY — PX: KNEE ARTHROSCOPY WITH LATERAL MENISECTOMY: SHX6193

## 2020-04-01 SURGERY — ARTHROSCOPY, KNEE, WITH MEDIAL MENISCECTOMY
Anesthesia: General | Site: Knee | Laterality: Left

## 2020-04-01 MED ORDER — HYDROMORPHONE HCL 1 MG/ML IJ SOLN
INTRAMUSCULAR | Status: AC
  Filled 2020-04-01: qty 0.5

## 2020-04-01 MED ORDER — ARTIFICIAL TEARS OPHTHALMIC OINT
TOPICAL_OINTMENT | OPHTHALMIC | Status: AC
  Filled 2020-04-01: qty 3.5

## 2020-04-01 MED ORDER — ACETAMINOPHEN 500 MG PO TABS
1000.0000 mg | ORAL_TABLET | Freq: Once | ORAL | Status: AC
  Administered 2020-04-01: 1000 mg via ORAL

## 2020-04-01 MED ORDER — ONDANSETRON HCL 4 MG/2ML IJ SOLN
INTRAMUSCULAR | Status: AC
  Filled 2020-04-01: qty 2

## 2020-04-01 MED ORDER — MIDAZOLAM HCL 2 MG/2ML IJ SOLN
INTRAMUSCULAR | Status: DC | PRN
  Administered 2020-04-01: 2 mg via INTRAVENOUS

## 2020-04-01 MED ORDER — DEXMEDETOMIDINE (PRECEDEX) IN NS 20 MCG/5ML (4 MCG/ML) IV SYRINGE
PREFILLED_SYRINGE | INTRAVENOUS | Status: DC | PRN
  Administered 2020-04-01 (×5): 8 ug via INTRAVENOUS

## 2020-04-01 MED ORDER — FENTANYL CITRATE (PF) 100 MCG/2ML IJ SOLN
INTRAMUSCULAR | Status: AC
  Filled 2020-04-01: qty 2

## 2020-04-01 MED ORDER — LACTATED RINGERS IV SOLN: INTRAVENOUS | Status: DC

## 2020-04-01 MED ORDER — GLYCOPYRROLATE 0.2 MG/ML IJ SOLN
INTRAMUSCULAR | Status: AC
  Filled 2020-04-01: qty 1

## 2020-04-01 MED ORDER — HYDROMORPHONE HCL 1 MG/ML IJ SOLN
0.2500 mg | INTRAMUSCULAR | Status: DC | PRN
  Administered 2020-04-01 (×2): 0.25 mg via INTRAVENOUS

## 2020-04-01 MED ORDER — FENTANYL CITRATE (PF) 100 MCG/2ML IJ SOLN
INTRAMUSCULAR | Status: DC | PRN
  Administered 2020-04-01 (×2): 50 ug via INTRAVENOUS

## 2020-04-01 MED ORDER — HYDROCODONE-ACETAMINOPHEN 5-325 MG PO TABS: 1.0000 | ORAL_TABLET | ORAL | 0 refills | Status: DC | PRN

## 2020-04-01 MED ORDER — DEXMEDETOMIDINE (PRECEDEX) IN NS 20 MCG/5ML (4 MCG/ML) IV SYRINGE
PREFILLED_SYRINGE | INTRAVENOUS | Status: AC
  Filled 2020-04-01: qty 10

## 2020-04-01 MED ORDER — PROPOFOL 10 MG/ML IV BOLUS
INTRAVENOUS | Status: AC
  Filled 2020-04-01: qty 20

## 2020-04-01 MED ORDER — CEFAZOLIN SODIUM-DEXTROSE 2-4 GM/100ML-% IV SOLN
INTRAVENOUS | Status: AC
  Filled 2020-04-01: qty 100

## 2020-04-01 MED ORDER — LIDOCAINE HCL (CARDIAC) PF 100 MG/5ML IV SOSY
PREFILLED_SYRINGE | INTRAVENOUS | Status: DC | PRN
  Administered 2020-04-01: 60 mg via INTRATRACHEAL

## 2020-04-01 MED ORDER — ONDANSETRON HCL 4 MG/2ML IJ SOLN
INTRAMUSCULAR | Status: DC | PRN
  Administered 2020-04-01: 4 mg via INTRAVENOUS

## 2020-04-01 MED ORDER — SODIUM CHLORIDE 0.9 % IR SOLN
Status: DC | PRN
  Administered 2020-04-01: 6000 mL

## 2020-04-01 MED ORDER — GLYCOPYRROLATE 0.2 MG/ML IJ SOLN
INTRAMUSCULAR | Status: DC | PRN
  Administered 2020-04-01: .1 mg via INTRAVENOUS

## 2020-04-01 MED ORDER — DEXAMETHASONE SODIUM PHOSPHATE 10 MG/ML IJ SOLN
INTRAMUSCULAR | Status: AC
  Filled 2020-04-01: qty 1

## 2020-04-01 MED ORDER — LIDOCAINE 2% (20 MG/ML) 5 ML SYRINGE
INTRAMUSCULAR | Status: AC
  Filled 2020-04-01: qty 5

## 2020-04-01 MED ORDER — FENTANYL CITRATE (PF) 100 MCG/2ML IJ SOLN
25.0000 ug | INTRAMUSCULAR | Status: DC | PRN
Start: 2020-04-01 — End: 2020-04-01
  Administered 2020-04-01 (×2): 50 ug via INTRAVENOUS

## 2020-04-01 MED ORDER — DEXAMETHASONE SODIUM PHOSPHATE 10 MG/ML IJ SOLN
INTRAMUSCULAR | Status: DC | PRN
  Administered 2020-04-01: 10 mg via INTRAVENOUS

## 2020-04-01 MED ORDER — ACETAMINOPHEN 500 MG PO TABS
ORAL_TABLET | ORAL | Status: AC
  Filled 2020-04-01: qty 2

## 2020-04-01 MED ORDER — MIDAZOLAM HCL 2 MG/2ML IJ SOLN
INTRAMUSCULAR | Status: AC
  Filled 2020-04-01: qty 2

## 2020-04-01 MED ORDER — BUPIVACAINE HCL (PF) 0.5 % IJ SOLN
INTRAMUSCULAR | Status: DC | PRN
  Administered 2020-04-01: 20 mL via INTRA_ARTICULAR

## 2020-04-01 MED ORDER — PROPOFOL 10 MG/ML IV BOLUS
INTRAVENOUS | Status: DC | PRN
  Administered 2020-04-01: 150 mg via INTRAVENOUS

## 2020-04-01 MED ORDER — CEFAZOLIN SODIUM-DEXTROSE 2-4 GM/100ML-% IV SOLN
2.0000 g | INTRAVENOUS | Status: AC
  Administered 2020-04-01: 2 g via INTRAVENOUS

## 2020-04-01 SURGICAL SUPPLY — 34 items
BLADE EXCALIBUR 4.0X13 (MISCELLANEOUS) ×2 IMPLANT
BNDG COHESIVE 4X5 TAN STRL (GAUZE/BANDAGES/DRESSINGS) ×2 IMPLANT
BNDG COHESIVE 6X5 TAN STRL LF (GAUZE/BANDAGES/DRESSINGS) ×2 IMPLANT
COVER WAND RF STERILE (DRAPES) IMPLANT
DISSECTOR 4.0MM X 13CM (MISCELLANEOUS) IMPLANT
DRAPE ARTHROSCOPY W/POUCH 90 (DRAPES) ×2 IMPLANT
DRAPE U-SHAPE 47X51 STRL (DRAPES) ×2 IMPLANT
DRSG EMULSION OIL 3X3 NADH (GAUZE/BANDAGES/DRESSINGS) ×2 IMPLANT
DURAPREP 26ML APPLICATOR (WOUND CARE) ×2 IMPLANT
EXCALIBUR 3.8MM X 13CM (MISCELLANEOUS) IMPLANT
GAUZE SPONGE 4X4 12PLY STRL (GAUZE/BANDAGES/DRESSINGS) ×2 IMPLANT
GLOVE BIOGEL PI IND STRL 7.0 (GLOVE) ×3 IMPLANT
GLOVE BIOGEL PI IND STRL 9 (GLOVE) ×1 IMPLANT
GLOVE BIOGEL PI INDICATOR 7.0 (GLOVE) ×3
GLOVE BIOGEL PI INDICATOR 9 (GLOVE) ×1
GLOVE ECLIPSE 6.5 STRL STRAW (GLOVE) ×4 IMPLANT
GLOVE SURG ORTHO 9.0 STRL STRW (GLOVE) ×2 IMPLANT
GOWN STRL REUS W/ TWL LRG LVL3 (GOWN DISPOSABLE) ×3 IMPLANT
GOWN STRL REUS W/ TWL XL LVL3 (GOWN DISPOSABLE) ×1 IMPLANT
GOWN STRL REUS W/TWL LRG LVL3 (GOWN DISPOSABLE) ×6
GOWN STRL REUS W/TWL XL LVL3 (GOWN DISPOSABLE) ×4 IMPLANT
IV NS IRRIG 3000ML ARTHROMATIC (IV SOLUTION) ×4 IMPLANT
MANIFOLD NEPTUNE II (INSTRUMENTS) IMPLANT
NDL SAFETY ECLIPSE 18X1.5 (NEEDLE) ×1 IMPLANT
NEEDLE HYPO 18GX1.5 SHARP (NEEDLE) ×2
PACK ARTHROSCOPY DSU (CUSTOM PROCEDURE TRAY) ×2 IMPLANT
PACK BASIN DAY SURGERY FS (CUSTOM PROCEDURE TRAY) ×2 IMPLANT
PORT APPOLLO RF 90DEGREE MULTI (SURGICAL WAND) IMPLANT
PROBE APOLLO 90XL (SURGICAL WAND) ×2 IMPLANT
STOCKINETTE IMPERVIOUS LG (DRAPES) ×2 IMPLANT
SUT ETHILON 2 0 FSLX (SUTURE) ×2 IMPLANT
TOWEL GREEN STERILE FF (TOWEL DISPOSABLE) ×2 IMPLANT
TUBING ARTHROSCOPY IRRIG 16FT (MISCELLANEOUS) ×2 IMPLANT
WRAP KNEE MAXI GEL POST OP (GAUZE/BANDAGES/DRESSINGS) ×2 IMPLANT

## 2020-04-01 NOTE — Anesthesia Procedure Notes (Signed)
Procedure Name: LMA Insertion Date/Time: 04/01/2020 8:51 AM Performed by: Collier Bullock, CRNA Oxygen Delivery Method: Circle system utilized Induction Type: IV induction Ventilation: Mask ventilation without difficulty LMA Size: 4.0

## 2020-04-01 NOTE — Interval H&P Note (Signed)
History and Physical Interval Note:  04/01/2020 7:22 AM  Bianca Collins  has presented today for surgery, with the diagnosis of Osteoarthritis and Meniscal Tear Left Knee.  The various methods of treatment have been discussed with the patient and family. After consideration of risks, benefits and other options for treatment, the patient has consented to  Procedure(s): LEFT KNEE ARTHROSCOPY AND DEBRIDEMENT (Left) as a surgical intervention.  The patient's history has been reviewed, patient examined, no change in status, stable for surgery.  I have reviewed the patient's chart and labs.  Questions were answered to the patient's satisfaction.     Newt Minion

## 2020-04-01 NOTE — Discharge Instructions (Signed)
NO TYLENOL UNTIL 2:20pm TODAY, IF NEEDED.  Post Anesthesia Home Care Instructions  Activity: Get plenty of rest for the remainder of the day. A responsible individual must stay with you for 24 hours following the procedure.  For the next 24 hours, DO NOT: -Drive a car -Paediatric nurse -Drink alcoholic beverages -Take any medication unless instructed by your physician -Make any legal decisions or sign important papers.  Meals: Start with liquid foods such as gelatin or soup. Progress to regular foods as tolerated. Avoid greasy, spicy, heavy foods. If nausea and/or vomiting occur, drink only clear liquids until the nausea and/or vomiting subsides. Call your physician if vomiting continues.  Special Instructions/Symptoms: Your throat may feel dry or sore from the anesthesia or the breathing tube placed in your throat during surgery. If this causes discomfort, gargle with warm salt water. The discomfort should disappear within 24 hours.  If you had a scopolamine patch placed behind your ear for the management of post- operative nausea and/or vomiting:  1. The medication in the patch is effective for 72 hours, after which it should be removed.  Wrap patch in a tissue and discard in the trash. Wash hands thoroughly with soap and water. 2. You may remove the patch earlier than 72 hours if you experience unpleasant side effects which may include dry mouth, dizziness or visual disturbances. 3. Avoid touching the patch. Wash your hands with soap and water after contact with the patch.

## 2020-04-01 NOTE — H&P (Signed)
Bianca Collins is an 75 y.o. female.   Chief Complaint: left knee pain HPI: Patient is a 75 year old woman who presents with persistent mechanical pain of the left knee.  She states she used to have symptoms that would resolve with Voltaren gel but she states that now she has constant mechanical symptoms and is unable to perform her activities of daily living.  Past Medical History:  Diagnosis Date  . Arthritis   . Cataract 11/29/2016   rt eye   Dr Bianca Collins  . Hx of adenomatous polyp of colon 06/13/2014  . Hypothyroidism   . Thyroid disease   . Tobacco abuse     Past Surgical History:  Procedure Laterality Date  . ORIF FEMUR FRACTURE Right 07/24/2014   Procedure: OPEN REDUCTION INTERNAL FIXATION (ORIF) DISTAL FEMUR FRACTURE;  Surgeon: Bianca Minion, MD;  Location: [facility];  Service: Orthopedics;  Laterality: Right;  . ORIF PERIPROSTHETIC FRACTURE Right 07/02/2014   Procedure: Open Reduction Internal Fixation Femur Fracture, Revision Femur Total Hip Arthroplasty;  Surgeon: Bianca Minion, MD;  Location: [facility];  Service: Orthopedics;  Laterality: Right;  . ROTATOR CUFF REPAIR  09-2013  . TOTAL HIP ARTHROPLASTY Right 06/19/2014   Procedure: TOTAL HIP ARTHROPLASTY;  Surgeon: Bianca Minion, MD;  Location: [facility];  Service: Orthopedics;  Laterality: Right;  Marland Kitchen VAGINAL HYSTERECTOMY  2001   complete    Family History  Problem Relation Age of Onset  . Diabetes type II Mother   . Hypertension Mother   . Diabetes Mother   . Colon cancer Neg Hx   . Thyroid disease Neg Hx   . Stomach cancer Neg Hx    Social History:  reports that she quit smoking about 4 years ago. Her smoking use included cigarettes. She has a 15.00 pack-year smoking history. She has never used smokeless tobacco. She reports current alcohol use of about 14.0 standard drinks of alcohol per week. She reports that she does not use drugs.  Allergies: No Known Allergies  No medications prior to admission.    No results found for this  or any previous visit (from the past 48 hour(s)). No results found.  Review of Systems  All other systems reviewed and are negative.   Height 5\' 6"  (1.676 m), weight 87.1 kg. Physical Exam  Patient is alert, oriented, no adenopathy, well-dressed, normal affect, normal respiratory effort. Examination patient has crepitation with range of motion of the left knee collaterals and cruciates are stable she is tender to palpation of the medial lateral joint line as well as the patellofemoral joint.  Review of the MRI scan shows complex tear of the medial meniscus as well as a tear of the lateral meniscus there is full-thickness cartilage loss of the medial joint line and cartilage arthritic changes of the patellofemoral joint.Heart RRR Lungs Clear Assessment/Plan 1. Primary osteoarthritis of left knee   2. Old complex tear of medial meniscus of left knee     Plan: We will plan for left knee arthroscopy risk and benefits were discussed including the risk of persistent pain secondary to her arthritis and and the potential for a total knee arthroplasty risk of infection.  Patient states she understands wished to proceed at this time.   Bevely Palmer [name], PA 04/01/2020, 6:00 AM

## 2020-04-01 NOTE — Anesthesia Preprocedure Evaluation (Addendum)
Anesthesia Evaluation  Patient identified by MRN, date of birth, ID band Patient awake    Reviewed: Allergy & Precautions, NPO status , Patient's Chart, lab work & pertinent test results  Airway Mallampati: II  TM Distance: >3 FB Neck ROM: Full    Dental  (+) Dental Advisory Given, Missing,    Pulmonary COPD, former smoker,    Pulmonary exam normal breath sounds clear to auscultation       Cardiovascular + CAD  Normal cardiovascular exam Rhythm:Regular Rate:Normal  Chest CT 2020 1. Lung-RADS 2S, benign appearance or behavior. Continue annual screening with low-dose chest CT without contrast in 12 months. 2. The "S" modifier above refers to potentially clinically significant non lung cancer related findings. Specifically, there is aortic atherosclerosis, in addition to left anterior descending coronary artery disease. Assessment for potential risk factor modification, dietary therapy or pharmacologic therapy may be warranted, if clinically indicated. 3. Mild diffuse bronchial wall thickening with very mild centrilobular and paraseptal emphysema; imaging findings suggestive of underlying COPD.   Neuro/Psych negative neurological ROS  negative psych ROS   GI/Hepatic negative GI ROS, Neg liver ROS,   Endo/Other  Hypothyroidism   Renal/GU negative Renal ROS  negative genitourinary   Musculoskeletal  (+) Arthritis ,   Abdominal   Peds  Hematology negative hematology ROS (+)   Anesthesia Other Findings   Reproductive/Obstetrics                           Anesthesia Physical Anesthesia Plan  ASA: III  Anesthesia Plan: General   Post-op Pain Management:    Induction: Intravenous  PONV Risk Score and Plan: 3 and Ondansetron, Dexamethasone and Midazolam  Airway Management Planned: LMA  Additional Equipment:   Intra-op Plan:   Post-operative Plan: Extubation in OR  Informed Consent: I  have reviewed the patients History and Physical, chart, labs and discussed the procedure including the risks, benefits and alternatives for the proposed anesthesia with the patient or authorized representative who has indicated his/her understanding and acceptance.     Dental advisory given  Plan Discussed with: CRNA  Anesthesia Plan Comments:         Anesthesia Quick Evaluation

## 2020-04-01 NOTE — Op Note (Signed)
04/01/2020  9:44 AM  PATIENT:  Bianca Collins    PRE-OPERATIVE DIAGNOSIS:  Osteoarthritis and Meniscal Tear Left Knee  POST-OPERATIVE DIAGNOSIS:  Same  PROCEDURE:  LEFT KNEE ARTHROSCOPY  WITH MEDIAL AND LATERAL MENISECTOMIES, CHONDROPLASTY AND DEBRIDEMENT, KNEE ARTHROSCOPY WITH LATERAL MENISECTOMY, CHONDROPLASTY  SURGEON:  Newt Minion, MD  PHYSICIAN ASSISTANT:None ANESTHESIA:   General  PREOPERATIVE INDICATIONS:  Bianca Collins is a  75 y.o. female with a diagnosis of Osteoarthritis and Meniscal Tear Left Knee who failed conservative measures and elected for surgical management.    The risks benefits and alternatives were discussed with the patient preoperatively including but not limited to the risks of infection, bleeding, nerve injury, cardiopulmonary complications, the need for revision surgery, among others, and the patient was willing to proceed.  OPERATIVE IMPLANTS: none  @ENCIMAGES @  OPERATIVE FINDINGS: Patient had a large osteochondral defect medial femoral condyle medial tibial plateau lateral femoral condyle lateral tibial plateau patella and trochlea with large degenerative tearing of the medial and lateral meniscus.  There was significant inflammatory cellulitis with a large hemarthrosis.  OPERATIVE PROCEDURE: Patient was brought the operating room and underwent a general anesthetic.  After adequate levels anesthesia were obtained patient's left lower extremity was prepped using DuraPrep draped into a sterile field a timeout was called.  The scope was inserted to the anterior lateral portal anterior medial working portal established.  Visualization patient had a significant amount of hemarthrosis there was inflammation of the synovial lining with increased vascularity.  The electrical wand was used for hemostasis and initial debridement of the synovitis this was followed by the shaver for further synovectomy.  With valgus stress the patient had a large degenerative tear of the medial  meniscus.  The shaver was used to debride the complex tear of the medial meniscus and the electrical wand was used for hemostasis.  Patient had a large osteochondral defect of the medial tibial plateau and the medial femoral condyle and the shaver was used to perform abrasion chondroplasty back to bleeding viable subchondral bone.  Examination of the notch showed an intact ACL and PCL.  Examination lateral joint line figure-of-four position also showed a large complex tear of the lateral meniscus there was degenerative changes of the lateral femoral condyle and lateral tibial plateau the shaver was used to debride the meniscal tear as well as perform abrasion chondroplasty laterally.  With the knee extended patient had significant synovitis in the suprapatellar pouch as well as anterior to the patella and trochlea.  The electrical wand was used for initial hemostasis and this was further debrided with the shaver.  Patient had large osteochondral defect involving the patella and trochlea and this was also debrided.  A survey of all compartments was performed there were no loose bodies.  The instruments were removed the portals were closed using 2-0 nylon a sterile dressing was applied patient's joint was injected with a total of 20 cc of quarter percent Marcaine plain.  Patient was extubated taken the PACU in stable condition.   DISCHARGE PLANNING:  Antibiotic duration: Preoperative antibiotics  Weightbearing: Weightbearing as tolerated  Pain medication: Prescription for Vicodin  Dressing care/ Wound VAC: Discontinue dressing in 2 days  Ambulatory devices: Patient has a walker  Discharge to: Home.  Follow-up: In the office 1 week post operative.

## 2020-04-01 NOTE — Anesthesia Postprocedure Evaluation (Signed)
Anesthesia Post Note  Patient: Bianca Collins  Procedure(s) Performed: LEFT KNEE ARTHROSCOPY  WITH MEDIAL AND LATERAL MENISECTOMIES, CHONDROPLASTY AND DEBRIDEMENT (Left Knee) KNEE ARTHROSCOPY WITH LATERAL MENISECTOMY (Left Knee) CHONDROPLASTY (Left Knee)     Patient location during evaluation: PACU Anesthesia Type: General Level of consciousness: awake and alert Pain management: pain level controlled Vital Signs Assessment: post-procedure vital signs reviewed and stable Respiratory status: spontaneous breathing, nonlabored ventilation, respiratory function stable and patient connected to nasal cannula oxygen Cardiovascular status: blood pressure returned to baseline and stable Postop Assessment: no apparent nausea or vomiting Anesthetic complications: no   No complications documented.  Last Vitals:  Vitals:   04/01/20 1028 04/01/20 1115  BP:  136/71  Pulse: 60 64  Resp: 12 12  Temp:  36.4 C  SpO2: 100% 96%    Last Pain:  Vitals:   04/01/20 1051  TempSrc:   PainSc: 0-No pain                 Bianca Collins L Kaydyn Chism

## 2020-04-01 NOTE — Transfer of Care (Signed)
Immediate Anesthesia Transfer of Care Note  Patient: Bianca Collins  Procedure(s) Performed: LEFT KNEE ARTHROSCOPY  WITH MEDIAL AND LATERAL MENISECTOMIES, CHONDROPLASTY AND DEBRIDEMENT (Left Knee) KNEE ARTHROSCOPY WITH LATERAL MENISECTOMY (Left Knee) CHONDROPLASTY (Left Knee)  Patient Location: PACU  Anesthesia Type:General  Level of Consciousness: awake and patient cooperative  Airway & Oxygen Therapy: Patient Spontanous Breathing and Patient connected to face mask oxygen  Post-op Assessment: Report given to RN, Post -op Vital signs reviewed and stable, Patient moving all extremities X 4 and Patient able to stick tongue midline  Post vital signs: Reviewed and stable  Last Vitals:  Vitals Value Taken Time  BP 140/71 04/01/20 0937  Temp    Pulse 84 04/01/20 0938  Resp    SpO2 97 % 04/01/20 0938  Vitals shown include unvalidated device data.  Last Pain:  Vitals:   04/01/20 0726  TempSrc: Oral  PainSc: 4       Patients Stated Pain Goal: 4 (41/32/44 0102)  Complications: No complications documented.

## 2020-04-02 ENCOUNTER — Encounter (HOSPITAL_BASED_OUTPATIENT_CLINIC_OR_DEPARTMENT_OTHER): Payer: Self-pay | Admitting: Orthopedic Surgery

## 2020-04-15 ENCOUNTER — Ambulatory Visit (INDEPENDENT_AMBULATORY_CARE_PROVIDER_SITE_OTHER): Payer: Medicare HMO | Admitting: Physician Assistant

## 2020-04-15 ENCOUNTER — Encounter: Payer: Self-pay | Admitting: Physician Assistant

## 2020-04-15 DIAGNOSIS — M958 Other specified acquired deformities of musculoskeletal system: Secondary | ICD-10-CM

## 2020-04-15 NOTE — Progress Notes (Signed)
Office Visit Note   Patient: Bianca Collins           Date of Birth: 1945/03/24           MRN: [id number] Visit Date: 04/15/2020              Requested by: Lauree Chandler, NP [place],  [place] [address] PCP: Lauree Chandler, NP  Chief Complaint  Patient presents with  . Left Knee - Routine Post Op    04/01/20 left knee scope medial and lateral menisectomies chondroplasty and debridement       HPI: Patient is almost 2 weeks status post left knee arthroscopy with medial lateral meniscectomies with chondroplasty and debridement she is working on exercises on her own and her biggest complaint is that her knee is stiff  Assessment & Plan: Visit Diagnoses: No diagnosis found.  Plan: Continue working on close chain exercises with quadricep strengthening and range of motion.  We will follow-up in 3 weeks.  Follow-Up Instructions: No follow-ups on file.   Ortho Exam  Patient is alert, oriented, no adenopathy, well-dressed, normal affect, normal respiratory effort. Well-healed surgical portals.  She does have moderate soft tissue swelling but no ecchymosis.  No erythema no cellulitis.  She has full extension and flexion to 100 degrees.  Compartments are soft and nontender negative Homans' sign  Imaging: No results found. No images are attached to the encounter.  Labs: No results found for: HGBA1C, ESRSEDRATE, CRP, LABURIC, REPTSTATUS, GRAMSTAIN, CULT, LABORGA   Lab Results  Component Value Date   ALBUMIN 4.0 05/28/2015   ALBUMIN 4.0 06/10/2014    No results found for: MG No results found for: VD25OH  No results found for: PREALBUMIN CBC EXTENDED Latest Ref Rng & Units 10/12/2019 03/21/2018 05/28/2015  WBC 3.8 - 10.8 Thousand/uL 9.7 8.9 9.0  RBC 3.80 - 5.10 Million/uL 4.31 4.21 4.10  HGB 11.7 - 15.5 g/dL 13.8 13.6 13.3  HCT 35.0 - 45.0 % 41.2 39.7 39.8  PLT 140 - 400 Thousand/uL 275 275 329  NEUTROABS 1,500 - 7,800 cells/uL 6,645 5,135 5.4  LYMPHSABS  850 - 3,900 cells/uL 2,018 2,510 2.6     There is no height or weight on file to calculate BMI.  Orders:  No orders of the defined types were placed in this encounter.  No orders of the defined types were placed in this encounter.    Procedures: No procedures performed  Clinical Data: No additional findings.  ROS:  All other systems negative, except as noted in the HPI. Review of Systems  Objective: Vital Signs: There were no vitals taken for this visit.  Specialty Comments:  No specialty comments available.  PMFS History: Patient Active Problem List   Diagnosis Date Noted  . Osteochondral defect of femoral condyle   . Old complex tear of lateral meniscus of left knee   . Old complex tear of medial meniscus of left knee   . Aortic atherosclerosis ([place]) 01/12/2019  . Femur fracture, right ([place]) 07/25/2014  . Closed femur fracture ([place]) 07/24/2014  . Periprosthetic fracture around internal prosthetic right hip joint ([place]) 07/05/2014  . Periprosth fracture around internal prosth r hip jt, sequela 07/02/2014  . H/O total hip arthroplasty 06/19/2014  . Hx of adenomatous polyp of colon 06/13/2014  . Hypothyroidism 06/06/2014   Past Medical History:  Diagnosis Date  . Arthritis   . Cataract 11/29/2016   rt eye   Dr Clent Jacks  . Hx of adenomatous  polyp of colon 06/13/2014  . Hypothyroidism   . Thyroid disease   . Tobacco abuse     Family History  Problem Relation Age of Onset  . Diabetes type II Mother   . Hypertension Mother   . Diabetes Mother   . Colon cancer Neg Hx   . Thyroid disease Neg Hx   . Stomach cancer Neg Hx     Past Surgical History:  Procedure Laterality Date  . CHONDROPLASTY Left 04/01/2020   Procedure: CHONDROPLASTY;  Surgeon: Newt Minion, MD;  Location: [facility];  Service: Orthopedics;  Laterality: Left;  . KNEE ARTHROSCOPY WITH LATERAL MENISECTOMY Left 04/01/2020   Procedure: KNEE ARTHROSCOPY WITH LATERAL MENISECTOMY;   Surgeon: Newt Minion, MD;  Location: [facility];  Service: Orthopedics;  Laterality: Left;  . KNEE ARTHROSCOPY WITH MEDIAL MENISECTOMY Left 04/01/2020   Procedure: LEFT KNEE ARTHROSCOPY  WITH MEDIAL AND LATERAL MENISECTOMIES, CHONDROPLASTY AND DEBRIDEMENT;  Surgeon: Newt Minion, MD;  Location: [facility];  Service: Orthopedics;  Laterality: Left;  . ORIF FEMUR FRACTURE Right 07/24/2014   Procedure: OPEN REDUCTION INTERNAL FIXATION (ORIF) DISTAL FEMUR FRACTURE;  Surgeon: Newt Minion, MD;  Location: [facility];  Service: Orthopedics;  Laterality: Right;  . ORIF PERIPROSTHETIC FRACTURE Right 07/02/2014   Procedure: Open Reduction Internal Fixation Femur Fracture, Revision Femur Total Hip Arthroplasty;  Surgeon: Newt Minion, MD;  Location: [facility];  Service: Orthopedics;  Laterality: Right;  . ROTATOR CUFF REPAIR  09-2013  . TOTAL HIP ARTHROPLASTY Right 06/19/2014   Procedure: TOTAL HIP ARTHROPLASTY;  Surgeon: Newt Minion, MD;  Location: [facility];  Service: Orthopedics;  Laterality: Right;  Marland Kitchen VAGINAL HYSTERECTOMY  2001   complete   Social History   Occupational History  . Not on file  Tobacco Use  . Smoking status: Former Smoker    Packs/day: 0.50    Years: 30.00    Pack years: 15.00    Types: Cigarettes    Quit date: 11/20/2015    Years since quitting: 4.4  . Smokeless tobacco: Never Used  . Tobacco comment: Patient reports that she is gradually trying to quit  Vaping Use  . Vaping Use: Never used  Substance and Sexual Activity  . Alcohol use: Yes    Alcohol/week: 14.0 standard drinks    Types: 14 Glasses of wine per week  . Drug use: No  . Sexual activity: Not on file

## 2020-05-06 ENCOUNTER — Encounter: Payer: Self-pay | Admitting: Physician Assistant

## 2020-05-06 ENCOUNTER — Ambulatory Visit (INDEPENDENT_AMBULATORY_CARE_PROVIDER_SITE_OTHER): Payer: Medicare HMO | Admitting: Physician Assistant

## 2020-05-06 DIAGNOSIS — M958 Other specified acquired deformities of musculoskeletal system: Secondary | ICD-10-CM

## 2020-05-06 NOTE — Progress Notes (Signed)
Office Visit Note   Patient: Bianca Collins           Date of Birth: 04/29/1945           MRN: [id number] Visit Date: 05/06/2020              Requested by: Lauree Chandler, NP [place],  [place] [address] PCP: Lauree Chandler, NP  No chief complaint on file.     HPI: Patient presents today 1 month status post  knee arthroscopy.  She feels she is turning the corner and has been working on an exercise program on her own.  She says she is beginning to have more good days than bad  Assessment & Plan: Visit Diagnoses: No diagnosis found.  Plan: Continue strengthening and range of motion May follow-up as needed.  Follow-Up Instructions: No follow-ups on file.   Ortho Exam  Patient is alert, oriented, no adenopathy, well-dressed, normal affect, normal respiratory effort. Examination of her knee demonstrates well-healed surgical portals.  No swelling no warmth no erythema or cellulitis.  Range of motion is fairly painless.  Compartments are soft and nontender.  Imaging: No results found. No images are attached to the encounter.  Labs: No results found for: HGBA1C, ESRSEDRATE, CRP, LABURIC, REPTSTATUS, GRAMSTAIN, CULT, LABORGA   Lab Results  Component Value Date   ALBUMIN 4.0 05/28/2015   ALBUMIN 4.0 06/10/2014    No results found for: MG No results found for: VD25OH  No results found for: PREALBUMIN CBC EXTENDED Latest Ref Rng & Units 10/12/2019 03/21/2018 05/28/2015  WBC 3.8 - 10.8 Thousand/uL 9.7 8.9 9.0  RBC 3.80 - 5.10 Million/uL 4.31 4.21 4.10  HGB 11.7 - 15.5 g/dL 13.8 13.6 13.3  HCT 35.0 - 45.0 % 41.2 39.7 39.8  PLT 140 - 400 Thousand/uL 275 275 329  NEUTROABS 1,500 - 7,800 cells/uL 6,645 5,135 5.4  LYMPHSABS 850 - 3,900 cells/uL 2,018 2,510 2.6     There is no height or weight on file to calculate BMI.  Orders:  No orders of the defined types were placed in this encounter.  No orders of the defined types were placed in this  encounter.    Procedures: No procedures performed  Clinical Data: No additional findings.  ROS:  All other systems negative, except as noted in the HPI. Review of Systems  Objective: Vital Signs: There were no vitals taken for this visit.  Specialty Comments:  No specialty comments available.  PMFS History: Patient Active Problem List   Diagnosis Date Noted  . Osteochondral defect of femoral condyle   . Old complex tear of lateral meniscus of left knee   . Old complex tear of medial meniscus of left knee   . Aortic atherosclerosis ([facility]) 01/12/2019  . Femur fracture, right ([facility]) 07/25/2014  . Closed femur fracture ([facility]) 07/24/2014  . Periprosthetic fracture around internal prosthetic right hip joint ([facility]) 07/05/2014  . Periprosth fracture around internal prosth r hip jt, sequela 07/02/2014  . H/O total hip arthroplasty 06/19/2014  . Hx of adenomatous polyp of colon 06/13/2014  . Hypothyroidism 06/06/2014   Past Medical History:  Diagnosis Date  . Arthritis   . Cataract 11/29/2016   rt eye   Dr Clent Jacks  . Hx of adenomatous polyp of colon 06/13/2014  . Hypothyroidism   . Thyroid disease   . Tobacco abuse     Family History  Problem Relation Age of Onset  . Diabetes type II Mother   .  Hypertension Mother   . Diabetes Mother   . Colon cancer Neg Hx   . Thyroid disease Neg Hx   . Stomach cancer Neg Hx     Past Surgical History:  Procedure Laterality Date  . CHONDROPLASTY Left 04/01/2020   Procedure: CHONDROPLASTY;  Surgeon: Newt Minion, MD;  Location: [facility];  Service: Orthopedics;  Laterality: Left;  . KNEE ARTHROSCOPY WITH LATERAL MENISECTOMY Left 04/01/2020   Procedure: KNEE ARTHROSCOPY WITH LATERAL MENISECTOMY;  Surgeon: Newt Minion, MD;  Location: [facility];  Service: Orthopedics;  Laterality: Left;  . KNEE ARTHROSCOPY WITH MEDIAL MENISECTOMY Left 04/01/2020   Procedure: LEFT KNEE ARTHROSCOPY  WITH MEDIAL AND  LATERAL MENISECTOMIES, CHONDROPLASTY AND DEBRIDEMENT;  Surgeon: Newt Minion, MD;  Location: [facility];  Service: Orthopedics;  Laterality: Left;  . ORIF FEMUR FRACTURE Right 07/24/2014   Procedure: OPEN REDUCTION INTERNAL FIXATION (ORIF) DISTAL FEMUR FRACTURE;  Surgeon: Newt Minion, MD;  Location: [facility];  Service: Orthopedics;  Laterality: Right;  . ORIF PERIPROSTHETIC FRACTURE Right 07/02/2014   Procedure: Open Reduction Internal Fixation Femur Fracture, Revision Femur Total Hip Arthroplasty;  Surgeon: Newt Minion, MD;  Location: [facility];  Service: Orthopedics;  Laterality: Right;  . ROTATOR CUFF REPAIR  09-2013  . TOTAL HIP ARTHROPLASTY Right 06/19/2014   Procedure: TOTAL HIP ARTHROPLASTY;  Surgeon: Newt Minion, MD;  Location: [facility];  Service: Orthopedics;  Laterality: Right;  Marland Kitchen VAGINAL HYSTERECTOMY  2001   complete   Social History   Occupational History  . Not on file  Tobacco Use  . Smoking status: Former Smoker    Packs/day: 0.50    Years: 30.00    Pack years: 15.00    Types: Cigarettes    Quit date: 11/20/2015    Years since quitting: 4.4  . Smokeless tobacco: Never Used  . Tobacco comment: Patient reports that she is gradually trying to quit  Vaping Use  . Vaping Use: Never used  Substance and Sexual Activity  . Alcohol use: Yes    Alcohol/week: 14.0 standard drinks    Types: 14 Glasses of wine per week  . Drug use: No  . Sexual activity: Not on file

## 2020-05-16 ENCOUNTER — Ambulatory Visit (INDEPENDENT_AMBULATORY_CARE_PROVIDER_SITE_OTHER): Payer: Medicare HMO | Admitting: Orthopedic Surgery

## 2020-05-16 ENCOUNTER — Other Ambulatory Visit: Payer: Self-pay

## 2020-05-16 ENCOUNTER — Encounter: Payer: Self-pay | Admitting: Orthopedic Surgery

## 2020-05-16 VITALS — BP 102/76 | HR 75 | Temp 97.2°F | Resp 18 | Ht 66.0 in | Wt 187.6 lb

## 2020-05-16 DIAGNOSIS — R82998 Other abnormal findings in urine: Secondary | ICD-10-CM

## 2020-05-16 DIAGNOSIS — R110 Nausea: Secondary | ICD-10-CM | POA: Diagnosis not present

## 2020-05-16 LAB — POCT URINALYSIS DIPSTICK
Blood, UA: NEGATIVE
Glucose, UA: NEGATIVE
Ketones, UA: NEGATIVE
Nitrite, UA: NEGATIVE
Protein, UA: POSITIVE — AB
Spec Grav, UA: >=1.03 — AB (ref 1.010–1.025)
Urobilinogen, UA: 1 E.U./dL
pH, UA: 6 (ref 5.0–8.0)

## 2020-05-16 MED ORDER — OMEPRAZOLE 40 MG PO CPDR: 40.0000 mg | DELAYED_RELEASE_CAPSULE | Freq: Every day | ORAL | 3 refills | Status: DC

## 2020-05-16 MED ORDER — ONDANSETRON HCL 4 MG PO TABS: 4.0000 mg | ORAL_TABLET | Freq: Three times a day (TID) | ORAL | 0 refills | Status: DC | PRN

## 2020-05-16 NOTE — Progress Notes (Signed)
Location:   [facility]   [place] [place] Provider:  Windell Moulding, AGNP-C  Eubanks, Carlos American, NP  Patient Care Team: Lauree Chandler, NP as PCP - General (Geriatric Medicine)  Extended Emergency Contact Information Primary Emergency Contact: [facility] Address: [address]          [place], [place] [address] [place] Phone: [phone] Mobile Phone: [phone] Relation: Daughter   Goals of care: Advanced Directive information Advanced Directives 05/16/2020  Does Patient Have a Medical Advance Directive? Yes  Type of Paramedic of [place];Living will  Does patient want to make changes to medical advance directive? No - Patient declined  Copy of [place] in Chart? Yes - validated most recent copy scanned in chart (See row information)  Would patient like information on creating a medical advance directive? -     Chief Complaint  Patient presents with  . Acute Visit    Urine is very dark cloudy, and no pain and nausea    HPI:  Pt is a 75 y.o. female seen today for an acute visit for dark yellow urine.   About 2-3 days ago, she noticed her urine was dark yellow. Last UTI years ago.Thinks she has a UTI. Drinks [employer] (about1/2) every morning and then has about 2-3 glasses water daily. Denies fever, flank pain, dysuria and frequency.   In addition, she has had intermittent nausea in the past 2 weeks. She will eat and then feel nauseous again. Denies abdominal pain. Reports vomiting twice in the past two weeks. Last bowel movement yesterday, goes everyday. Reports stool brown and solid, denies blood. Last colonoscopy 2016.   Past Medical History:  Diagnosis Date  . Arthritis   . Cataract 11/29/2016   rt eye   Dr Clent Jacks  . Hx of adenomatous polyp of colon 06/13/2014  . Hypothyroidism   . Thyroid disease   . Tobacco abuse    Past Surgical History:  Procedure  Laterality Date  . CHONDROPLASTY Left 04/01/2020   Procedure: CHONDROPLASTY;  Surgeon: Newt Minion, MD;  Location: [facility];  Service: Orthopedics;  Laterality: Left;  . KNEE ARTHROSCOPY WITH LATERAL MENISECTOMY Left 04/01/2020   Procedure: KNEE ARTHROSCOPY WITH LATERAL MENISECTOMY;  Surgeon: Newt Minion, MD;  Location: [facility];  Service: Orthopedics;  Laterality: Left;  . KNEE ARTHROSCOPY WITH MEDIAL MENISECTOMY Left 04/01/2020   Procedure: LEFT KNEE ARTHROSCOPY  WITH MEDIAL AND LATERAL MENISECTOMIES, CHONDROPLASTY AND DEBRIDEMENT;  Surgeon: Newt Minion, MD;  Location: [facility];  Service: Orthopedics;  Laterality: Left;  . ORIF FEMUR FRACTURE Right 07/24/2014   Procedure: OPEN REDUCTION INTERNAL FIXATION (ORIF) DISTAL FEMUR FRACTURE;  Surgeon: Newt Minion, MD;  Location: [facility];  Service: Orthopedics;  Laterality: Right;  . ORIF PERIPROSTHETIC FRACTURE Right 07/02/2014   Procedure: Open Reduction Internal Fixation Femur Fracture, Revision Femur Total Hip Arthroplasty;  Surgeon: Newt Minion, MD;  Location: [facility];  Service: Orthopedics;  Laterality: Right;  . ROTATOR CUFF REPAIR  09-2013  . TOTAL HIP ARTHROPLASTY Right 06/19/2014   Procedure: TOTAL HIP ARTHROPLASTY;  Surgeon: Newt Minion, MD;  Location: [facility];  Service: Orthopedics;  Laterality: Right;  Marland Kitchen VAGINAL HYSTERECTOMY  2001   complete    No Known Allergies  Outpatient Encounter Medications as of 05/16/2020  Medication Sig  . Cholecalciferol (VITAMIN D3) 2000 units TABS Take 1 tablet by  mouth daily.  Marland Kitchen estradiol (ESTRACE) 1 MG tablet Take 1 mg by mouth daily.  Marland Kitchen levothyroxine (SYNTHROID) 88 MCG tablet TAKE ONE TABLET BY MOUTH ON EMPTY STOMACH 30 MINUTES BEFORE BREAKFAST FOR THYROID.  . [DISCONTINUED] HYDROcodone-acetaminophen (NORCO/VICODIN) 5-325 MG tablet Take 1 tablet by mouth every 4 (four) hours as needed for moderate pain.   No facility-administered encounter medications  on file as of 05/16/2020.    Review of Systems  Constitutional: Negative for activity change, appetite change and fever.  Respiratory: Negative for cough, shortness of breath and wheezing.   Cardiovascular: Negative for chest pain.  Gastrointestinal: Positive for nausea and vomiting. Negative for abdominal distention, abdominal pain, blood in stool, constipation and diarrhea.  Genitourinary: Negative for dysuria, frequency and hematuria.       Dark yellow urine  Psychiatric/Behavioral: Negative for dysphoric mood. The patient is not nervous/anxious.     Immunization History  Administered Date(s) Administered  . PFIZER(Purple Top)SARS-COV-2 Vaccination 07/10/2019, 07/31/2019  . Zoster Recombinat (Shingrix) 01/11/2020   Pertinent  Health Maintenance Due  Topic Date Due  . COLONOSCOPY (Pts 45-40yrs Insurance coverage will need to be confirmed)  06/07/2019  . PNA vac Low Risk Adult (1 of 2 - PCV13) 05/23/2020 (Originally 02/09/2013)  . MAMMOGRAM  01/28/2021  . DEXA SCAN  Completed  . INFLUENZA VACCINE  Discontinued   Fall Risk  05/16/2020 10/16/2019 10/12/2019 05/24/2019 01/01/2019  Falls in the past year? 0 0 0 0 0  Number falls in past yr: 0 0 0 0 0  Injury with Fall? 0 0 0 0 0   Functional Status Survey:    Vitals:   05/16/20 1011  BP: 102/76  Pulse: 75  Resp: 18  Temp: (!) 97.2 F (36.2 C)  TempSrc: Temporal  SpO2: 97%  Weight: 187 lb 9.6 oz (85.1 kg)  Height: 5\' 6"  (1.676 m)   Body mass index is 30.28 kg/m. Physical Exam Vitals reviewed.  Constitutional:      General: She is not in acute distress. HENT:     Head: Normocephalic.  Cardiovascular:     Rate and Rhythm: Normal rate and regular rhythm.     Pulses: Normal pulses.     Heart sounds: Normal heart sounds. No murmur heard.   Pulmonary:     Effort: Pulmonary effort is normal. No respiratory distress.     Breath sounds: Normal breath sounds. No wheezing.  Abdominal:     General: Bowel sounds are normal.  There is no distension.     Palpations: Abdomen is soft.     Tenderness: There is abdominal tenderness in the epigastric area. There is no guarding.  Skin:    General: Skin is warm and dry.     Capillary Refill: Capillary refill takes less than 2 seconds.  Neurological:     General: No focal deficit present.     Mental Status: She is alert and oriented to person, place, and time.  Psychiatric:        Mood and Affect: Mood normal.        Behavior: Behavior normal.     Labs reviewed: Recent Labs    10/12/19 1035  NA 138  K 4.3  CL 104  CO2 28  GLUCOSE 103*  BUN 11  CREATININE 0.70  CALCIUM 9.2   Recent Labs    10/12/19 1035  AST 13  ALT 12  BILITOT 0.4  PROT 6.5   Recent Labs    10/12/19 1035  WBC 9.7  NEUTROABS  6,645  HGB 13.8  HCT 41.2  MCV 95.6  PLT 275   Lab Results  Component Value Date   TSH 1.39 10/12/2019   No results found for: HGBA1C Lab Results  Component Value Date   CHOL 210 (H) 10/12/2019   HDL 74 10/12/2019   LDLCALC 116 (H) 10/12/2019   TRIG 95 10/12/2019   CHOLHDL 2.8 10/12/2019    Significant Diagnostic Results in last 30 days:  No results found.  Assessment/Plan 1. Nausea - vitals stable, epigastric area tender, regular bowel movements - will try trial of omeprazole - 1-2 weeks - recommend bland diet and avoid fried or spicy foods - advised to see PCP if nausea does not resolve in 1 week  - will consider cbc/diff, bmp, hemoccult, ultrasound and discuss future colonoscopy - ondansetron (ZOFRAN) 4 MG tablet; Take 1 tablet (4 mg total) by mouth every 8 (eight) hours as needed for nausea or vomiting.  Dispense: 20 tablet; Refill: 0 - omeprazole (PRILOSEC) 40 MG capsule; Take 1 capsule (40 mg total) by mouth daily.  Dispense: 30 capsule; Refill: 3 - POC Urinalysis Dipstick  2. Dark urine - UA with trace leukocytes, urine dark yellow, asymptomatic - POC Urinalysis Dipstic - Culture, Urine  I provided 31 minutes of face-to-face  time during this encounter.     Family/ staff Communication: Plan discussed with patient  Labs/tests ordered:  UA and urine culture

## 2020-05-16 NOTE — Patient Instructions (Signed)
Take nausea medicine as needed Please try omeprazole to help reduce stomach acid- try at least for 1 week Eat bland foods for next week - Contact PCP if nausea does not subside in 1 week   Urinary Tract Infection, Adult A urinary tract infection (UTI) is an infection of any part of the urinary tract. The urinary tract includes:  The kidneys.  The ureters.  The bladder.  The urethra. These organs make, store, and get rid of pee (urine) in the body. What are the causes? This infection is caused by germs (bacteria) in your genital area. These germs grow and cause swelling (inflammation) of your urinary tract. What increases the risk? The following factors may make you more likely to develop this condition:  Using a small, thin tube (catheter) to drain pee.  Not being able to control when you pee or poop (incontinence).  Being female. If you are female, these things can increase the risk: ? Using these methods to prevent pregnancy:  A medicine that kills sperm (spermicide).  A device that blocks sperm (diaphragm). ? Having low levels of a female hormone (estrogen). ? Being pregnant. You are more likely to develop this condition if:  You have genes that add to your risk.  You are sexually active.  You take antibiotic medicines.  You have trouble peeing because of: ? A prostate that is bigger than normal, if you are female. ? A blockage in the part of your body that drains pee from the bladder. ? A kidney stone. ? A nerve condition that affects your bladder. ? Not getting enough to drink. ? Not peeing often enough.  You have other conditions, such as: ? Diabetes. ? A weak disease-fighting system (immune system). ? Sickle cell disease. ? Gout. ? Injury of the spine. What are the signs or symptoms? Symptoms of this condition include:  Needing to pee right away.  Peeing small amounts often.  Pain or burning when peeing.  Blood in the pee.  Pee that smells bad  or not like normal.  Trouble peeing.  Pee that is cloudy.  Fluid coming from the vagina, if you are female.  Pain in the belly or lower back. Other symptoms include:  Vomiting.  Not feeling hungry.  Feeling mixed up (confused). This may be the first symptom in older adults.  Being tired and grouchy (irritable).  A fever.  Watery poop (diarrhea). How is this treated?  Taking antibiotic medicine.  Taking other medicines.  Drinking enough water. In some cases, you may need to see a specialist. Follow these instructions at home: Medicines  Take over-the-counter and prescription medicines only as told by your doctor.  If you were prescribed an antibiotic medicine, take it as told by your doctor. Do not stop taking it even if you start to feel better. General instructions  Make sure you: ? Pee until your bladder is empty. ? Do not hold pee for a long time. ? Empty your bladder after sex. ? Wipe from front to back after peeing or pooping if you are a female. Use each tissue one time when you wipe.  Drink enough fluid to keep your pee pale yellow.  Keep all follow-up visits.   Contact a doctor if:  You do not get better after 1-2 days.  Your symptoms go away and then come back. Get help right away if:  You have very bad back pain.  You have very bad pain in your lower belly.  You have a  fever.  You have chills.  You feeling like you will vomit or you vomit. Summary  A urinary tract infection (UTI) is an infection of any part of the urinary tract.  This condition is caused by germs in your genital area.  There are many risk factors for a UTI.  Treatment includes antibiotic medicines.  Drink enough fluid to keep your pee pale yellow. This information is not intended to replace advice given to you by your health care provider. Make sure you discuss any questions you have with your health care provider. Document Revised: 10/19/2019 Document Reviewed:  10/19/2019 Elsevier Patient Education  [place].

## 2020-05-18 LAB — URINE CULTURE
MICRO NUMBER:: 11584349
SPECIMEN QUALITY:: ADEQUATE

## 2020-08-11 ENCOUNTER — Other Ambulatory Visit: Payer: Self-pay | Admitting: Orthopedic Surgery

## 2020-08-11 DIAGNOSIS — R110 Nausea: Secondary | ICD-10-CM

## 2020-08-28 ENCOUNTER — Other Ambulatory Visit: Payer: Self-pay | Admitting: Nurse Practitioner

## 2020-08-28 DIAGNOSIS — E034 Atrophy of thyroid (acquired): Secondary | ICD-10-CM

## 2020-10-13 ENCOUNTER — Ambulatory Visit: Payer: Medicare HMO | Admitting: Nurse Practitioner

## 2020-10-15 ENCOUNTER — Ambulatory Visit: Payer: Medicare HMO | Admitting: Nurse Practitioner

## 2021-01-02 ENCOUNTER — Ambulatory Visit (INDEPENDENT_AMBULATORY_CARE_PROVIDER_SITE_OTHER): Payer: Medicare HMO | Admitting: Family

## 2021-01-02 ENCOUNTER — Encounter: Payer: Self-pay | Admitting: Family

## 2021-01-02 ENCOUNTER — Other Ambulatory Visit: Payer: Self-pay

## 2021-01-02 VITALS — BP 120/80 | HR 66 | Temp 97.0°F | Resp 18 | Ht 66.0 in | Wt 192.0 lb

## 2021-01-02 DIAGNOSIS — H6123 Impacted cerumen, bilateral: Secondary | ICD-10-CM

## 2021-01-02 MED ORDER — DEBROX 6.5 % OT SOLN: 5.0000 [drp] | Freq: Two times a day (BID) | OTIC | 0 refills | Status: AC

## 2021-01-02 NOTE — Patient Instructions (Signed)
-   Instill debrox 6.5 otic solution 5 drops into each ear twice daily x 4 days then follow up for ear lavage.May apply cotton ball at bedtime to prevent drainage to pillow.  

## 2021-01-02 NOTE — Progress Notes (Signed)
Provider: Lajuana Patchell FNP-C  Lauree Chandler, NP  Patient Care Team: Lauree Chandler, NP as PCP - General (Geriatric Medicine)  Extended Emergency Contact Information Primary Emergency Contact: [facility] Address: [address]          [place], [place] [address] [place] Phone: [phone] Mobile Phone: [phone] Relation: Daughter  Code Status: [place] e Goals of care: Advanced Directive information Advanced Directives 05/16/2020  Does Patient Have a Medical Advance Directive? Yes  Type of Paramedic of [place];Living will  Does patient want to make changes to medical advance directive? No - Patient declined  Copy of [place] in Chart? Yes - validated most recent copy scanned in chart (See row information)  Would patient like information on creating a medical advance directive? -     Chief Complaint  Patient presents with   Acute Visit    Patient complains of right ear being stopped up. Patient has been experiencing it for about a week. Tried peroxide to clean it ou, but didn't work. No pain. Patient feels like she is in a "drum"    HPI:  Pt is a 75 y.o. female seen today for an acute visit for evaluation of right ear being stopped up x 1 week.Has used peroxide to try to get wax out but did not work.Has had some ringing in the ear and feeling like she is in " a drum ". No fever or chills.    Past Medical History:  Diagnosis Date   Arthritis    Cataract 11/29/2016   rt eye   Dr Clent Jacks   Hx of adenomatous polyp of colon 06/13/2014   Hypothyroidism    Thyroid disease    Tobacco abuse    Past Surgical History:  Procedure Laterality Date   CHONDROPLASTY Left 04/01/2020   Procedure: CHONDROPLASTY;  Surgeon: Newt Minion, MD;  Location: [facility];  Service: Orthopedics;  Laterality: Left;   KNEE ARTHROSCOPY WITH LATERAL MENISECTOMY Left 04/01/2020   Procedure: KNEE  ARTHROSCOPY WITH LATERAL MENISECTOMY;  Surgeon: Newt Minion, MD;  Location: [facility];  Service: Orthopedics;  Laterality: Left;   KNEE ARTHROSCOPY WITH MEDIAL MENISECTOMY Left 04/01/2020   Procedure: LEFT KNEE ARTHROSCOPY  WITH MEDIAL AND LATERAL MENISECTOMIES, CHONDROPLASTY AND DEBRIDEMENT;  Surgeon: Newt Minion, MD;  Location: [facility];  Service: Orthopedics;  Laterality: Left;   ORIF FEMUR FRACTURE Right 07/24/2014   Procedure: OPEN REDUCTION INTERNAL FIXATION (ORIF) DISTAL FEMUR FRACTURE;  Surgeon: Newt Minion, MD;  Location: [facility];  Service: Orthopedics;  Laterality: Right;   ORIF PERIPROSTHETIC FRACTURE Right 07/02/2014   Procedure: Open Reduction Internal Fixation Femur Fracture, Revision Femur Total Hip Arthroplasty;  Surgeon: Newt Minion, MD;  Location: [facility];  Service: Orthopedics;  Laterality: Right;   ROTATOR CUFF REPAIR  09-2013   TOTAL HIP ARTHROPLASTY Right 06/19/2014   Procedure: TOTAL HIP ARTHROPLASTY;  Surgeon: Newt Minion, MD;  Location: [facility];  Service: Orthopedics;  Laterality: Right;   VAGINAL HYSTERECTOMY  2001   complete    No Known Allergies  Outpatient Encounter Medications as of 01/02/2021  Medication Sig   Cholecalciferol (VITAMIN D3) 2000 units TABS Take 1 tablet by mouth daily.   estradiol (ESTRACE) 1 MG tablet Take 1 mg by mouth daily.   levothyroxine (SYNTHROID) 88 MCG tablet TAKE ONE TABLET BY MOUTH ON EMPTY STOMACH 30 MINUTES BEFORE BREAKFAST FOR THYROID.   [  DISCONTINUED] omeprazole (PRILOSEC) 40 MG capsule Take 1 capsule (40 mg total) by mouth daily.   [DISCONTINUED] ondansetron (ZOFRAN) 4 MG tablet Take 1 tablet (4 mg total) by mouth every 8 (eight) hours as needed for nausea or vomiting.   No facility-administered encounter medications on file as of 01/02/2021.    Review of Systems  Constitutional:  Negative for appetite change, chills, fatigue, fever and unexpected weight change.  HENT:  Positive for tinnitus.  Negative for congestion, dental problem, ear discharge, ear pain, facial swelling, hearing loss, nosebleeds, postnasal drip, rhinorrhea, sinus pressure, sinus pain, sneezing and sore throat.   Eyes:  Negative for pain, discharge, redness, itching and visual disturbance.  Respiratory:  Negative for cough, chest tightness, shortness of breath and wheezing.   Cardiovascular:  Negative for chest pain, palpitations and leg swelling.  Gastrointestinal:  Negative for abdominal distention, abdominal pain, blood in stool, constipation, diarrhea, nausea and vomiting.  Endocrine: Negative for cold intolerance, heat intolerance, polydipsia, polyphagia and polyuria.  Genitourinary:  Negative for difficulty urinating, dysuria, flank pain, frequency and urgency.  Musculoskeletal:  Negative for arthralgias, back pain, gait problem, joint swelling, myalgias, neck pain and neck stiffness.  Skin:  Negative for color change, pallor, rash and wound.  Neurological:  Negative for dizziness, syncope, speech difficulty, weakness, light-headedness, numbness and headaches.  Hematological:  Does not bruise/bleed easily.  Psychiatric/Behavioral:  Negative for agitation, behavioral problems, confusion, hallucinations, self-injury, sleep disturbance and suicidal ideas. The patient is not nervous/anxious.    Immunization History  Administered Date(s) Administered   PFIZER(Purple Top)SARS-COV-2 Vaccination 07/10/2019, 07/31/2019   Zoster Recombinat (Shingrix) 01/11/2020, 09/05/2020   Pertinent  Health Maintenance Due  Topic Date Due   COLONOSCOPY (Pts 45-55yrs Insurance coverage will need to be confirmed)  06/07/2019   MAMMOGRAM  01/28/2021   DEXA SCAN  Completed   INFLUENZA VACCINE  Discontinued   Fall Risk  05/16/2020 10/16/2019 10/12/2019 05/24/2019 01/01/2019  Falls in the past year? 0 0 0 0 0  Number falls in past yr: 0 0 0 0 0  Injury with Fall? 0 0 0 0 0   Functional Status Survey:    Vitals:   01/02/21 1509   BP: 120/80  Pulse: 66  Resp: 18  Temp: (!) 97 F (36.1 C)  SpO2: 97%  Weight: 192 lb (87.1 kg)  Height: 5\' 6"  (1.676 m)   Body mass index is 30.99 kg/m. Physical Exam Vitals reviewed.  Constitutional:      General: She is not in acute distress.    Appearance: Normal appearance. She is normal weight. She is not ill-appearing or diaphoretic.  HENT:     Head: Normocephalic.     Right Ear: There is impacted cerumen.     Left Ear: There is impacted cerumen.     Ears:     Comments: Unable to lavage due to hard cerumen impaction     Nose: Nose normal. No congestion or rhinorrhea.     Mouth/Throat:     Mouth: Mucous membranes are moist.     Pharynx: Oropharynx is clear. No oropharyngeal exudate or posterior oropharyngeal erythema.  Eyes:     General: No scleral icterus.       Right eye: No discharge.        Left eye: No discharge.     Conjunctiva/sclera: Conjunctivae normal.     Pupils: Pupils are equal, round, and reactive to light.  Cardiovascular:     Rate and Rhythm: Normal rate and regular rhythm.  Pulses: Normal pulses.     Heart sounds: Normal heart sounds. No murmur heard.   No friction rub. No gallop.  Pulmonary:     Effort: Pulmonary effort is normal. No respiratory distress.     Breath sounds: Normal breath sounds. No wheezing, rhonchi or rales.  Chest:     Chest wall: No tenderness.  Musculoskeletal:     Cervical back: Normal range of motion. No rigidity or tenderness.  Lymphadenopathy:     Cervical: No cervical adenopathy.  Skin:    General: Skin is warm and dry.     Coloration: Skin is not pale.     Findings: No bruising, erythema, lesion or rash.  Neurological:     Mental Status: She is alert and oriented to person, place, and time.     Motor: No weakness.     Gait: Gait normal.  Psychiatric:        Mood and Affect: Mood normal.        Speech: Speech normal.        Behavior: Behavior normal.        Thought Content: Thought content normal.         Judgment: Judgment normal.    Labs reviewed: No results for input(s): NA, K, CL, CO2, GLUCOSE, BUN, CREATININE, CALCIUM, MG, PHOS in the last 8760 hours. No results for input(s): AST, ALT, ALKPHOS, BILITOT, PROT, ALBUMIN in the last 8760 hours. No results for input(s): WBC, NEUTROABS, HGB, HCT, MCV, PLT in the last 8760 hours. Lab Results  Component Value Date   TSH 1.39 10/12/2019   No results found for: HGBA1C Lab Results  Component Value Date   CHOL 210 (H) 10/12/2019   HDL 74 10/12/2019   LDLCALC 116 (H) 10/12/2019   TRIG 95 10/12/2019   CHOLHDL 2.8 10/12/2019    Significant Diagnostic Results in last 30 days:  No results found.  Assessment/Plan   Bilateral impacted cerumen - Advised to Instill debrox 6.5 otic solution 5 drops into each ear twice daily x 4 days then follow up for ear lavage.May apply cotton ball at bedtime to prevent drainage to pillow. - carbamide peroxide (DEBROX) 6.5 % OTIC solution; Place 5 drops into both ears 2 (two) times daily for 4 days.  Dispense: 15 mL; Refill: 0  Family/ staff Communication: Reviewed plan of care with patient verbalized understanding   Labs/tests ordered: None   Next Appointment: 5 days for bilateral ear lavage   Sandrea Hughs, NP

## 2021-01-08 ENCOUNTER — Ambulatory Visit: Payer: Medicare HMO | Admitting: Family

## 2021-01-16 ENCOUNTER — Telehealth: Payer: Self-pay | Admitting: Nurse Practitioner

## 2021-01-16 NOTE — Telephone Encounter (Signed)
I called patient to schedule a follow up appt with Dewaine Oats, she has not seen her since 10/16/2019, has seen Dinah & Amy since then but needs a follow up with Dewaine Oats, can be pushed out to February-March.

## 2021-01-29 ENCOUNTER — Telehealth: Payer: Self-pay

## 2021-01-29 NOTE — Telephone Encounter (Signed)
Tried calling patient to schedule routine follow up appointment as well as AWV with Janett Billow for December. Left message for patient to call office to schedule.

## 2021-02-11 ENCOUNTER — Telehealth: Payer: Self-pay

## 2021-02-11 NOTE — Telephone Encounter (Signed)
Called patient to schedule routine follow up and AWV. Left message for patient to call office to schedule.

## 2021-02-23 DIAGNOSIS — Z6831 Body mass index (BMI) 31.0-31.9, adult: Secondary | ICD-10-CM | POA: Diagnosis not present

## 2021-02-23 DIAGNOSIS — Z1231 Encounter for screening mammogram for malignant neoplasm of breast: Secondary | ICD-10-CM | POA: Diagnosis not present

## 2021-02-23 DIAGNOSIS — Z124 Encounter for screening for malignant neoplasm of cervix: Secondary | ICD-10-CM | POA: Diagnosis not present

## 2021-02-23 LAB — HM MAMMOGRAPHY

## 2021-03-20 ENCOUNTER — Other Ambulatory Visit: Payer: Self-pay | Admitting: Nurse Practitioner

## 2021-03-20 DIAGNOSIS — E034 Atrophy of thyroid (acquired): Secondary | ICD-10-CM

## 2021-04-07 ENCOUNTER — Encounter: Payer: Self-pay | Admitting: Physician Assistant

## 2021-04-07 ENCOUNTER — Ambulatory Visit (INDEPENDENT_AMBULATORY_CARE_PROVIDER_SITE_OTHER): Payer: Medicare HMO | Admitting: Physician Assistant

## 2021-04-07 ENCOUNTER — Ambulatory Visit: Payer: Self-pay

## 2021-04-07 DIAGNOSIS — M1712 Unilateral primary osteoarthritis, left knee: Secondary | ICD-10-CM

## 2021-04-07 MED ORDER — LIDOCAINE HCL 1 % IJ SOLN
2.0000 mL | INTRAMUSCULAR | Status: AC | PRN
  Administered 2021-04-07: 2 mL

## 2021-04-07 MED ORDER — METHYLPREDNISOLONE ACETATE 40 MG/ML IJ SUSP
80.0000 mg | INTRAMUSCULAR | Status: AC | PRN
  Administered 2021-04-07: 80 mg via INTRA_ARTICULAR

## 2021-04-07 NOTE — Progress Notes (Signed)
Office Visit Note   Patient: Bianca Collins           Date of Birth: March 05, 1946           MRN: [id number] Visit Date: 04/07/2021              Requested by: Lauree Chandler, NP [place],  [place] [id number] PCP: Lauree Chandler, NP  Chief Complaint  Patient presents with   Left Knee - Pain, Follow-up      HPI: Patient is a pleasant 76 year old woman who is 1 year status post left knee arthroscopy.  She says she did not get a lot of relief from the surgery.  She had a meniscectomy and chondroplasty.  She denies any recent injuries.  She does notice that she is having more pain especially on the inside of her knee.  Denies any locking or catching.  She does feel stiff when she sits for a while.  She has had injections in the past and was wondering if she could have 1 today  Assessment & Plan: Visit Diagnoses:  1. Primary osteoarthritis of left knee     Plan: When I compare the radiographs from her previous she does have some progression of the medial sided arthritis.  Talked her about the natural history of this and options.  She does workout at the gym she try to get her leg stronger.  With the pain is a new since she does not feel she is ready for any surgical discussions.  I agree.  We will start with another steroid injection.  She is going to give this a week if not she can call and we can try authorizing viscosupplementation  Follow-Up Instructions: No follow-ups on file.   Ortho Exam  Patient is alert, oriented, no adenopathy, well-dressed, normal affect, normal respiratory effort. Left knee no effusion no redness no cellulitis well-healed arthroscopy portals.  She has tenderness over the medial greater than the lateral joint line compartments are soft  Imaging: No results found. No images are attached to the encounter.  Labs: No results found for: HGBA1C, ESRSEDRATE, CRP, LABURIC, REPTSTATUS, GRAMSTAIN, CULT, LABORGA   Lab Results  Component Value  Date   ALBUMIN 4.0 05/28/2015   ALBUMIN 4.0 06/10/2014    No results found for: MG No results found for: VD25OH  No results found for: PREALBUMIN CBC EXTENDED Latest Ref Rng & Units 10/12/2019 03/21/2018 05/28/2015  WBC 3.8 - 10.8 Thousand/uL 9.7 8.9 9.0  RBC 3.80 - 5.10 Million/uL 4.31 4.21 4.10  HGB 11.7 - 15.5 g/dL 13.8 13.6 13.3  HCT 35.0 - 45.0 % 41.2 39.7 39.8  PLT 140 - 400 Thousand/uL 275 275 329  NEUTROABS 1,500 - 7,800 cells/uL 6,645 5,135 5.4  LYMPHSABS 850 - 3,900 cells/uL 2,018 2,510 2.6     There is no height or weight on file to calculate BMI.  Orders:  Orders Placed This Encounter  Procedures   XR Knee 1-2 Views Left   No orders of the defined types were placed in this encounter.    Procedures: Large Joint Inj on 04/07/2021 10:45 AM Indications: pain and diagnostic evaluation Details: 25 G 1.5 in needle, anteromedial approach  Arthrogram: No  Medications: 80 mg methylPREDNISolone acetate 40 MG/ML; 2 mL lidocaine 1 % Outcome: tolerated well, no immediate complications Procedure, treatment alternatives, risks and benefits explained, specific risks discussed. Consent was given by the patient.     Clinical Data: No additional findings.  ROS:  All other systems negative, except as noted in the HPI. Review of Systems  Objective: Vital Signs: There were no vitals taken for this visit.  Specialty Comments:  No specialty comments available.  PMFS History: Patient Active Problem List   Diagnosis Date Noted   Osteochondral defect of femoral condyle    Old complex tear of lateral meniscus of left knee    Old complex tear of medial meniscus of left knee    Aortic atherosclerosis ([place]) 01/12/2019   Femur fracture, right ([place]) 07/25/2014   Closed femur fracture ([place]) 07/24/2014   Periprosthetic fracture around internal prosthetic right hip joint ([place]) 07/05/2014   Periprosth fracture around internal prosth r hip jt, sequela 07/02/2014   H/O total hip  arthroplasty 06/19/2014   Hx of adenomatous polyp of colon 06/13/2014   Hypothyroidism 06/06/2014   Past Medical History:  Diagnosis Date   Arthritis    Cataract 11/29/2016   rt eye   Dr Clent Jacks   Hx of adenomatous polyp of colon 06/13/2014   Hypothyroidism    Thyroid disease    Tobacco abuse     Family History  Problem Relation Age of Onset   Diabetes type II Mother    Hypertension Mother    Diabetes Mother    Colon cancer Neg Hx    Thyroid disease Neg Hx    Stomach cancer Neg Hx     Past Surgical History:  Procedure Laterality Date   CHONDROPLASTY Left 04/01/2020   Procedure: CHONDROPLASTY;  Surgeon: Newt Minion, MD;  Location: [facility];  Service: Orthopedics;  Laterality: Left;   KNEE ARTHROSCOPY WITH LATERAL MENISECTOMY Left 04/01/2020   Procedure: KNEE ARTHROSCOPY WITH LATERAL MENISECTOMY;  Surgeon: Newt Minion, MD;  Location: [facility];  Service: Orthopedics;  Laterality: Left;   KNEE ARTHROSCOPY WITH MEDIAL MENISECTOMY Left 04/01/2020   Procedure: LEFT KNEE ARTHROSCOPY  WITH MEDIAL AND LATERAL MENISECTOMIES, CHONDROPLASTY AND DEBRIDEMENT;  Surgeon: Newt Minion, MD;  Location: [facility];  Service: Orthopedics;  Laterality: Left;   ORIF FEMUR FRACTURE Right 07/24/2014   Procedure: OPEN REDUCTION INTERNAL FIXATION (ORIF) DISTAL FEMUR FRACTURE;  Surgeon: Newt Minion, MD;  Location: [facility];  Service: Orthopedics;  Laterality: Right;   ORIF PERIPROSTHETIC FRACTURE Right 07/02/2014   Procedure: Open Reduction Internal Fixation Femur Fracture, Revision Femur Total Hip Arthroplasty;  Surgeon: Newt Minion, MD;  Location: [facility];  Service: Orthopedics;  Laterality: Right;   ROTATOR CUFF REPAIR  09-2013   TOTAL HIP ARTHROPLASTY Right 06/19/2014   Procedure: TOTAL HIP ARTHROPLASTY;  Surgeon: Newt Minion, MD;  Location: [facility];  Service: Orthopedics;  Laterality: Right;   VAGINAL HYSTERECTOMY  2001   complete   Social History    Occupational History   Not on file  Tobacco Use   Smoking status: Former    Packs/day: 0.50    Years: 30.00    Pack years: 15.00    Types: Cigarettes    Quit date: 11/20/2015    Years since quitting: 5.3   Smokeless tobacco: Never   Tobacco comments:    Patient reports that she is gradually trying to quit  Vaping Use   Vaping Use: Never used  Substance and Sexual Activity   Alcohol use: Yes    Alcohol/week: 14.0 standard drinks    Types: 14 Glasses of wine per week   Drug use: No   Sexual activity: Not Currently

## 2021-04-13 DIAGNOSIS — L820 Inflamed seborrheic keratosis: Secondary | ICD-10-CM | POA: Diagnosis not present

## 2021-05-11 ENCOUNTER — Other Ambulatory Visit: Payer: Self-pay | Admitting: Nurse Practitioner

## 2021-05-11 DIAGNOSIS — E034 Atrophy of thyroid (acquired): Secondary | ICD-10-CM

## 2021-05-25 ENCOUNTER — Ambulatory Visit (INDEPENDENT_AMBULATORY_CARE_PROVIDER_SITE_OTHER): Payer: Medicare HMO | Admitting: Nurse Practitioner

## 2021-05-25 ENCOUNTER — Encounter: Payer: Self-pay | Admitting: Nurse Practitioner

## 2021-05-25 ENCOUNTER — Other Ambulatory Visit: Payer: Self-pay

## 2021-05-25 VITALS — BP 118/76 | HR 56 | Temp 97.3°F | Ht 66.0 in | Wt 196.0 lb

## 2021-05-25 DIAGNOSIS — E785 Hyperlipidemia, unspecified: Secondary | ICD-10-CM

## 2021-05-25 DIAGNOSIS — E6609 Other obesity due to excess calories: Secondary | ICD-10-CM

## 2021-05-25 DIAGNOSIS — H6122 Impacted cerumen, left ear: Secondary | ICD-10-CM

## 2021-05-25 DIAGNOSIS — Z6831 Body mass index (BMI) 31.0-31.9, adult: Secondary | ICD-10-CM

## 2021-05-25 DIAGNOSIS — M1712 Unilateral primary osteoarthritis, left knee: Secondary | ICD-10-CM | POA: Diagnosis not present

## 2021-05-25 DIAGNOSIS — E034 Atrophy of thyroid (acquired): Secondary | ICD-10-CM

## 2021-05-25 DIAGNOSIS — Z87891 Personal history of nicotine dependence: Secondary | ICD-10-CM | POA: Diagnosis not present

## 2021-05-25 DIAGNOSIS — I700 Atherosclerosis of aorta: Secondary | ICD-10-CM | POA: Diagnosis not present

## 2021-05-25 MED ORDER — ASPIRIN 81 MG PO TBEC: 81.0000 mg | DELAYED_RELEASE_TABLET | Freq: Every day | ORAL | 12 refills | Status: DC

## 2021-05-25 NOTE — Progress Notes (Signed)
? ? ?Careteam: ?Patient Care Team: ?Lauree Chandler, NP as PCP - General (Geriatric Medicine) ? ?PLACE OF SERVICE:  ?[facility]  ?Advanced Directive information ?Does Patient Have a Medical Advance Directive?: Yes, Type of Advance Directive: [place], Does patient want to make changes to medical advance directive?: No - Patient declined ? ?No Known Allergies ? ?Chief Complaint  ?Patient presents with  ? Acute Visit  ?  Discuss medications   ? ? ? ?HPI: Patient is a 76 y.o. female for routine follow up.  ?She has not had routine follow up in over a year. Needing medication refilled.  ?In January 2022 she had left knee arthroscopy due to meniscus tear, ongoing arthritis. Had injection in January and doing well.  ? ?Aortic atherosclerosis noted on CT 2020.  ? ?She has been doing better on eating, more protein and vegetables. Working on meal planning.  ?She is planning on exercising more.  ? ?She has kittens- getting the last of the fixed.  ? ?She is up to date on mammogram ? ?Reports next colonoscopy was not due until 2025 per GI ? ?Review of Systems:  ?Review of Systems  ?Constitutional:  Negative for chills, fever and weight loss.  ?HENT:  Negative for tinnitus.   ?Respiratory:  Negative for cough, sputum production and shortness of breath.   ?Cardiovascular:  Negative for chest pain, palpitations and leg swelling.  ?Gastrointestinal:  Negative for abdominal pain, constipation, diarrhea and heartburn.  ?Genitourinary:  Negative for dysuria, frequency and urgency.  ?Musculoskeletal:  Negative for back pain, falls, joint pain and myalgias.  ?Skin: Negative.   ?Neurological:  Negative for dizziness and headaches.  ?Psychiatric/Behavioral:  Negative for depression and memory loss. The patient does not have insomnia.   ? ?Past Medical History:  ?Diagnosis Date  ? Arthritis   ? Cataract 11/29/2016  ? rt eye   Dr Clent Jacks  ? Hx of adenomatous polyp of colon 06/13/2014  ? Hypothyroidism   ? Thyroid  disease   ? Tobacco abuse   ? ?Past Surgical History:  ?Procedure Laterality Date  ? CHONDROPLASTY Left 04/01/2020  ? Procedure: CHONDROPLASTY;  Surgeon: Newt Minion, MD;  Location: [facility];  Service: Orthopedics;  Laterality: Left;  ? KNEE ARTHROSCOPY WITH LATERAL MENISECTOMY Left 04/01/2020  ? Procedure: KNEE ARTHROSCOPY WITH LATERAL MENISECTOMY;  Surgeon: Newt Minion, MD;  Location: [facility];  Service: Orthopedics;  Laterality: Left;  ? KNEE ARTHROSCOPY WITH MEDIAL MENISECTOMY Left 04/01/2020  ? Procedure: LEFT KNEE ARTHROSCOPY  WITH MEDIAL AND LATERAL MENISECTOMIES, CHONDROPLASTY AND DEBRIDEMENT;  Surgeon: Newt Minion, MD;  Location: [facility];  Service: Orthopedics;  Laterality: Left;  ? ORIF FEMUR FRACTURE Right 07/24/2014  ? Procedure: OPEN REDUCTION INTERNAL FIXATION (ORIF) DISTAL FEMUR FRACTURE;  Surgeon: Newt Minion, MD;  Location: [facility];  Service: Orthopedics;  Laterality: Right;  ? ORIF PERIPROSTHETIC FRACTURE Right 07/02/2014  ? Procedure: Open Reduction Internal Fixation Femur Fracture, Revision Femur Total Hip Arthroplasty;  Surgeon: Newt Minion, MD;  Location: [facility];  Service: Orthopedics;  Laterality: Right;  ? ROTATOR CUFF REPAIR  09-2013  ? TOTAL HIP ARTHROPLASTY Right 06/19/2014  ? Procedure: TOTAL HIP ARTHROPLASTY;  Surgeon: Newt Minion, MD;  Location: [facility];  Service: Orthopedics;  Laterality: Right;  ? VAGINAL HYSTERECTOMY  2001  ? complete  ? ?Social History: ?  reports that she quit smoking about 5 years ago. Her smoking use included cigarettes.  She has a 15.00 pack-year smoking history. She has never used smokeless tobacco. She reports current alcohol use of about 14.0 standard drinks per week. She reports that she does not use drugs. ? ?Family History  ?Problem Relation Age of Onset  ? Diabetes type II Mother   ? Hypertension Mother   ? Diabetes Mother   ? Colon cancer Neg Hx   ? Thyroid disease Neg Hx   ? Stomach cancer Neg Hx    ? ? ?Medications: ?Patient's Medications  ?New Prescriptions  ? No medications on file  ?Previous Medications  ? CHOLECALCIFEROL (VITAMIN D3) 2000 UNITS TABS    Take 1 tablet by mouth daily.  ? ESTRADIOL (ESTRACE) 1 MG TABLET    Take 1 mg by mouth daily.  ? LEVOTHYROXINE (SYNTHROID) 88 MCG TABLET    Take one tablet by mouth on empty stomach 30 minutes before breakfast for thyroid. ? ?PATIENT NEEDS TO COME INTO APPOINTMENT!!!  ? MULTIPLE VITAMIN (MULTIVITAMIN) TABLET    Take 1 tablet by mouth daily.  ?Modified Medications  ? No medications on file  ?Discontinued Medications  ? No medications on file  ? ? ?Physical Exam: ? ?Vitals:  ? 05/25/21 1034  ?BP: 118/76  ?Pulse: (!) 56  ?Temp: (!) 97.3 ?F (36.3 ?C)  ?TempSrc: Temporal  ?SpO2: 97%  ?Weight: 196 lb (88.9 kg)  ?Height: 5' 6"  (1.676 m)  ? ?Body mass index is 31.64 kg/m?. ?Wt Readings from Last 3 Encounters:  ?05/25/21 196 lb (88.9 kg)  ?01/02/21 192 lb (87.1 kg)  ?05/16/20 187 lb 9.6 oz (85.1 kg)  ? ? ?Physical Exam ?Constitutional:   ?   General: She is not in acute distress. ?   Appearance: She is well-developed. She is not diaphoretic.  ?HENT:  ?   Head: Normocephalic and atraumatic.  ?   Left Ear: There is impacted cerumen.  ?   Mouth/Throat:  ?   Pharynx: No oropharyngeal exudate.  ?Eyes:  ?   Conjunctiva/sclera: Conjunctivae normal.  ?   Pupils: Pupils are equal, round, and reactive to light.  ?Cardiovascular:  ?   Rate and Rhythm: Normal rate and regular rhythm.  ?   Heart sounds: Normal heart sounds.  ?Pulmonary:  ?   Effort: Pulmonary effort is normal.  ?   Breath sounds: Normal breath sounds.  ?Abdominal:  ?   General: Bowel sounds are normal.  ?   Palpations: Abdomen is soft.  ?Musculoskeletal:  ?   Cervical back: Normal range of motion and neck supple.  ?   Right lower leg: No edema.  ?   Left lower leg: No edema.  ?Skin: ?   General: Skin is warm and dry.  ?Neurological:  ?   Mental Status: She is alert.  ?Psychiatric:     ?   Mood and Affect: Mood  normal.  ? ? ?Labs reviewed: ?Basic Metabolic Panel: ?No results for input(s): NA, K, CL, CO2, GLUCOSE, BUN, CREATININE, CALCIUM, MG, PHOS, TSH in the last 8760 hours. ?Liver Function Tests: ?No results for input(s): AST, ALT, ALKPHOS, BILITOT, PROT, ALBUMIN in the last 8760 hours. ?No results for input(s): LIPASE, AMYLASE in the last 8760 hours. ?No results for input(s): AMMONIA in the last 8760 hours. ?CBC: ?No results for input(s): WBC, NEUTROABS, HGB, HCT, MCV, PLT in the last 8760 hours. ?Lipid Panel: ?No results for input(s): CHOL, HDL, LDLCALC, TRIG, CHOLHDL, LDLDIRECT in the last 8760 hours. ?TSH: ?No results for input(s): TSH in the last 8760 hours. ?  A1C: ?No results found for: HGBA1C ? ? ?Assessment/Plan ?1. Aortic atherosclerosis (HCC) ?- CBC with Differential/Platelet ?- aspirin 81 MG EC tablet; Take 1 tablet (81 mg total) by mouth daily. Swallow whole.  Dispense: 30 tablet; Refill: 12 ?-discussed adding statin based on cholesterol ? ?2. Hypothyroidism due to acquired atrophy of thyroid ?Continues on synthroid 88 mcg ?- TSH ? ?3. Hyperlipidemia, unspecified hyperlipidemia type ?-dietary modifications encouraged.  ?- Lipid panel ?- CMP with eGFR([facility]) ? ?4. Primary osteoarthritis of left knee ?-recent injection which has been beneficial.  ? ?5. History of smoking ?- CT CHEST LUNG CA SCREEN LOW DOSE W/O CM; Future ? ?6. Class 1 obesity due to excess calories without serious comorbidity with body mass index (BMI) of 31.0 to 31.9 in adult ?-education provided on healthy weight loss through increase in physical activity and proper nutrition. She has recently started working on nutrition.  ? ?7. Impacted cerumen of left ear ?-reports she will use home remedies to clear.  ? ? ?Return in about 6 months (around 11/25/2021) for routine follow up . ?Carlos American. Dewaine Oats, AGNP ? ?[facility] ?[phone]  ?

## 2021-05-25 NOTE — Patient Instructions (Addendum)
Please sign a record release for Dr Nori Riis- physician for women  on check out. ?Please schedule AWV_ telephone   ? ?To start ASA EC 81 mg daily  ? ?Can get COVID booster at local pharmacy  ? ?

## 2021-05-26 LAB — COMPLETE METABOLIC PANEL WITH GFR
AG Ratio: 1.6 (calc) (ref 1.0–2.5)
ALT: 12 U/L (ref 6–29)
AST: 11 U/L (ref 10–35)
Albumin: 3.9 g/dL (ref 3.6–5.1)
Alkaline phosphatase (APISO): 48 U/L (ref 37–153)
BUN: 13 mg/dL (ref 7–25)
CO2: 29 mmol/L (ref 20–32)
Calcium: 9.4 mg/dL (ref 8.6–10.4)
Chloride: 105 mmol/L (ref 98–110)
Creat: 0.69 mg/dL (ref 0.60–1.00)
Globulin: 2.5 g/dL (calc) (ref 1.9–3.7)
Glucose, Bld: 105 mg/dL (ref 65–139)
Potassium: 4.6 mmol/L (ref 3.5–5.3)
Sodium: 139 mmol/L (ref 135–146)
Total Bilirubin: 0.3 mg/dL (ref 0.2–1.2)
Total Protein: 6.4 g/dL (ref 6.1–8.1)
eGFR: 92 mL/min/{1.73_m2} (ref >=60)

## 2021-05-26 LAB — LIPID PANEL
Cholesterol: 184 mg/dL (ref <200)
HDL: 65 mg/dL (ref >=50)
LDL Cholesterol (Calc): 97 mg/dL (calc)
Non-HDL Cholesterol (Calc): 119 mg/dL (calc) (ref <130)
Total CHOL/HDL Ratio: 2.8 (calc) (ref <5.0)
Triglycerides: 122 mg/dL (ref <150)

## 2021-05-26 LAB — CBC WITH DIFFERENTIAL/PLATELET
Absolute Monocytes: 865 cells/uL (ref 200–950)
Basophils Absolute: 73 cells/uL (ref 0–200)
Basophils Relative: 0.8 %
Eosinophils Absolute: 137 cells/uL (ref 15–500)
Eosinophils Relative: 1.5 %
HCT: 40.1 % (ref 35.0–45.0)
Hemoglobin: 13.4 g/dL (ref 11.7–15.5)
Lymphs Abs: 2348 cells/uL (ref 850–3900)
MCH: 31.9 pg (ref 27.0–33.0)
MCHC: 33.4 g/dL (ref 32.0–36.0)
MCV: 95.5 fL (ref 80.0–100.0)
MPV: 10.8 fL (ref 7.5–12.5)
Monocytes Relative: 9.5 %
Neutro Abs: 5678 cells/uL (ref 1500–7800)
Neutrophils Relative %: 62.4 %
Platelets: 256 10*3/uL (ref 140–400)
RBC: 4.2 10*6/uL (ref 3.80–5.10)
RDW: 12.1 % (ref 11.0–15.0)
Total Lymphocyte: 25.8 %
WBC: 9.1 10*3/uL (ref 3.8–10.8)

## 2021-05-26 LAB — TSH: TSH: 0.8 mIU/L (ref 0.40–4.50)

## 2021-05-30 ENCOUNTER — Other Ambulatory Visit: Payer: Self-pay | Admitting: Nurse Practitioner

## 2021-05-30 DIAGNOSIS — E034 Atrophy of thyroid (acquired): Secondary | ICD-10-CM

## 2021-06-01 ENCOUNTER — Other Ambulatory Visit: Payer: Medicare HMO

## 2021-06-15 ENCOUNTER — Other Ambulatory Visit: Payer: Self-pay

## 2021-06-15 ENCOUNTER — Ambulatory Visit
Admission: RE | Admit: 2021-06-15 | Discharge: 2021-06-15 | Disposition: A | Discharge status: Home / Self Care | Payer: Medicare HMO | Source: Ambulatory Visit | Attending: Nurse Practitioner | Admitting: Nurse Practitioner

## 2021-06-15 DIAGNOSIS — Z87891 Personal history of nicotine dependence: Secondary | ICD-10-CM

## 2021-08-10 ENCOUNTER — Encounter: Payer: Self-pay | Admitting: Internal Medicine

## 2021-08-20 DIAGNOSIS — H26491 Other secondary cataract, right eye: Secondary | ICD-10-CM | POA: Diagnosis not present

## 2021-08-20 DIAGNOSIS — Z961 Presence of intraocular lens: Secondary | ICD-10-CM | POA: Diagnosis not present

## 2021-08-20 DIAGNOSIS — H02831 Dermatochalasis of right upper eyelid: Secondary | ICD-10-CM | POA: Diagnosis not present

## 2021-08-20 DIAGNOSIS — H1045 Other chronic allergic conjunctivitis: Secondary | ICD-10-CM | POA: Diagnosis not present

## 2021-08-20 DIAGNOSIS — H02834 Dermatochalasis of left upper eyelid: Secondary | ICD-10-CM | POA: Diagnosis not present

## 2021-08-20 DIAGNOSIS — H04123 Dry eye syndrome of bilateral lacrimal glands: Secondary | ICD-10-CM | POA: Diagnosis not present

## 2021-08-25 ENCOUNTER — Ambulatory Visit (INDEPENDENT_AMBULATORY_CARE_PROVIDER_SITE_OTHER): Payer: Medicare HMO

## 2021-08-25 ENCOUNTER — Ambulatory Visit (INDEPENDENT_AMBULATORY_CARE_PROVIDER_SITE_OTHER): Payer: Medicare HMO | Admitting: Orthopedic Surgery

## 2021-08-25 ENCOUNTER — Encounter: Payer: Self-pay | Admitting: Orthopedic Surgery

## 2021-08-25 VITALS — BP 126/71 | HR 63 | Ht 66.0 in | Wt 196.0 lb

## 2021-08-25 DIAGNOSIS — M79641 Pain in right hand: Secondary | ICD-10-CM

## 2021-08-25 NOTE — Progress Notes (Signed)
Office Visit Note   Patient: Bianca Collins           Date of Birth: [birth year]           MRN: [id number] Visit Date: 08/25/2021              Requested by: Lauree Chandler, NP [place],  [place] [id number] PCP: Lauree Chandler, NP   Assessment & Plan: Visit Diagnoses:  1. Pain in right hand     Plan: Discussed with patient that I do not have a great idea of why her hand is painful.  She has no evidence of collateral ligament instability at the index finger.  Her x-rays largely unremarkable with minimal degenerative changes present at this finger.  She has an intact EDC and EIP tendons with no tendon subluxation suggestive of a sagittal band injury.  She has no pain today.  She will keep an eye on her hand and will take an anti-inflammatory medication as needed.  She will come back if her symptoms worsen.  Follow-Up Instructions: No follow-ups on file.   Orders:  Orders Placed This Encounter  Procedures   XR Hand Complete Right   No orders of the defined types were placed in this encounter.     Procedures: No procedures performed   Clinical Data: No additional findings.   Subjective: Chief Complaint  Patient presents with   Right Hand - Pain    RIGHT Handed, Pain:7/10 when its hurting, onset x 2 months, she was helping someone in the car and something in her hand popped along the index finger, now she is not able to lift anything heavy with this hand    This is a 76 year old right-hand-dominant female presents with pain at the dorsal aspect of the index finger.  2 months ago she was helping her friend in the car and gave him a push.  She felt something pop at the dorsal aspect of the index finger at the MCP joint.  Since that time she had intermittent pain in the hand that is mostly when she lifts heavy objects.  He said initially she had significant swelling and pain over the index MCP joint but this is resolved.  She has no pain today.  The example she  gives is if she tries to lift a heavy pot she is unable to do it with just one hand anymore and has to use both hands.  This is secondary to weakness and pain at the index MCP joint.  She has good days and bad days.  She has not taken anything for pain at the moment.  She denies any subjective instability at the joint.  She denies having any pain in the joint prior to this event.   Review of Systems   Objective: Vital Signs: BP 126/71 (BP Location: Left Arm, Patient Position: Sitting)   Pulse 63   Ht '5\' 6"'$  (1.676 m)   Wt 196 lb (88.9 kg)   BMI 31.64 kg/m   Physical Exam Constitutional:      Appearance: Normal appearance.  Cardiovascular:     Rate and Rhythm: Normal rate.     Pulses: Normal pulses.  Pulmonary:     Effort: Pulmonary effort is normal.  Skin:    General: Skin is warm and dry.     Capillary Refill: Capillary refill takes less than 2 seconds.  Neurological:     Mental Status: She is alert.    Right Hand Exam  Tenderness  The patient is experiencing no tenderness.   Other  Erythema: absent Sensation: normal Pulse: present  Comments:  No swelling at index MCJP joint.  Able to fully extend finger against resistance without extensor tendon subluxation.  No radial or ulnar collateral ligament instability at index finger MCP joint. Full and painless ROM of index finger.      Specialty Comments:  No specialty comments available.  Imaging: No results found.   PMFS History: Patient Active Problem List   Diagnosis Date Noted   Osteochondral defect of femoral condyle    Old complex tear of lateral meniscus of left knee    Old complex tear of medial meniscus of left knee    Aortic atherosclerosis ([place]) 01/12/2019   Femur fracture, right ([place]) 07/25/2014   Closed femur fracture ([place]) 07/24/2014   Periprosthetic fracture around internal prosthetic right hip joint ([place]) 07/05/2014   Periprosth fracture around internal prosth r hip jt, sequela 07/02/2014    H/O total hip arthroplasty 06/19/2014   Hx of adenomatous polyp of colon 06/13/2014   Hypothyroidism 06/06/2014   Past Medical History:  Diagnosis Date   Arthritis    Cataract 11/29/2016   rt eye   Dr Clent Jacks   Hx of adenomatous polyp of colon 06/13/2014   Hypothyroidism    Thyroid disease    Tobacco abuse     Family History  Problem Relation Age of Onset   Diabetes type II Mother    Hypertension Mother    Diabetes Mother    Colon cancer Neg Hx    Thyroid disease Neg Hx    Stomach cancer Neg Hx     Past Surgical History:  Procedure Laterality Date   CHONDROPLASTY Left 04/01/2020   Procedure: CHONDROPLASTY;  Surgeon: Newt Minion, MD;  Location: [facility];  Service: Orthopedics;  Laterality: Left;   KNEE ARTHROSCOPY WITH LATERAL MENISECTOMY Left 04/01/2020   Procedure: KNEE ARTHROSCOPY WITH LATERAL MENISECTOMY;  Surgeon: Newt Minion, MD;  Location: [facility];  Service: Orthopedics;  Laterality: Left;   KNEE ARTHROSCOPY WITH MEDIAL MENISECTOMY Left 04/01/2020   Procedure: LEFT KNEE ARTHROSCOPY  WITH MEDIAL AND LATERAL MENISECTOMIES, CHONDROPLASTY AND DEBRIDEMENT;  Surgeon: Newt Minion, MD;  Location: [facility];  Service: Orthopedics;  Laterality: Left;   ORIF FEMUR FRACTURE Right 07/24/2014   Procedure: OPEN REDUCTION INTERNAL FIXATION (ORIF) DISTAL FEMUR FRACTURE;  Surgeon: Newt Minion, MD;  Location: [facility];  Service: Orthopedics;  Laterality: Right;   ORIF PERIPROSTHETIC FRACTURE Right 07/02/2014   Procedure: Open Reduction Internal Fixation Femur Fracture, Revision Femur Total Hip Arthroplasty;  Surgeon: Newt Minion, MD;  Location: [facility];  Service: Orthopedics;  Laterality: Right;   ROTATOR CUFF REPAIR  09-2013   TOTAL HIP ARTHROPLASTY Right 06/19/2014   Procedure: TOTAL HIP ARTHROPLASTY;  Surgeon: Newt Minion, MD;  Location: [facility];  Service: Orthopedics;  Laterality: Right;   VAGINAL HYSTERECTOMY  2001   complete    Social History   Occupational History   Not on file  Tobacco Use   Smoking status: Former    Packs/day: 0.50    Years: 30.00    Pack years: 15.00    Types: Cigarettes    Quit date: 11/20/2015    Years since quitting: 5.7   Smokeless tobacco: Never   Tobacco comments:    Patient reports that she is gradually trying to quit  Vaping Use   Vaping Use: Never used  Substance and Sexual Activity   Alcohol use: Yes    Alcohol/week: 14.0 standard drinks    Types: 14 Glasses of wine per week   Drug use: No   Sexual activity: Not Currently

## 2021-09-09 ENCOUNTER — Encounter: Payer: Self-pay | Admitting: Family Medicine

## 2021-09-09 ENCOUNTER — Ambulatory Visit (INDEPENDENT_AMBULATORY_CARE_PROVIDER_SITE_OTHER): Payer: Medicare HMO | Admitting: Family Medicine

## 2021-09-09 VITALS — BP 102/60 | HR 69 | Temp 97.8°F | Ht 66.0 in | Wt 195.0 lb

## 2021-09-09 DIAGNOSIS — H8110 Benign paroxysmal vertigo, unspecified ear: Secondary | ICD-10-CM

## 2021-09-09 DIAGNOSIS — I700 Atherosclerosis of aorta: Secondary | ICD-10-CM | POA: Diagnosis not present

## 2021-09-09 DIAGNOSIS — J439 Emphysema, unspecified: Secondary | ICD-10-CM | POA: Insufficient documentation

## 2021-09-09 DIAGNOSIS — E039 Hypothyroidism, unspecified: Secondary | ICD-10-CM | POA: Diagnosis not present

## 2021-09-09 DIAGNOSIS — Z79890 Hormone replacement therapy (postmenopausal): Secondary | ICD-10-CM | POA: Diagnosis not present

## 2021-09-09 MED ORDER — ATORVASTATIN CALCIUM 10 MG PO TABS: 10.0000 mg | ORAL_TABLET | Freq: Every day | ORAL | 3 refills | Status: DC

## 2021-09-09 NOTE — Assessment & Plan Note (Signed)
Stable. Cont levothyroxine 88 mcg

## 2021-09-09 NOTE — Progress Notes (Signed)
Subjective:     Bianca Collins is a 76 y.o. female presenting for Establish Care (Req. Last mammo and bone density )     HPI  #vertigo - will be laying in bed and it feels like the room is spinning - it will resolve quickly - started a few weeks ago - will occur with rolling over in bed - 3-4 days sensation   #estrogen - for several years - worried about hot flashes  Review of Systems   Social History   Tobacco Use  Smoking Status Former   Packs/day: 0.50   Years: 30.00   Total pack years: 15.00   Types: Cigarettes   Quit date: 11/20/2015   Years since quitting: 5.8  Smokeless Tobacco Never  Tobacco Comments   Patient reports that she is gradually trying to quit        Objective:    BP Readings from Last 3 Encounters:  09/09/21 102/60  08/25/21 126/71  05/25/21 118/76   Wt Readings from Last 3 Encounters:  09/09/21 195 lb (88.5 kg)  08/25/21 196 lb (88.9 kg)  05/25/21 196 lb (88.9 kg)    BP 102/60   Pulse 69   Temp 97.8 F (36.6 C) (Temporal)   Ht '5\' 6"'$  (1.676 m)   Wt 195 lb (88.5 kg)   SpO2 97%   BMI 31.47 kg/m    Physical Exam Constitutional:      General: She is not in acute distress.    Appearance: She is well-developed. She is not diaphoretic.  HENT:     Right Ear: External ear normal.     Left Ear: External ear normal.     Nose: Nose normal.  Eyes:     Conjunctiva/sclera: Conjunctivae normal.  Cardiovascular:     Rate and Rhythm: Normal rate and regular rhythm.     Heart sounds: No murmur heard. Pulmonary:     Effort: Pulmonary effort is normal. No respiratory distress.     Breath sounds: Normal breath sounds. No wheezing.  Musculoskeletal:     Cervical back: Neck supple.  Skin:    General: Skin is warm and dry.     Capillary Refill: Capillary refill takes less than 2 seconds.  Neurological:     Mental Status: She is alert. Mental status is at baseline.  Psychiatric:        Mood and Affect: Mood normal.        Behavior:  Behavior normal.           Assessment & Plan:   Problem List Items Addressed This Visit       Cardiovascular and Mediastinum   Aortic atherosclerosis ([place])    Discussed starting atorvastatin 10 mg and asa 81 mg.       Relevant Medications   atorvastatin (LIPITOR) 10 MG tablet     Endocrine   Hypothyroidism - Primary    Stable. Cont levothyroxine 88 mcg        Nervous and Auditory   Benign paroxysmal positional vertigo    Handout provided with exercise. If not improving then advised she reach out and can consider PT vs ENT        Other   Hormone replacement therapy (HRT)    Advised tapering with plan to stop estradiol. She is agreeable. She will try every other day. Update if trouble and can try reduced dose.         Return in about 6 months (around 03/11/2022).  Lesleigh Noe, MD

## 2021-09-09 NOTE — Assessment & Plan Note (Signed)
Handout provided with exercise. If not improving then advised she reach out and can consider PT vs ENT

## 2021-09-09 NOTE — Assessment & Plan Note (Signed)
Discussed starting atorvastatin 10 mg and asa 81 mg.

## 2021-09-09 NOTE — Patient Instructions (Addendum)
Brandt-Daroff Exercise Here's what you need to do for this exercise:  Start in an upright, seated position on your bed. Tilt your head around a 45-degree angle away from the side causing your vertigo. Move into the lying position on one side with your nose pointed up. Stay in this position for about 30 seconds or until the vertigo eases off, whichever is longer. Then move back to the seated position. Repeat on the other side.  You should do these movements from three to five times in a session. You should have three sessions a day for up to 2 weeks, or until the vertigo is gone for 2 days.   Look for a video to do the exercise   Estrogen - try every other day - if symptoms - let me know and we can decrease the dose to take daily  Start baby aspirin and atorvastatin for plaque build up

## 2021-09-09 NOTE — Assessment & Plan Note (Signed)
Advised tapering with plan to stop estradiol. She is agreeable. She will try every other day. Update if trouble and can try reduced dose.

## 2021-09-18 ENCOUNTER — Telehealth: Payer: Self-pay | Admitting: Family Medicine

## 2021-09-18 NOTE — Telephone Encounter (Signed)
Left message for patient to call back and schedule Medicare Annual Wellness Visit (AWV).   Please offer to do virtually or by telephone.   Last AWV:10/16/2019  Please schedule at anytime with [facility] schedule 2  45 minute appointent  If any questions, please contact me at [phone]

## 2021-10-01 ENCOUNTER — Encounter: Payer: Self-pay | Admitting: Internal Medicine

## 2021-10-13 ENCOUNTER — Other Ambulatory Visit: Payer: Self-pay | Admitting: Nurse Practitioner

## 2021-10-13 DIAGNOSIS — E034 Atrophy of thyroid (acquired): Secondary | ICD-10-CM

## 2021-10-16 ENCOUNTER — Encounter: Payer: Self-pay | Admitting: Family Medicine

## 2021-11-18 ENCOUNTER — Ambulatory Visit (AMBULATORY_SURGERY_CENTER): Payer: Medicare Other | Admitting: *Deleted

## 2021-11-18 VITALS — Ht 66.0 in | Wt 197.4 lb

## 2021-11-18 DIAGNOSIS — Z86010 Personal history of colonic polyps: Secondary | ICD-10-CM

## 2021-11-18 NOTE — Progress Notes (Signed)
  No trouble with anesthesia, denies being told they were difficult to intubate, or hx/fam hx of malignant hyperthermia per pt   No egg or soy allergy  No home oxygen use   No medications for weight loss taken Pt denies constipation issues

## 2021-12-05 ENCOUNTER — Other Ambulatory Visit: Payer: Self-pay | Admitting: Nurse Practitioner

## 2021-12-05 DIAGNOSIS — E034 Atrophy of thyroid (acquired): Secondary | ICD-10-CM

## 2021-12-07 ENCOUNTER — Other Ambulatory Visit: Payer: Self-pay | Admitting: Family Medicine

## 2021-12-07 DIAGNOSIS — E034 Atrophy of thyroid (acquired): Secondary | ICD-10-CM

## 2021-12-09 ENCOUNTER — Encounter: Payer: Self-pay | Admitting: Internal Medicine

## 2021-12-09 ENCOUNTER — Ambulatory Visit (AMBULATORY_SURGERY_CENTER): Payer: Medicare Other | Admitting: Internal Medicine

## 2021-12-09 VITALS — BP 126/63 | HR 62 | Temp 97.3°F | Resp 16 | Ht 66.0 in | Wt 197.4 lb

## 2021-12-09 DIAGNOSIS — D128 Benign neoplasm of rectum: Secondary | ICD-10-CM

## 2021-12-09 DIAGNOSIS — D123 Benign neoplasm of transverse colon: Secondary | ICD-10-CM

## 2021-12-09 DIAGNOSIS — Z86010 Personal history of colonic polyps: Secondary | ICD-10-CM

## 2021-12-09 DIAGNOSIS — E039 Hypothyroidism, unspecified: Secondary | ICD-10-CM | POA: Diagnosis not present

## 2021-12-09 DIAGNOSIS — D122 Benign neoplasm of ascending colon: Secondary | ICD-10-CM

## 2021-12-09 DIAGNOSIS — Z09 Encounter for follow-up examination after completed treatment for conditions other than malignant neoplasm: Secondary | ICD-10-CM

## 2021-12-09 DIAGNOSIS — E785 Hyperlipidemia, unspecified: Secondary | ICD-10-CM | POA: Diagnosis not present

## 2021-12-09 MED ORDER — SODIUM CHLORIDE 0.9 % IV SOLN: 500.0000 mL | Freq: Once | INTRAVENOUS | Status: DC

## 2021-12-09 NOTE — Progress Notes (Signed)
Report to PACU, RN, vss, BBS= Clear.  

## 2021-12-09 NOTE — Progress Notes (Signed)
Pt's states no medical or surgical changes since previsit or office visit. 

## 2021-12-09 NOTE — Patient Instructions (Addendum)
I removed 9 tiny polyps today - all look benign.  I will let you know pathology results and when to have another routine colonoscopy by mail and/or My Chart.  I appreciate the opportunity to care for you. Gatha Mayer, MD, [facility]   Please see handouts given to you on Polyps and Diverticulosis.  YOU HAD AN ENDOSCOPIC PROCEDURE TODAY AT [facility]:   Refer to the procedure report that was given to you for any specific questions about what was found during the examination.  If the procedure report does not answer your questions, please call your gastroenterologist to clarify.  If you requested that your care partner not be given the details of your procedure findings, then the procedure report has been included in a sealed envelope for you to review at your convenience later.  YOU SHOULD EXPECT: Some feelings of bloating in the abdomen. Passage of more gas than usual.  Walking can help get rid of the air that was put into your GI tract during the procedure and reduce the bloating. If you had a lower endoscopy (such as a colonoscopy or flexible sigmoidoscopy) you may notice spotting of blood in your stool or on the toilet paper. If you underwent a bowel prep for your procedure, you may not have a normal bowel movement for a few days.  Please Note:  You might notice some irritation and congestion in your nose or some drainage.  This is from the oxygen used during your procedure.  There is no need for concern and it should clear up in a day or so.  SYMPTOMS TO REPORT IMMEDIATELY:  Following lower endoscopy (colonoscopy or flexible sigmoidoscopy):  Excessive amounts of blood in the stool  Significant tenderness or worsening of abdominal pains  Swelling of the abdomen that is new, acute  Fever of 100F or higher  For urgent or emergent issues, a gastroenterologist can be reached at any hour by calling [phone]. Do not use MyChart messaging for urgent concerns.    DIET:   We do recommend a small meal at first, but then you may proceed to your regular diet.  Drink plenty of fluids but you should avoid alcoholic beverages for 24 hours.  ACTIVITY:  You should plan to take it easy for the rest of today and you should NOT DRIVE or use heavy machinery until tomorrow (because of the sedation medicines used during the test).    FOLLOW UP: Our staff will call the number listed on your records the next business day following your procedure.  We will call around 7:15- 8:00 am to check on you and address any questions or concerns that you may have regarding the information given to you following your procedure. If we do not reach you, we will leave a message.     If any biopsies were taken you will be contacted by phone or by letter within the next 1-3 weeks.  Please call us at [phone] if you have not heard about the biopsies in 3 weeks.    SIGNATURES/CONFIDENTIALITY: You and/or your care partner have signed paperwork which will be entered into your electronic medical record.  These signatures attest to the fact that that the information above on your After Visit Summary has been reviewed and is understood.  Full responsibility of the confidentiality of this discharge information lies with you and/or your care-partner.

## 2021-12-09 NOTE — Progress Notes (Signed)
Called to room to assist during endoscopic procedure.  Patient ID and intended procedure confirmed with present staff. Received instructions for my participation in the procedure from the performing physician.  

## 2021-12-09 NOTE — Progress Notes (Signed)
[facility] History and Physical   Primary Care Physician:  Lesleigh Noe, MD   Reason for Procedure:   Hx colon polyps  Plan:    colonoscopy     HPI: Bianca Collins is a 76 y.o. female s/p removal 3 mm adenoma 2016   Past Medical History:  Diagnosis Date   Arthritis    Cataract 11/29/2016   rt eye   Dr Clent Jacks   Hx of adenomatous polyp of colon 06/13/2014   Hyperlipidemia    Hypothyroidism    Periprosthetic fracture around internal prosthetic right hip joint ([place]) 07/05/2014   Thyroid disease    Tobacco abuse     Past Surgical History:  Procedure Laterality Date   CATARACT EXTRACTION Bilateral    CHONDROPLASTY Left 04/01/2020   Procedure: CHONDROPLASTY;  Surgeon: Newt Minion, MD;  Location: [place];  Service: Orthopedics;  Laterality: Left;   COLONOSCOPY     KNEE ARTHROSCOPY WITH LATERAL MENISECTOMY Left 04/01/2020   Procedure: KNEE ARTHROSCOPY WITH LATERAL MENISECTOMY;  Surgeon: Newt Minion, MD;  Location: [place];  Service: Orthopedics;  Laterality: Left;   KNEE ARTHROSCOPY WITH MEDIAL MENISECTOMY Left 04/01/2020   Procedure: LEFT KNEE ARTHROSCOPY  WITH MEDIAL AND LATERAL MENISECTOMIES, CHONDROPLASTY AND DEBRIDEMENT;  Surgeon: Newt Minion, MD;  Location: [place];  Service: Orthopedics;  Laterality: Left;   ORIF FEMUR FRACTURE Right 07/24/2014   Procedure: OPEN REDUCTION INTERNAL FIXATION (ORIF) DISTAL FEMUR FRACTURE;  Surgeon: Newt Minion, MD;  Location: [place];  Service: Orthopedics;  Laterality: Right;   ORIF PERIPROSTHETIC FRACTURE Right 07/02/2014   Procedure: Open Reduction Internal Fixation Femur Fracture, Revision Femur Total Hip Arthroplasty;  Surgeon: Newt Minion, MD;  Location: [place];  Service: Orthopedics;  Laterality: Right;   ROTATOR CUFF REPAIR Right 09/19/2013   TOTAL HIP ARTHROPLASTY Right 06/19/2014   Procedure: TOTAL HIP ARTHROPLASTY;  Surgeon: Newt Minion, MD;   Location: [place];  Service: Orthopedics;  Laterality: Right;   VAGINAL HYSTERECTOMY  03/23/1999   complete    Prior to Admission medications   Medication Sig Start Date End Date Taking? Authorizing Provider  ASPIRIN 81 PO Take by mouth daily.   Yes [provider]  atorvastatin (LIPITOR) 10 MG tablet Take 1 tablet (10 mg total) by mouth daily. 09/09/21  Yes Lesleigh Noe, MD  B Complex-C (B-COMPLEX WITH VITAMIN C) tablet Take 1 tablet by mouth daily.   Yes [provider]  Cholecalciferol (VITAMIN D3) 2000 units TABS Take 1 tablet by mouth daily.   Yes [provider]  estradiol (ESTRACE) 1 MG tablet Take 1 mg by mouth daily.   Yes [provider]  levothyroxine (SYNTHROID) 88 MCG tablet TAKE 1 TABLET BY MOUTH ON EMPTY STOMACH 30 MINUTES BEFORE BREAKFAST FOR THYROID (MUST MAKE APPT) 12/08/21  Yes Lesleigh Noe, MD  Multiple Vitamin (MULTIVITAMIN) tablet Take 1 tablet by mouth daily.   Yes [provider]    Current Outpatient Medications  Medication Sig Dispense Refill   ASPIRIN 81 PO Take by mouth daily.     atorvastatin (LIPITOR) 10 MG tablet Take 1 tablet (10 mg total) by mouth daily. 90 tablet 3   B Complex-C (B-COMPLEX WITH VITAMIN C) tablet Take 1 tablet by mouth daily.     Cholecalciferol (VITAMIN D3) 2000 units TABS Take 1 tablet by mouth daily.     estradiol (ESTRACE) 1 MG tablet Take 1 mg by mouth daily.  levothyroxine (SYNTHROID) 88 MCG tablet TAKE 1 TABLET BY MOUTH ON EMPTY STOMACH 30 MINUTES BEFORE BREAKFAST FOR THYROID (MUST MAKE APPT) 90 tablet 1   Multiple Vitamin (MULTIVITAMIN) tablet Take 1 tablet by mouth daily.     Current Facility-Administered Medications  Medication Dose Route Frequency Provider Last Rate Last Admin   0.9 %  sodium chloride infusion  500 mL Intravenous Once Gatha Mayer, MD        Allergies as of 12/09/2021   (No Known Allergies)    Family History  Problem Relation Age of Onset   Diabetes  type II Mother    Hypertension Mother    Diabetes Mother    Colon cancer Neg Hx    Thyroid disease Neg Hx    Stomach cancer Neg Hx    Esophageal cancer Neg Hx    Rectal cancer Neg Hx     Social History   Socioeconomic History   Marital status: Divorced    Spouse name: Not on file   Number of children: Not on file   Years of education: Not on file   Highest education level: Not on file  Occupational History   Not on file  Tobacco Use   Smoking status: Former    Packs/day: 0.50    Years: 30.00    Total pack years: 15.00    Types: Cigarettes    Quit date: 11/20/2015    Years since quitting: 6.0   Smokeless tobacco: Never   Tobacco comments:    Patient reports that she is gradually trying to quit  Vaping Use   Vaping Use: Never used  Substance and Sexual Activity   Alcohol use: Yes    Alcohol/week: 14.0 standard drinks of alcohol    Types: 14 Glasses of wine per week    Comment: 2 glasses of wine at night   Drug use: No   Sexual activity: Not Currently    Birth control/protection: Surgical  Other Topics Concern   Not on file  Social History Narrative   09/09/21   From: the area   Living: with daughter and son-in-law   Work: retired - a little bit of everything - Scientist, water quality       Family: Aaron Edelman ([place]) and [place] - 3 grandchildren      Enjoys: crochet, read, cross-stitch      Exercise: [employer]ca - 3 times a week   Diet: better than what she used to       Safety   Seat belts: Yes    Guns: Yes  and secure   Safe in relationships: Yes          Social Determinants of Radio broadcast assistant Strain: Not on file  Food Insecurity: Not on file  Transportation Needs: Not on file  Physical Activity: Not on file  Stress: Not on file  Social Connections: Not on file  Intimate Partner Violence: Not on file    Review of Systems:  All other review of systems negative except as mentioned in the HPI.  Physical Exam: Vital signs BP 125/61   Pulse 69   Temp (!) 97.3  F (36.3 C)   Ht '5\' 6"'$  (1.676 m)   Wt 197 lb 6.4 oz (89.5 kg)   SpO2 95%   BMI 31.86 kg/m   General:   Alert,  Well-developed, well-nourished, pleasant and cooperative in NAD Lungs:  Clear throughout to auscultation.   Heart:  Regular rate and rhythm; no murmurs, clicks, rubs,  or gallops.  Abdomen:  Soft, nontender and nondistended. Normal bowel sounds.   Neuro/Psych:  Alert and cooperative. Normal mood and affect. A and O x 3   '@Anina Schnake'$  Simonne Maffucci, MD, [facility] [phone] (pager) 12/09/2021 11:17 AM@

## 2021-12-09 NOTE — Op Note (Signed)
[place] Patient Name: Bianca Collins Procedure Date: 12/09/2021 11:22 AM MRN: [id number] Endoscopist: Gatha Mayer , MD Age: 76 Referring MD:  Date of Birth: [birth year] Gender: Female Account #: [id number] Procedure:                Colonoscopy Indications:              Surveillance: Personal history of adenomatous                            polyps on last colonoscopy > 5 years ago, Last                            colonoscopy: 2016 Medicines:                Monitored Anesthesia Care Procedure:                Pre-Anesthesia Assessment:                           - Prior to the procedure, a History and Physical                            was performed, and patient medications and                            allergies were reviewed. The patient's tolerance of                            previous anesthesia was also reviewed. The risks                            and benefits of the procedure and the sedation                            options and risks were discussed with the patient.                            All questions were answered, and informed consent                            was obtained. Prior Anticoagulants: The patient has                            taken no previous anticoagulant or antiplatelet                            agents. ASA Grade Assessment: II - A patient with                            mild systemic disease. After reviewing the risks                            and benefits, the patient was deemed in  satisfactory condition to undergo the procedure.                           After obtaining informed consent, the colonoscope                            was passed under direct vision. Throughout the                            procedure, the patient's blood pressure, pulse, and                            oxygen saturations were monitored continuously. The                            PCF-HQ190L Colonoscope was introduced through the                             anus and advanced to the the cecum, identified by                            appendiceal orifice and ileocecal valve. The                            colonoscopy was performed without difficulty. The                            patient tolerated the procedure well. The quality                            of the bowel preparation was good. The ileocecal                            valve, appendiceal orifice, and rectum were                            photographed. Scope In: 11:25:12 AM Scope Out: 11:49:33 AM Scope Withdrawal Time: 0 hours 21 minutes 5 seconds  Total Procedure Duration: 0 hours 24 minutes 21 seconds  Findings:                 The perianal and digital rectal examinations were                            normal.                           Nine sessile polyps were found in the rectum,                            transverse colon and ascending colon. The polyps                            were diminutive in size. These polyps were removed  with a cold snare. Resection and retrieval were                            complete. Verification of patient identification                            for the specimen was done. Estimated blood loss was                            minimal.                           Multiple small and large-mouthed diverticula were                            found in the sigmoid colon.                           The exam was otherwise without abnormality on                            direct and retroflexion views. Complications:            No immediate complications. Estimated Blood Loss:     Estimated blood loss was minimal. Impression:               - Nine diminutive polyps in the rectum, in the                            transverse colon and in the ascending colon,                            removed with a cold snare. Resected and retrieved.                           - Diverticulosis in the sigmoid colon.                            - The examination was otherwise normal on direct                            and retroflexion views.                           - Personal history of colonic polyp diminutive                            adenoma 2016. Recommendation:           - Patient has a contact number available for                            emergencies. The signs and symptoms of potential                            delayed complications were discussed with the  patient. Return to normal activities tomorrow.                            Written discharge instructions were provided to the                            patient.                           - Resume previous diet.                           - Continue present medications.                           - Repeat colonoscopy is recommended for                            surveillance. The colonoscopy date will be                            determined after pathology results from today's                            exam become available for review. Gatha Mayer, MD 12/09/2021 11:55:09 AM This report has been signed electronically.

## 2021-12-10 ENCOUNTER — Telehealth: Payer: Self-pay | Admitting: *Deleted

## 2021-12-10 NOTE — Telephone Encounter (Signed)
  Follow up Call-     12/09/2021   10:45 AM  Call back number  Post procedure Call Back phone  # [phone]  Permission to leave phone message Yes    First attempt for follow up phone call. No answer at number given.  Left message on voicemail.

## 2021-12-27 ENCOUNTER — Encounter: Payer: Self-pay | Admitting: Internal Medicine

## 2021-12-27 DIAGNOSIS — Z86010 Personal history of colonic polyps: Secondary | ICD-10-CM

## 2021-12-27 DIAGNOSIS — Z860101 Personal history of adenomatous and serrated colon polyps: Secondary | ICD-10-CM

## 2022-01-05 ENCOUNTER — Encounter: Payer: Medicare Other | Admitting: Family

## 2022-01-11 ENCOUNTER — Ambulatory Visit (INDEPENDENT_AMBULATORY_CARE_PROVIDER_SITE_OTHER): Payer: Medicare Other

## 2022-01-11 VITALS — Ht 66.0 in | Wt 193.0 lb

## 2022-01-11 DIAGNOSIS — Z0000 Encounter for general adult medical examination without abnormal findings: Secondary | ICD-10-CM | POA: Diagnosis not present

## 2022-01-11 NOTE — Patient Instructions (Signed)
[name] , Thank you for taking time to come for your Medicare Wellness Visit. I appreciate your ongoing commitment to your health goals. Please review the following plan we discussed and let me know if I can assist you in the future.   Screening recommendations/referrals: Colonoscopy: completed 12/09/2021, due 12/09/2024 Mammogram: completed 02/23/2021, due 02/24/2022 Bone Density: completed 08/30/2016 Recommended yearly ophthalmology/optometry visit for glaucoma screening and checkup Recommended yearly dental visit for hygiene and checkup  Vaccinations: Influenza vaccine: decline Pneumococcal vaccine: decline Tdap vaccine: decline Shingles vaccine: completed   Covid-19: 07/31/2019, 07/10/2019  Advanced directives: Please bring a copy of your POA (Power of Attorney) and/or Living Will to your next appointment.   Conditions/risks identified: none  Next appointment: Follow up in one year for your annual wellness visit    Preventive Care 65 Years and Older, Female Preventive care refers to lifestyle choices and visits with your health care provider that can promote health and wellness. What does preventive care include? A yearly physical exam. This is also called an annual well check. Dental exams once or twice a year. Routine eye exams. Ask your health care provider how often you should have your eyes checked. Personal lifestyle choices, including: Daily care of your teeth and gums. Regular physical activity. Eating a healthy diet. Avoiding tobacco and drug use. Limiting alcohol use. Practicing safe sex. Taking low-dose aspirin every day. Taking vitamin and mineral supplements as recommended by your health care provider. What happens during an annual well check? The services and screenings done by your health care provider during your annual well check will depend on your age, overall health, lifestyle risk factors, and family history of disease. Counseling  Your health care  provider may ask you questions about your: Alcohol use. Tobacco use. Drug use. Emotional well-being. Home and relationship well-being. Sexual activity. Eating habits. History of falls. Memory and ability to understand (cognition). Work and work Statistician. Reproductive health. Screening  You may have the following tests or measurements: Height, weight, and BMI. Blood pressure. Lipid and cholesterol levels. These may be checked every 5 years, or more frequently if you are over 22 years old. Skin check. Lung cancer screening. You may have this screening every year starting at age 109 if you have a 30-pack-year history of smoking and currently smoke or have quit within the past 15 years. Fecal occult blood test (FOBT) of the stool. You may have this test every year starting at age 34. Flexible sigmoidoscopy or colonoscopy. You may have a sigmoidoscopy every 5 years or a colonoscopy every 10 years starting at age 49. Hepatitis C blood test. Hepatitis B blood test. Sexually transmitted disease (STD) testing. Diabetes screening. This is done by checking your blood sugar (glucose) after you have not eaten for a while (fasting). You may have this done every 1-3 years. Bone density scan. This is done to screen for osteoporosis. You may have this done starting at age 45. Mammogram. This may be done every 1-2 years. Talk to your health care provider about how often you should have regular mammograms. Talk with your health care provider about your test results, treatment options, and if necessary, the need for more tests. Vaccines  Your health care provider may recommend certain vaccines, such as: Influenza vaccine. This is recommended every year. Tetanus, diphtheria, and acellular pertussis (Tdap, Td) vaccine. You may need a Td booster every 10 years. Zoster vaccine. You may need this after age 92. Pneumococcal 13-valent conjugate (PCV13) vaccine. One dose is recommended  after age  61. Pneumococcal polysaccharide (PPSV23) vaccine. One dose is recommended after age 62. Talk to your health care provider about which screenings and vaccines you need and how often you need them. This information is not intended to replace advice given to you by your health care provider. Make sure you discuss any questions you have with your health care provider. Document Released: 04/04/2015 Document Revised: 11/26/2015 Document Reviewed: 01/07/2015 Elsevier Interactive Patient Education  2017 [place] Prevention in the Home Falls can cause injuries. They can happen to people of all ages. There are many things you can do to make your home safe and to help prevent falls. What can I do on the outside of my home? Regularly fix the edges of walkways and driveways and fix any cracks. Remove anything that might make you trip as you walk through a door, such as a raised step or threshold. Trim any bushes or trees on the path to your home. Use bright outdoor lighting. Clear any walking paths of anything that might make someone trip, such as rocks or tools. Regularly check to see if handrails are loose or broken. Make sure that both sides of any steps have handrails. Any raised decks and porches should have guardrails on the edges. Have any leaves, snow, or ice cleared regularly. Use sand or salt on walking paths during winter. Clean up any spills in your garage right away. This includes oil or grease spills. What can I do in the bathroom? Use night lights. Install grab bars by the toilet and in the tub and shower. Do not use towel bars as grab bars. Use non-skid mats or decals in the tub or shower. If you need to sit down in the shower, use a plastic, non-slip stool. Keep the floor dry. Clean up any water that spills on the floor as soon as it happens. Remove soap buildup in the tub or shower regularly. Attach bath mats securely with double-sided non-slip rug tape. Do not have throw  rugs and other things on the floor that can make you trip. What can I do in the bedroom? Use night lights. Make sure that you have a light by your bed that is easy to reach. Do not use any sheets or blankets that are too big for your bed. They should not hang down onto the floor. Have a firm chair that has side arms. You can use this for support while you get dressed. Do not have throw rugs and other things on the floor that can make you trip. What can I do in the kitchen? Clean up any spills right away. Avoid walking on wet floors. Keep items that you use a lot in easy-to-reach places. If you need to reach something above you, use a strong step stool that has a grab bar. Keep electrical cords out of the way. Do not use floor polish or wax that makes floors slippery. If you must use wax, use non-skid floor wax. Do not have throw rugs and other things on the floor that can make you trip. What can I do with my stairs? Do not leave any items on the stairs. Make sure that there are handrails on both sides of the stairs and use them. Fix handrails that are broken or loose. Make sure that handrails are as long as the stairways. Check any carpeting to make sure that it is firmly attached to the stairs. Fix any carpet that is loose or worn. Avoid having throw  rugs at the top or bottom of the stairs. If you do have throw rugs, attach them to the floor with carpet tape. Make sure that you have a light switch at the top of the stairs and the bottom of the stairs. If you do not have them, ask someone to add them for you. What else can I do to help prevent falls? Wear shoes that: Do not have high heels. Have rubber bottoms. Are comfortable and fit you well. Are closed at the toe. Do not wear sandals. If you use a stepladder: Make sure that it is fully opened. Do not climb a closed stepladder. Make sure that both sides of the stepladder are locked into place. Ask someone to hold it for you, if  possible. Clearly mark and make sure that you can see: Any grab bars or handrails. First and last steps. Where the edge of each step is. Use tools that help you move around (mobility aids) if they are needed. These include: Canes. Walkers. Scooters. Crutches. Turn on the lights when you go into a dark area. Replace any light bulbs as soon as they burn out. Set up your furniture so you have a clear path. Avoid moving your furniture around. If any of your floors are uneven, fix them. If there are any pets around you, be aware of where they are. Review your medicines with your doctor. Some medicines can make you feel dizzy. This can increase your chance of falling. Ask your doctor what other things that you can do to help prevent falls. This information is not intended to replace advice given to you by your health care provider. Make sure you discuss any questions you have with your health care provider. Document Released: 01/02/2009 Document Revised: 08/14/2015 Document Reviewed: 04/12/2014 Elsevier Interactive Patient Education  2017 [employer].

## 2022-01-11 NOTE — Progress Notes (Signed)
I connected with Bianca Collins today by telephone and verified that I am speaking with the correct person using two identifiers. Location patient: home Location provider: work Persons participating in the virtual visit: Yanitza, Shvartsman LPN.   I discussed the limitations, risks, security and privacy concerns of performing an evaluation and management service by telephone and the availability of in person appointments. I also discussed with the patient that there may be a patient responsible charge related to this service. The patient expressed understanding and verbally consented to this telephonic visit.    Interactive audio and video telecommunications were attempted between this provider and patient, however failed, due to patient having technical difficulties OR patient did not have access to video capability.  We continued and completed visit with audio only.     Vital signs may be patient reported or missing.  Subjective:   Bianca Collins is a 76 y.o. female who presents for Medicare Annual (Subsequent) preventive examination.  Review of Systems     Cardiac Risk Factors include: advanced age (>65mn, >>3women);obesity (BMI >30kg/m2)     Objective:    Today's Vitals   01/11/22 1026  Weight: 193 lb (87.5 kg)  Height: '5\' 6"'$  (1.676 m)   Body mass index is 31.15 kg/m.     01/11/2022   10:30 AM 05/25/2021    9:51 AM 05/16/2020   10:11 AM 04/01/2020    7:21 AM 03/25/2020   11:23 AM 10/16/2019    8:23 AM 10/12/2019   10:01 AM  Advanced Directives  Does Patient Have a Medical Advance Directive? Yes Yes Yes Yes Yes Yes Yes  Type of AParamedicof AHissopLiving will Healthcare Power of [place]organ CityLiving will Living will  Healthcare Power of [place]outh [place] will  Does patient want to make changes to medical advance directive?  No - Patient declined No - Patient declined No - Patient declined No -  Patient declined No - Patient declined No - Patient declined  Copy of HDe [place] Chart? No - copy requested Yes - validated most recent copy scanned in chart (See row information) Yes - validated most recent copy scanned in chart (See row information) No - copy requested No - copy requested Yes - validated most recent copy scanned in chart (See row information) Yes - validated most recent copy scanned in chart (See row information)    Current Medications (verified) Outpatient Encounter Medications as of 01/11/2022  Medication Sig   ASPIRIN 81 PO Take by mouth daily.   atorvastatin (LIPITOR) 10 MG tablet Take 1 tablet (10 mg total) by mouth daily.   B Complex-C (B-COMPLEX WITH VITAMIN C) tablet Take 1 tablet by mouth daily.   Cholecalciferol (VITAMIN D3) 2000 units TABS Take 1 tablet by mouth daily.   estradiol (ESTRACE) 1 MG tablet Take 1 mg by mouth daily.   levothyroxine (SYNTHROID) 88 MCG tablet TAKE 1 TABLET BY MOUTH ON EMPTY STOMACH 30 MINUTES BEFORE BREAKFAST FOR THYROID (MUST MAKE APPT)   Multiple Vitamin (MULTIVITAMIN) tablet Take 1 tablet by mouth daily.   No facility-administered encounter medications on file as of 01/11/2022.    Allergies (verified) Patient has no known allergies.   History: Past Medical History:  Diagnosis Date   Arthritis    Cataract 11/29/2016   rt eye   Dr RClent Jacks  Hx of adenomatous polyp of colon 06/13/2014   Hyperlipidemia    Hypothyroidism  Periprosthetic fracture around internal prosthetic right hip joint ([place]) 07/05/2014   Thyroid disease    Tobacco abuse    Past Surgical History:  Procedure Laterality Date   CATARACT EXTRACTION Bilateral    CHONDROPLASTY Left 04/01/2020   Procedure: CHONDROPLASTY;  Surgeon: Newt Minion, MD;  Location: [place];  Service: Orthopedics;  Laterality: Left;   COLONOSCOPY     KNEE ARTHROSCOPY WITH LATERAL MENISECTOMY Left 04/01/2020   Procedure: KNEE ARTHROSCOPY  WITH LATERAL MENISECTOMY;  Surgeon: Newt Minion, MD;  Location: [place];  Service: Orthopedics;  Laterality: Left;   KNEE ARTHROSCOPY WITH MEDIAL MENISECTOMY Left 04/01/2020   Procedure: LEFT KNEE ARTHROSCOPY  WITH MEDIAL AND LATERAL MENISECTOMIES, CHONDROPLASTY AND DEBRIDEMENT;  Surgeon: Newt Minion, MD;  Location: [place];  Service: Orthopedics;  Laterality: Left;   ORIF FEMUR FRACTURE Right 07/24/2014   Procedure: OPEN REDUCTION INTERNAL FIXATION (ORIF) DISTAL FEMUR FRACTURE;  Surgeon: Newt Minion, MD;  Location: [place];  Service: Orthopedics;  Laterality: Right;   ORIF PERIPROSTHETIC FRACTURE Right 07/02/2014   Procedure: Open Reduction Internal Fixation Femur Fracture, Revision Femur Total Hip Arthroplasty;  Surgeon: Newt Minion, MD;  Location: [place];  Service: Orthopedics;  Laterality: Right;   ROTATOR CUFF REPAIR Right 09/19/2013   TOTAL HIP ARTHROPLASTY Right 06/19/2014   Procedure: TOTAL HIP ARTHROPLASTY;  Surgeon: Newt Minion, MD;  Location: [place];  Service: Orthopedics;  Laterality: Right;   VAGINAL HYSTERECTOMY  03/23/1999   complete   Family History  Problem Relation Age of Onset   Diabetes type II Mother    Hypertension Mother    Diabetes Mother    Colon cancer Neg Hx    Thyroid disease Neg Hx    Stomach cancer Neg Hx    Esophageal cancer Neg Hx    Rectal cancer Neg Hx    Social History   Socioeconomic History   Marital status: Divorced    Spouse name: Not on file   Number of children: Not on file   Years of education: Not on file   Highest education level: Not on file  Occupational History   Not on file  Tobacco Use   Smoking status: Former    Packs/day: 0.50    Years: 30.00    Total pack years: 15.00    Types: Cigarettes    Quit date: 11/20/2015    Years since quitting: 6.1   Smokeless tobacco: Never   Tobacco comments:    Patient reports that she is gradually trying to quit  Vaping Use   Vaping Use: Never  used  Substance and Sexual Activity   Alcohol use: Yes    Alcohol/week: 14.0 standard drinks of alcohol    Types: 14 Glasses of wine per week    Comment: 2 glasses of wine at night   Drug use: No   Sexual activity: Not Currently    Birth control/protection: Surgical  Other Topics Concern   Not on file  Social History Narrative   09/09/21   From: the area   Living: with daughter and son-in-law   Work: retired - a little bit of everything - Scientist, water quality       Family: Aaron Edelman ([place]) and [place] - 3 grandchildren      Enjoys: crochet, read, cross-stitch      Exercise: ymca - 3 times a week   Diet: better than what she used to       Land  belts: Yes    Guns: Yes  and secure   Safe in relationships: Yes          Social Determinants of Health   Financial Resource Strain: Low Risk  (01/11/2022)   Overall Financial Resource Strain (CARDIA)    Difficulty of Paying Living Expenses: Not hard at all  Food Insecurity: No Food Insecurity (01/11/2022)   Hunger Vital Sign    Worried About Running Out of Food in the Last Year: Never true    Ran Out of Food in the Last Year: Never true  Transportation Needs: No Transportation Needs (01/11/2022)   PRAPARE - Hydrologist (Medical): No    Lack of Transportation (Non-Medical): No  Physical Activity: Inactive (01/11/2022)   Exercise Vital Sign    Days of Exercise per Week: 0 days    Minutes of Exercise per Session: 0 min  Stress: No Stress Concern Present (01/11/2022)   [place]    Feeling of Stress : Not at all  Social Connections: Not on file    Tobacco Counseling Counseling given: Not Answered Tobacco comments: Patient reports that she is gradually trying to quit   Clinical Intake:  Pre-visit preparation completed: Yes  Pain : No/denies pain     Nutritional Status: BMI > 30  Obese Nutritional Risks: None Diabetes: No  How  often do you need to have someone help you when you read instructions, pamphlets, or other written materials from your doctor or pharmacy?: 1 - Never What is the last grade level you completed in school?: 12th grade  Diabetic? no  Interpreter Needed?: No  Information entered by :: NAllen LPN   Activities of Daily Living    01/11/2022   10:31 AM  In your present state of health, do you have any difficulty performing the following activities:  Hearing? 0  Vision? 0  Difficulty concentrating or making decisions? 0  Walking or climbing stairs? 0  Dressing or bathing? 0  Doing errands, shopping? 0  Preparing Food and eating ? N  Using the Toilet? N  In the past six months, have you accidently leaked urine? Y  Do you have problems with loss of bowel control? N  Managing your Medications? N  Managing your Finances? N  Housekeeping or managing your Housekeeping? N    Patient Care Team: Waunita Schooner, MD as PCP - General (Family Medicine)  Indicate any recent Medical Services you may have received from other than [facility] providers in the past year (date may be approximate).     Assessment:   This is a routine wellness examination for [place].  Hearing/Vision screen Vision Screening - Comments:: Regular eye exams, [facility]  Dietary issues and exercise activities discussed: Current Exercise Habits: The patient does not participate in regular exercise at present   Goals Addressed             This Visit's Progress    Patient Stated       01/11/2022, wants to lose weight       Depression Screen    01/11/2022   10:31 AM 09/09/2021   10:41 AM 05/25/2021   10:33 AM 10/16/2019    8:21 AM 10/12/2019   10:01 AM 05/24/2019    9:36 AM 10/11/2018   10:42 AM  PHQ 2/9 Scores  PHQ - 2 Score 0 0 0 0 0 0 0    Fall Risk    01/11/2022  10:30 AM 05/25/2021   10:33 AM 05/16/2020   10:11 AM 10/16/2019    8:23 AM 10/12/2019   10:01 AM  Fall Risk   Falls in the past year? 0 0 0 0 0   Number falls in past yr: 0 0 0 0 0  Injury with Fall? 0 0 0 0 0  Risk for fall due to : Medication side effect No Fall Risks     Follow up Falls prevention discussed;Education provided;Falls evaluation completed Falls evaluation completed       FALL RISK PREVENTION PERTAINING TO THE HOME:  Any stairs in or around the home? Yes  If so, are there any without handrails? No  Home free of loose throw rugs in walkways, pet beds, electrical cords, etc? Yes  Adequate lighting in your home to reduce risk of falls? Yes   ASSISTIVE DEVICES UTILIZED TO PREVENT FALLS:  Life alert? No  Use of a cane, walker or w/c? No  Grab bars in the bathroom? Yes  Shower chair or bench in shower? No  Elevated toilet seat or a handicapped toilet? No   TIMED UP AND GO:  Was the test performed? No .      Cognitive Function:    12/07/2016    9:58 AM 05/28/2015   10:27 AM 05/28/2015   10:03 AM  MMSE - Mini Mental State Exam  Orientation to time '5 4 4  '$ Orientation to Place '5 5 5  '$ Registration '3 3 3  '$ Attention/ Calculation '5 5 5  '$ Recall '3 3 3  '$ Language- name 2 objects '2 2 2  '$ Language- repeat '1 1 1  '$ Language- follow 3 step command '3 3 3  '$ Language- read & follow direction '1 1 1  '$ Write a sentence '1 1 1  '$ Copy design 0 1 1  Total score '29 29 29        '$ 01/11/2022   10:32 AM 10/16/2019    8:25 AM 10/11/2018    3:37 PM  6CIT Screen  What Year? 0 points 0 points 0 points  What month? 0 points 0 points 0 points  What time? 0 points 0 points 0 points  Count back from 20 0 points 0 points 0 points  Months in reverse 0 points 0 points 0 points  Repeat phrase 0 points 0 points 0 points  Total Score 0 points 0 points 0 points    Immunizations Immunization History  Administered Date(s) Administered   PFIZER(Purple Top)SARS-COV-2 Vaccination 07/10/2019, 07/31/2019   Zoster Recombinat (Shingrix) 01/11/2020, 09/05/2020    TDAP status: Due, Education has been provided regarding the importance of this  vaccine. Advised may receive this vaccine at local pharmacy or Health Dept. Aware to provide a copy of the vaccination record if obtained from local pharmacy or Health Dept. Verbalized acceptance and understanding.  Flu Vaccine status: Declined, Education has been provided regarding the importance of this vaccine but patient still declined. Advised may receive this vaccine at local pharmacy or Health Dept. Aware to provide a copy of the vaccination record if obtained from local pharmacy or Health Dept. Verbalized acceptance and understanding.  Pneumococcal vaccine status: Declined,  Education has been provided regarding the importance of this vaccine but patient still declined. Advised may receive this vaccine at local pharmacy or Health Dept. Aware to provide a copy of the vaccination record if obtained from local pharmacy or Health Dept. Verbalized acceptance and understanding.   Covid-19 vaccine status: Completed vaccines  Qualifies for Shingles Vaccine? Yes  Zostavax completed Yes   Shingrix Completed?: Yes  Screening Tests Health Maintenance  Topic Date Due   COVID-19 Vaccine (3 - Pfizer series) 09/25/2019   Pneumonia Vaccine 1+ Years old (1 - PCV) 05/26/2022 (Originally 02/09/1952)   TETANUS/TDAP  12/08/2026 (Originally 02/10/1965)   MAMMOGRAM  02/24/2023   COLONOSCOPY (Pts 45-62yr Insurance coverage will need to be confirmed)  12/09/2024   DEXA SCAN  Completed   Hepatitis C Screening  Completed   Zoster Vaccines- Shingrix  Completed   HPV VACCINES  Aged Out   INFLUENZA VACCINE  Discontinued    Health Maintenance  Health Maintenance Due  Topic Date Due   COVID-19 Vaccine (3 - Pfizer series) 09/25/2019    Colorectal cancer screening: Type of screening: Colonoscopy. Completed 12/09/2021. Repeat every 3 years  Mammogram status: Completed 02/23/2021. Repeat every year  Bone Density status: Completed 08/30/2016  Lung Cancer Screening: (Low Dose CT Chest recommended if Age  74-80years, 30 pack-year currently smoking OR have quit w/in 15years.) does not qualify.   Lung Cancer Screening Referral: no  Additional Screening:  Hepatitis C Screening: does qualify; Completed 03/23/2018  Vision Screening: Recommended annual ophthalmology exams for early detection of glaucoma and other disorders of the eye. Is the patient up to date with their annual eye exam?  Yes  Who is the provider or what is the name of the [facility] which the patient [facility] If pt is not established with a provider, would they like to be referred to a provider to establish care? No .   Dental Screening: Recommended annual dental exams for proper oral hygiene  Community Resource Referral / Chronic Care Management: CRR required this visit?  No   CCM required this visit?  No      Plan:     I have personally reviewed and noted the following in the patient's chart:   Medical and social history Use of alcohol, tobacco or illicit drugs  Current medications and supplements including opioid prescriptions. Patient is not currently taking opioid prescriptions. Functional ability and status Nutritional status Physical activity Advanced directives List of other physicians Hospitalizations, surgeries, and ER visits in previous 12 months Vitals Screenings to include cognitive, depression, and falls Referrals and appointments  In addition, I have reviewed and discussed with patient certain preventive protocols, quality metrics, and best practice recommendations. A written personalized care plan for preventive services as well as general preventive health recommendations were provided to patient.     NKellie Simmering LPN   [id number]  Nurse Notes: none  Due to this being a virtual visit, the after visit summary with patients personalized plan was offered to patient via mail or my-chart.  to pick up at office at next visit

## 2022-01-13 ENCOUNTER — Ambulatory Visit (INDEPENDENT_AMBULATORY_CARE_PROVIDER_SITE_OTHER): Payer: Medicare Other | Admitting: Family

## 2022-01-13 ENCOUNTER — Encounter: Payer: Self-pay | Admitting: Family

## 2022-01-13 DIAGNOSIS — M1712 Unilateral primary osteoarthritis, left knee: Secondary | ICD-10-CM | POA: Diagnosis not present

## 2022-01-13 MED ORDER — LIDOCAINE HCL 1 % IJ SOLN
5.0000 mL | INTRAMUSCULAR | Status: AC | PRN
  Administered 2022-01-13: 5 mL

## 2022-01-13 MED ORDER — METHYLPREDNISOLONE ACETATE 40 MG/ML IJ SUSP
40.0000 mg | INTRAMUSCULAR | Status: AC | PRN
  Administered 2022-01-13: 40 mg via INTRA_ARTICULAR

## 2022-01-13 NOTE — Progress Notes (Signed)
Office Visit Note   Patient: Bianca Collins           Date of Birth: 07-06-1945           MRN: [id number] Visit Date: 01/13/2022              Requested by: Waunita Schooner, MD [address] [place],  [place] [address] PCP: Waunita Schooner, MD  No chief complaint on file.     HPI: The patient is a 76 year old woman who presents today in follow-up for arthritic pain to the left knee.  She last had a Depo-Medrol injection in January of this year has done quite well until about a month ago she complains of start up stiffness and deep knee pain no falls no giving way  Assessment & Plan: Visit Diagnoses: No diagnosis found.  Plan: Depo-Medrol injection left knee today.  Patient tolerated well.  She will follow-up as needed.  Follow-Up Instructions: No follow-ups on file.   Left Knee Exam   Muscle Strength  The patient has normal left knee strength.  Tenderness  The patient is experiencing tenderness in the medial joint line.  Range of Motion  The patient has normal left knee ROM.  Other  Effusion: no effusion present      Patient is alert, oriented, no adenopathy, well-dressed, normal affect, normal respiratory effort.   Imaging: No results found. No images are attached to the encounter.  Labs: No results found for: "HGBA1C", "ESRSEDRATE", "CRP", "LABURIC", "REPTSTATUS", "GRAMSTAIN", "CULT", "LABORGA"   Lab Results  Component Value Date   ALBUMIN 4.0 05/28/2015   ALBUMIN 4.0 06/10/2014    No results found for: "MG" No results found for: "VD25OH"  No results found for: "PREALBUMIN"    Latest Ref Rng & Units 05/25/2021   11:30 AM 10/12/2019   10:35 AM 03/21/2018    3:58 PM  CBC EXTENDED  WBC 3.8 - 10.8 Thousand/uL 9.1  9.7  8.9   RBC 3.80 - 5.10 Million/uL 4.20  4.31  4.21   Hemoglobin 11.7 - 15.5 g/dL 13.4  13.8  13.6   HCT 35.0 - 45.0 % 40.1  41.2  39.7   Platelets 140 - 400 Thousand/uL 256  275  275   NEUT# 1,500 - 7,800 cells/uL 5,678  6,645  5,135    Lymph# 850 - 3,900 cells/uL 2,348  2,018  2,510      There is no height or weight on file to calculate BMI.  Orders:  No orders of the defined types were placed in this encounter.  No orders of the defined types were placed in this encounter.    Procedures: Large Joint Inj: L knee on 01/13/2022 2:03 PM Indications: pain Details: 18 G 1.5 in needle, anteromedial approach Medications: 5 mL lidocaine 1 %; 40 mg methylPREDNISolone acetate 40 MG/ML Consent was given by the patient.      Clinical Data: No additional findings.  ROS:  All other systems negative, except as noted in the HPI. Review of Systems  Objective: Vital Signs: There were no vitals taken for this visit.  Specialty Comments:  No specialty comments available.  PMFS History: Patient Active Problem List   Diagnosis Date Noted   Emphysema lung ([place]) 09/09/2021   Hormone replacement therapy (HRT) 09/09/2021   Benign paroxysmal positional vertigo 09/09/2021   Aortic atherosclerosis ([place]) 01/12/2019   H/O total hip arthroplasty 06/19/2014   Hx of adenomatous polyp of colon 06/13/2014   Hypothyroidism 06/06/2014   Past Medical History:  Diagnosis Date   Arthritis    Cataract 11/29/2016   rt eye   Dr Clent Jacks   Hx of adenomatous polyp of colon 06/13/2014   Hyperlipidemia    Hypothyroidism    Periprosthetic fracture around internal prosthetic right hip joint ([place]) 07/05/2014   Thyroid disease    Tobacco abuse     Family History  Problem Relation Age of Onset   Diabetes type II Mother    Hypertension Mother    Diabetes Mother    Colon cancer Neg Hx    Thyroid disease Neg Hx    Stomach cancer Neg Hx    Esophageal cancer Neg Hx    Rectal cancer Neg Hx     Past Surgical History:  Procedure Laterality Date   CATARACT EXTRACTION Bilateral    CHONDROPLASTY Left 04/01/2020   Procedure: CHONDROPLASTY;  Surgeon: Newt Minion, MD;  Location: [place] [facility];  Service: Orthopedics;   Laterality: Left;   COLONOSCOPY     KNEE ARTHROSCOPY WITH LATERAL MENISECTOMY Left 04/01/2020   Procedure: KNEE ARTHROSCOPY WITH LATERAL MENISECTOMY;  Surgeon: Newt Minion, MD;  Location: [facility];  Service: Orthopedics;  Laterality: Left;   KNEE ARTHROSCOPY WITH MEDIAL MENISECTOMY Left 04/01/2020   Procedure: LEFT KNEE ARTHROSCOPY  WITH MEDIAL AND LATERAL MENISECTOMIES, CHONDROPLASTY AND DEBRIDEMENT;  Surgeon: Newt Minion, MD;  Location: [facility];  Service: Orthopedics;  Laterality: Left;   ORIF FEMUR FRACTURE Right 07/24/2014   Procedure: OPEN REDUCTION INTERNAL FIXATION (ORIF) DISTAL FEMUR FRACTURE;  Surgeon: Newt Minion, MD;  Location: [facility];  Service: Orthopedics;  Laterality: Right;   ORIF PERIPROSTHETIC FRACTURE Right 07/02/2014   Procedure: Open Reduction Internal Fixation Femur Fracture, Revision Femur Total Hip Arthroplasty;  Surgeon: Newt Minion, MD;  Location: [facility];  Service: Orthopedics;  Laterality: Right;   ROTATOR CUFF REPAIR Right 09/19/2013   TOTAL HIP ARTHROPLASTY Right 06/19/2014   Procedure: TOTAL HIP ARTHROPLASTY;  Surgeon: Newt Minion, MD;  Location: [facility];  Service: Orthopedics;  Laterality: Right;   VAGINAL HYSTERECTOMY  03/23/1999   complete   Social History   Occupational History   Not on file  Tobacco Use   Smoking status: Former    Packs/day: 0.50    Years: 30.00    Total pack years: 15.00    Types: Cigarettes    Quit date: 11/20/2015    Years since quitting: 6.1   Smokeless tobacco: Never   Tobacco comments:    Patient reports that she is gradually trying to quit  Vaping Use   Vaping Use: Never used  Substance and Sexual Activity   Alcohol use: Yes    Alcohol/week: 14.0 standard drinks of alcohol    Types: 14 Glasses of wine per week    Comment: 2 glasses of wine at night   Drug use: No   Sexual activity: Not Currently    Birth control/protection: Surgical

## 2022-02-09 ENCOUNTER — Ambulatory Visit (INDEPENDENT_AMBULATORY_CARE_PROVIDER_SITE_OTHER): Payer: Medicare Other

## 2022-02-09 ENCOUNTER — Ambulatory Visit (INDEPENDENT_AMBULATORY_CARE_PROVIDER_SITE_OTHER): Payer: Medicare Other | Admitting: Orthopaedic Surgery

## 2022-02-09 VITALS — BP 136/79 | HR 67 | Ht 66.0 in | Wt 192.0 lb

## 2022-02-09 DIAGNOSIS — M79645 Pain in left finger(s): Secondary | ICD-10-CM

## 2022-02-09 DIAGNOSIS — M67442 Ganglion, left hand: Secondary | ICD-10-CM

## 2022-02-09 NOTE — Progress Notes (Signed)
Office Visit Note   Patient: Bianca Collins           Date of Birth: [birth year]           MRN: [id number] Visit Date: 02/09/2022              Requested by: Waunita Schooner, MD [address] [place],  [place] [address] PCP: Waunita Schooner, MD   Assessment & Plan: Visit Diagnoses:  1. Pain in left finger(s)   2. Ganglion cyst of finger of left hand     Plan: If the mass enlarges becomes symptomatic she can return.  Mass transilluminates and is consistent with a ganglion fluid-filled.  She can call and let us know if she like to have it removed.  Follow-Up Instructions: No follow-ups on file.   Orders:  Orders Placed This Encounter  Procedures   XR Finger Ring Left   No orders of the defined types were placed in this encounter.     Procedures: No procedures performed   Clinical Data: No additional findings.   Subjective: Chief Complaint  Patient presents with   Left Ring Finger - Pain    HPI 76 year old female with left ring finger mass which is radial dorsal between MCP and PIP joint.  Some days her finger is a little bit achy but does not tend to bother her with activity she is right-hand dominant.  She does like to do a lot of crocheting.  There really does not stop her from any activities.  Review of Systems patient had previous total hip arthroplasty.  Some problems with vertigo in the past and emphysematous lung changes.   Objective: Vital Signs: BP 136/79   Pulse 67   Ht '5\' 6"'$  (1.676 m)   Wt 192 lb (87.1 kg)   BMI 30.99 kg/m   Physical Exam Constitutional:      Appearance: She is well-developed.  HENT:     Head: Normocephalic.     Right Ear: External ear normal.     Left Ear: External ear normal. There is no impacted cerumen.  Eyes:     Pupils: Pupils are equal, round, and reactive to light.  Neck:     Thyroid: No thyromegaly.     Trachea: No tracheal deviation.  Cardiovascular:     Rate and Rhythm: Normal rate.  Pulmonary:     Effort:  Pulmonary effort is normal.  Abdominal:     Palpations: Abdomen is soft.  Musculoskeletal:     Cervical back: No rigidity.  Skin:    General: Skin is warm and dry.  Neurological:     Mental Status: She is alert and oriented to person, place, and time.  Psychiatric:        Behavior: Behavior normal.     Ortho Exam patient has patent palpable jelly filled type cyst dorsal radial finger between MCP and PIP joint.  Is adjacent to the extensor tendon but not adherent to it.  Cyst does not extend to either the PIP joint or MCP joint.  Flexor tendons are normal.  Specialty Comments:  No specialty comments available.  Imaging: XR Finger Ring Left  Result Date: 02/09/2022 AP lateral and oblique left ring finger x-rays obtained and reviewed.  This shows normal bony anatomy normal joints no soft tissue calcification. Impression: Left ring finger radiographs unremarkable.    PMFS History: Patient Active Problem List   Diagnosis Date Noted   Ganglion cyst of finger of left hand 02/09/2022   Emphysema  lung ([place]) 09/09/2021   Hormone replacement therapy (HRT) 09/09/2021   Benign paroxysmal positional vertigo 09/09/2021   Aortic atherosclerosis ([place]) 01/12/2019   H/O total hip arthroplasty 06/19/2014   Hx of adenomatous polyp of colon 06/13/2014   Hypothyroidism 06/06/2014   Past Medical History:  Diagnosis Date   Arthritis    Cataract 11/29/2016   rt eye   Dr Clent Jacks   Hx of adenomatous polyp of colon 06/13/2014   Hyperlipidemia    Hypothyroidism    Periprosthetic fracture around internal prosthetic right hip joint ([place]) 07/05/2014   Thyroid disease    Tobacco abuse     Family History  Problem Relation Age of Onset   Diabetes type II Mother    Hypertension Mother    Diabetes Mother    Colon cancer Neg Hx    Thyroid disease Neg Hx    Stomach cancer Neg Hx    Esophageal cancer Neg Hx    Rectal cancer Neg Hx     Past Surgical History:  Procedure Laterality Date    CATARACT EXTRACTION Bilateral    CHONDROPLASTY Left 04/01/2020   Procedure: CHONDROPLASTY;  Surgeon: Newt Minion, MD;  Location: [facility];  Service: Orthopedics;  Laterality: Left;   COLONOSCOPY     KNEE ARTHROSCOPY WITH LATERAL MENISECTOMY Left 04/01/2020   Procedure: KNEE ARTHROSCOPY WITH LATERAL MENISECTOMY;  Surgeon: Newt Minion, MD;  Location: [facility];  Service: Orthopedics;  Laterality: Left;   KNEE ARTHROSCOPY WITH MEDIAL MENISECTOMY Left 04/01/2020   Procedure: LEFT KNEE ARTHROSCOPY  WITH MEDIAL AND LATERAL MENISECTOMIES, CHONDROPLASTY AND DEBRIDEMENT;  Surgeon: Newt Minion, MD;  Location: [facility];  Service: Orthopedics;  Laterality: Left;   ORIF FEMUR FRACTURE Right 07/24/2014   Procedure: OPEN REDUCTION INTERNAL FIXATION (ORIF) DISTAL FEMUR FRACTURE;  Surgeon: Newt Minion, MD;  Location: [facility];  Service: Orthopedics;  Laterality: Right;   ORIF PERIPROSTHETIC FRACTURE Right 07/02/2014   Procedure: Open Reduction Internal Fixation Femur Fracture, Revision Femur Total Hip Arthroplasty;  Surgeon: Newt Minion, MD;  Location: [facility];  Service: Orthopedics;  Laterality: Right;   ROTATOR CUFF REPAIR Right 09/19/2013   TOTAL HIP ARTHROPLASTY Right 06/19/2014   Procedure: TOTAL HIP ARTHROPLASTY;  Surgeon: Newt Minion, MD;  Location: [facility];  Service: Orthopedics;  Laterality: Right;   VAGINAL HYSTERECTOMY  03/23/1999   complete   Social History   Occupational History   Not on file  Tobacco Use   Smoking status: Former    Packs/day: 0.50    Years: 30.00    Total pack years: 15.00    Types: Cigarettes    Quit date: 11/20/2015    Years since quitting: 6.2   Smokeless tobacco: Never   Tobacco comments:    Patient reports that she is gradually trying to quit  Vaping Use   Vaping Use: Never used  Substance and Sexual Activity   Alcohol use: Yes    Alcohol/week: 14.0 standard drinks of alcohol    Types: 14 Glasses of wine  per week    Comment: 2 glasses of wine at night   Drug use: No   Sexual activity: Not Currently    Birth control/protection: Surgical

## 2022-03-09 ENCOUNTER — Encounter: Payer: Medicare Other | Admitting: Family

## 2022-04-19 DIAGNOSIS — Z1231 Encounter for screening mammogram for malignant neoplasm of breast: Secondary | ICD-10-CM | POA: Diagnosis not present

## 2022-04-19 DIAGNOSIS — E039 Hypothyroidism, unspecified: Secondary | ICD-10-CM | POA: Diagnosis not present

## 2022-04-20 ENCOUNTER — Ambulatory Visit (INDEPENDENT_AMBULATORY_CARE_PROVIDER_SITE_OTHER): Payer: 59 | Admitting: Nurse Practitioner

## 2022-04-20 ENCOUNTER — Encounter: Payer: Self-pay | Admitting: Nurse Practitioner

## 2022-04-20 VITALS — BP 132/78 | HR 75 | Ht 66.0 in | Wt 198.0 lb

## 2022-04-20 DIAGNOSIS — H6123 Impacted cerumen, bilateral: Secondary | ICD-10-CM

## 2022-04-20 DIAGNOSIS — Z79890 Hormone replacement therapy (postmenopausal): Secondary | ICD-10-CM

## 2022-04-20 DIAGNOSIS — E038 Other specified hypothyroidism: Secondary | ICD-10-CM | POA: Diagnosis not present

## 2022-04-20 DIAGNOSIS — J438 Other emphysema: Secondary | ICD-10-CM

## 2022-04-20 DIAGNOSIS — E034 Atrophy of thyroid (acquired): Secondary | ICD-10-CM | POA: Diagnosis not present

## 2022-04-20 DIAGNOSIS — I700 Atherosclerosis of aorta: Secondary | ICD-10-CM

## 2022-04-20 LAB — COMPREHENSIVE METABOLIC PANEL
ALT: 12 U/L (ref 0–35)
AST: 12 U/L (ref 0–37)
Albumin: 4.1 g/dL (ref 3.5–5.2)
Alkaline Phosphatase: 53 U/L (ref 39–117)
BUN: 14 mg/dL (ref 6–23)
CO2: 29 mEq/L (ref 19–32)
Calcium: 9.2 mg/dL (ref 8.4–10.5)
Chloride: 103 mEq/L (ref 96–112)
Creatinine, Ser: 0.66 mg/dL (ref 0.40–1.20)
GFR: 86.55 mL/min (ref >60.00)
Glucose, Bld: 110 mg/dL — ABNORMAL HIGH (ref 70–99)
Potassium: 4.2 mEq/L (ref 3.5–5.1)
Sodium: 140 mEq/L (ref 135–145)
Total Bilirubin: 0.3 mg/dL (ref 0.2–1.2)
Total Protein: 6.8 g/dL (ref 6.0–8.3)

## 2022-04-20 LAB — CBC
HCT: 40.6 % (ref 36.0–46.0)
Hemoglobin: 13.8 g/dL (ref 12.0–15.0)
MCHC: 34 g/dL (ref 30.0–36.0)
MCV: 95.5 fl (ref 78.0–100.0)
Platelets: 270 10*3/uL (ref 150.0–400.0)
RBC: 4.25 Mil/uL (ref 3.87–5.11)
RDW: 13 % (ref 11.5–15.5)
WBC: 9.9 10*3/uL (ref 4.0–10.5)

## 2022-04-20 LAB — TSH: TSH: 1.54 u[IU]/mL (ref 0.35–5.50)

## 2022-04-20 MED ORDER — LEVOTHYROXINE SODIUM 88 MCG PO TABS: ORAL_TABLET | ORAL | 1 refills | Status: DC

## 2022-04-20 NOTE — Assessment & Plan Note (Signed)
Patient currently maintained on atorvastatin 10 mg.  Tolerating medication well.  She has gained a little weight over the holidays.  Continue work on healthy lifestyle modifications continue atorvastatin as prescribed.

## 2022-04-20 NOTE — Assessment & Plan Note (Signed)
Patient currently maintained on levothyroxine 88 mcg daily.  New prescription sent in for patient today.  Pending labs

## 2022-04-20 NOTE — Progress Notes (Signed)
Established Patient Office Visit  Subjective   Patient ID: Bianca Collins, female    DOB: [birth year]  Age: 77 y.o. MRN: [id number]  Chief Complaint  Patient presents with   Establish Care    HPI   Transfer of Care: last seen by Waunita Schooner, MD on 09/09/2021 Medicare annual wellness visit 01/11/2022   Hypothyroidism: stable on thyroid medication. States that she has never had an US of the thyroid that she is aware of   HLD: tolerating atorvastatin well Diet: states that she will eat brekfast (normally poptarts) later lunch and good dinner. States that son in law is helping with healthy dinner with veggies. Drinking water throughout of the day. States that a diet mtn dew once every 2 months Exercise: states she tires 3 times a week. She will do weight machines 37mn-45min  HRT: every day with the estridialol.  Emphysema: noted on LDCT chest. Recommend yearly CT due 07/2022  Tdap: unsure. Can get at local pharmacy  Flu: refused Covid: pfizer x2 PTDD:UKGURKYShingles: utd  Colonoscopy: 2023 repeat in 3 years 2026. Done with CSilvano Rusk MD Pap smear: hysterectomy, [facility]  DEXA: yesterday (04/19/2022) [facility]. AJulien GirtMammogram :[facility]. Yesterday 04/19/2022     Review of Systems  Constitutional:  Negative for chills and fever.  Respiratory:  Negative for shortness of breath.   Cardiovascular:  Positive for leg swelling (in the evening). Negative for chest pain.  Gastrointestinal:  Negative for abdominal pain, blood in stool, constipation, diarrhea, nausea and vomiting.       Bm daily   Genitourinary:  Negative for dysuria and hematuria.  Neurological:  Negative for tingling and headaches.  Psychiatric/Behavioral:  Negative for hallucinations and suicidal ideas.       Objective:     BP 132/78   Pulse 75   Ht '5\' 6"'$  (1.676 m)   Wt 198 lb (89.8 kg)   SpO2 98%   BMI 31.96 kg/m  BP Readings from Last 3  Encounters:  04/20/22 132/78  02/09/22 136/79  12/09/21 126/63   Wt Readings from Last 3 Encounters:  04/20/22 198 lb (89.8 kg)  02/09/22 192 lb (87.1 kg)  01/11/22 193 lb (87.5 kg)      Physical Exam Vitals and nursing note reviewed.  Constitutional:      Appearance: Normal appearance.  HENT:     Right Ear: Ear canal and external ear normal. There is impacted cerumen.     Left Ear: Ear canal and external ear normal. There is impacted cerumen.     Mouth/Throat:     Mouth: Mucous membranes are moist.     Pharynx: Oropharynx is clear.  Eyes:     Extraocular Movements: Extraocular movements intact.     Pupils: Pupils are equal, round, and reactive to light.  Cardiovascular:     Rate and Rhythm: Normal rate and regular rhythm.     Pulses: Normal pulses.     Heart sounds: Normal heart sounds.  Pulmonary:     Effort: Pulmonary effort is normal.     Breath sounds: Normal breath sounds.  Musculoskeletal:     Right lower leg: No edema.     Left lower leg: No edema.  Lymphadenopathy:     Cervical: No cervical adenopathy.  Skin:    General: Skin is warm.  Neurological:     General: No focal deficit present.     Mental Status: She is alert.  Deep Tendon Reflexes:     Reflex Scores:      Bicep reflexes are 2+ on the right side and 2+ on the left side.      Patellar reflexes are 2+ on the right side and 2+ on the left side.    Comments: Bilateral upper and lower extremity strength 5/5  Psychiatric:        Mood and Affect: Mood normal.        Behavior: Behavior normal.        Thought Content: Thought content normal.        Judgment: Judgment normal.      No results found for any visits on 04/20/22.    The 10-year ASCVD risk score (Arnett DK, et al., 2019) is: 15.1%    Assessment & Plan:   Problem List Items Addressed This Visit       Cardiovascular and Mediastinum   Aortic atherosclerosis ([place])    Patient currently maintained on atorvastatin 10 mg.   Tolerating medication well.  She has gained a little weight over the holidays.  Continue work on healthy lifestyle modifications continue atorvastatin as prescribed.      Relevant Orders   Comprehensive metabolic panel     Respiratory   Emphysema lung ([place])    Noticed on LDCT, stable.      Relevant Orders   CBC     Endocrine   Hypothyroidism    Patient currently maintained on levothyroxine 88 mcg daily.  New prescription sent in for patient today.  Pending labs      Relevant Medications   levothyroxine (SYNTHROID) 88 MCG tablet   Other Relevant Orders   TSH     Nervous and Auditory   Bilateral impacted cerumen - Primary    Verbal consent obtained.  Patient was prepped per office policy.  A mixture of water and hydrogen peroxide was used in bilateral ears were irrigated.  Patient did not tolerate procedure well she complained of pain without removal of impaction.  I went in and remove the impaction is using a curette.  Patient tolerated procedure well signs and symptoms reviewed to look out for inclusive of sore ear canal.  Patient is hearing was much improved when impaction was removed.      Relevant Orders   Ear Lavage     Other   Hormone replacement therapy (HRT)    Patient is being followed by Dr. Julien Girt from physician for limits.  She manages patient's HRT.  Patient is still taking 1 mg estradiol daily with plans of tapering down with her GYN.      Relevant Orders   CBC    Return in about 6 months (around 10/19/2022) for CPE and Labs.    Romilda Garret, NP

## 2022-04-20 NOTE — Assessment & Plan Note (Signed)
Noticed on LDCT, stable.

## 2022-04-20 NOTE — Assessment & Plan Note (Signed)
Patient is being followed by Dr. Julien Girt from physician for limits.  She manages patient's HRT.  Patient is still taking 1 mg estradiol daily with plans of tapering down with her GYN.

## 2022-04-20 NOTE — Assessment & Plan Note (Signed)
Verbal consent obtained.  Patient was prepped per office policy.  A mixture of water and hydrogen peroxide was used in bilateral ears were irrigated.  Patient did not tolerate procedure well she complained of pain without removal of impaction.  I went in and remove the impaction is using a curette.  Patient tolerated procedure well signs and symptoms reviewed to look out for inclusive of sore ear canal.  Patient is hearing was much improved when impaction was removed.

## 2022-04-20 NOTE — Patient Instructions (Signed)
Nice to see you today I will be in touch with the labs once I have the results Follow up with me in 6 months for a physical and labs

## 2022-04-21 ENCOUNTER — Other Ambulatory Visit: Payer: Self-pay | Admitting: Obstetrics and Gynecology

## 2022-04-21 DIAGNOSIS — R928 Other abnormal and inconclusive findings on diagnostic imaging of breast: Secondary | ICD-10-CM

## 2022-04-29 ENCOUNTER — Other Ambulatory Visit: Payer: Self-pay | Admitting: Obstetrics and Gynecology

## 2022-04-29 ENCOUNTER — Ambulatory Visit
Admission: RE | Admit: 2022-04-29 | Discharge: 2022-04-29 | Disposition: A | Discharge status: Home / Self Care | Payer: 59 | Source: Ambulatory Visit | Attending: Obstetrics and Gynecology | Admitting: Obstetrics and Gynecology

## 2022-04-29 DIAGNOSIS — R922 Inconclusive mammogram: Secondary | ICD-10-CM | POA: Diagnosis not present

## 2022-04-29 DIAGNOSIS — R928 Other abnormal and inconclusive findings on diagnostic imaging of breast: Secondary | ICD-10-CM

## 2022-04-29 DIAGNOSIS — N6489 Other specified disorders of breast: Secondary | ICD-10-CM

## 2022-04-30 ENCOUNTER — Telehealth: Payer: Self-pay | Admitting: Nurse Practitioner

## 2022-04-30 NOTE — Telephone Encounter (Signed)
Patient called back in returning a call she received.

## 2022-04-30 NOTE — Telephone Encounter (Signed)
Called and gave pt results of lab

## 2022-05-07 IMAGING — CT CT CHEST LUNG CANCER SCREENING LOW DOSE W/O CM
1 of 2 series · 15 of 33 positions shown, 19 images · non-contrast
Comparison: 01/12/2019

CLINICAL DATA: 35 pack-year smoking history, quitting 7 years ago.



[Series 4: super d · axial · 0.69mm/px · z∈[-372,-99]mm · 15 of 376 slices shown, 19 images]
[im 17/376  mediastinal]
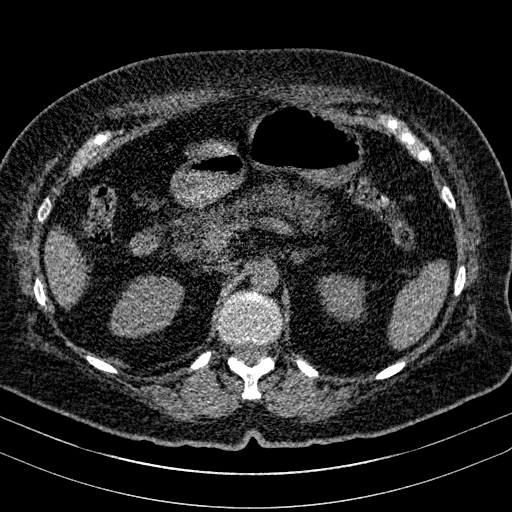
[im 17/376  lung]
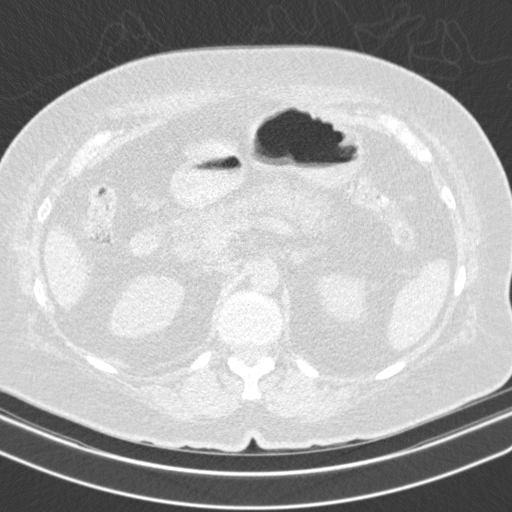
[im 49/376  lung]
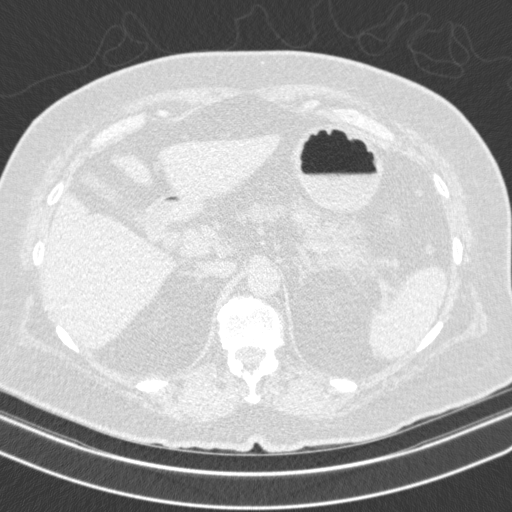
[im 82/376  lung]
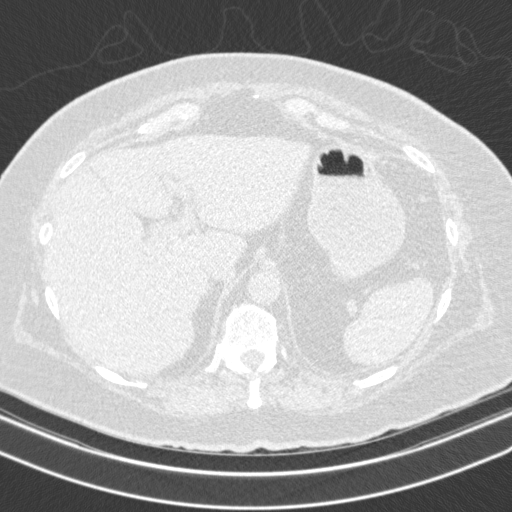
[im 98/376  lung]
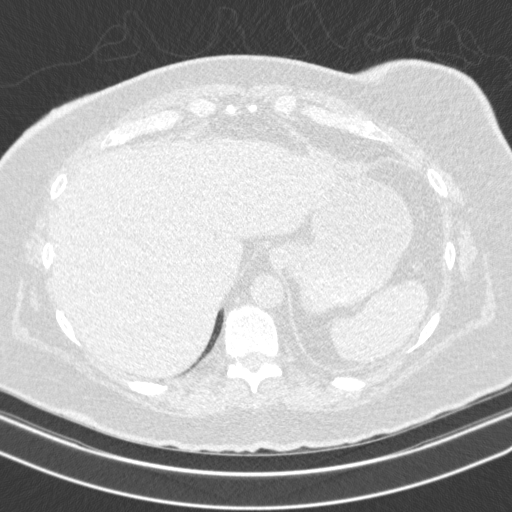
[im 115/376  mediastinal]
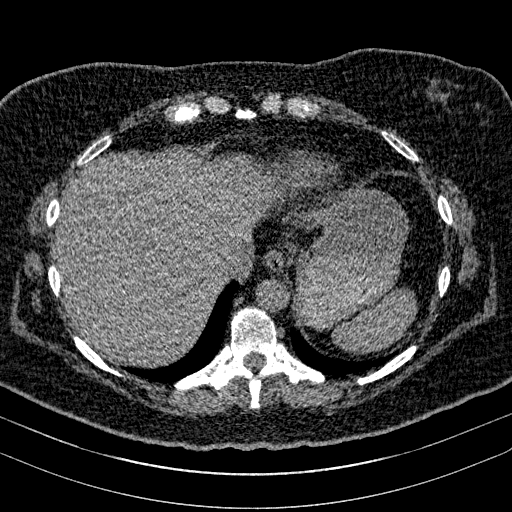
[im 115/376  lung]
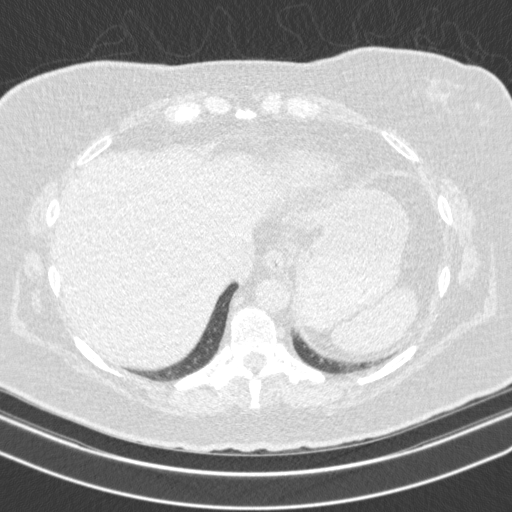
[im 147/376  lung]
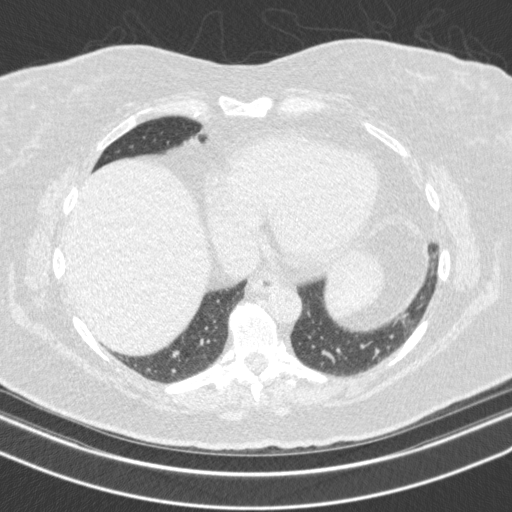
[im 176/376  lung]
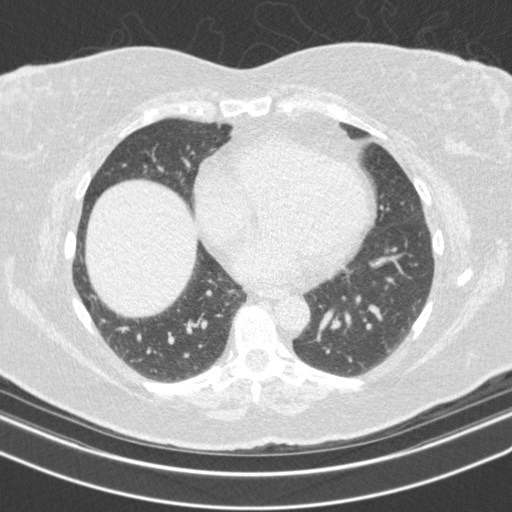
[im 188/376  lung]
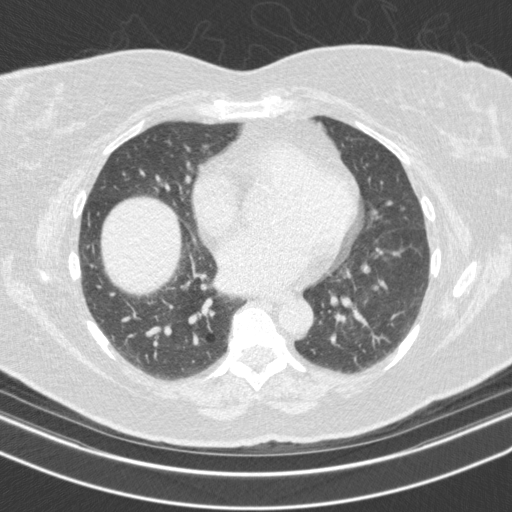
[im 196/376  mediastinal]
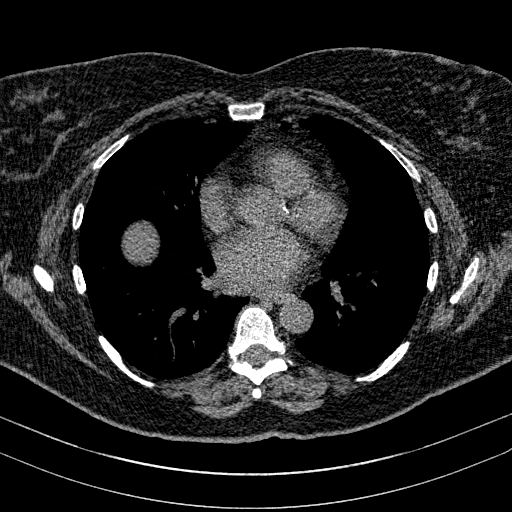
[im 196/376  lung]
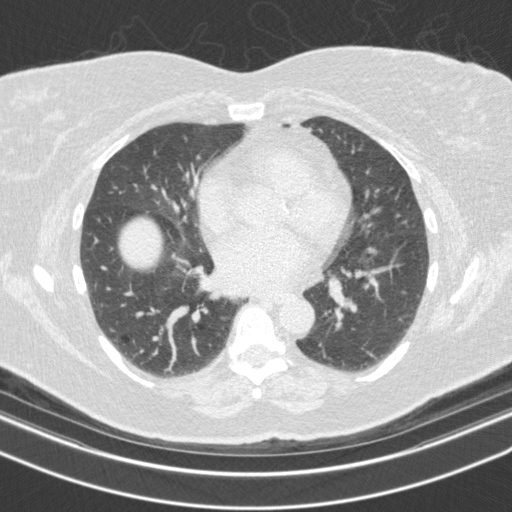
[im 229/376  lung]
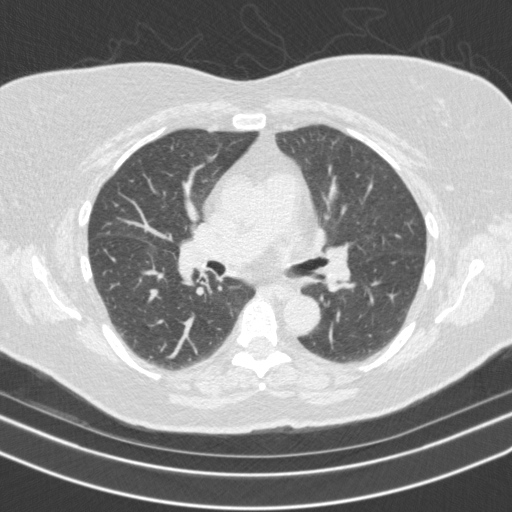
[im 261/376  lung]
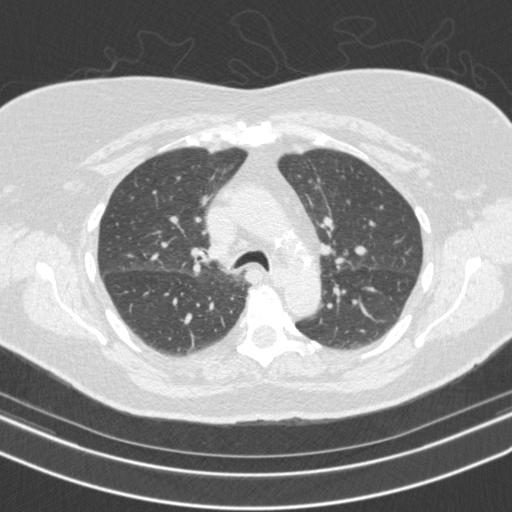
[im 278/376  lung]
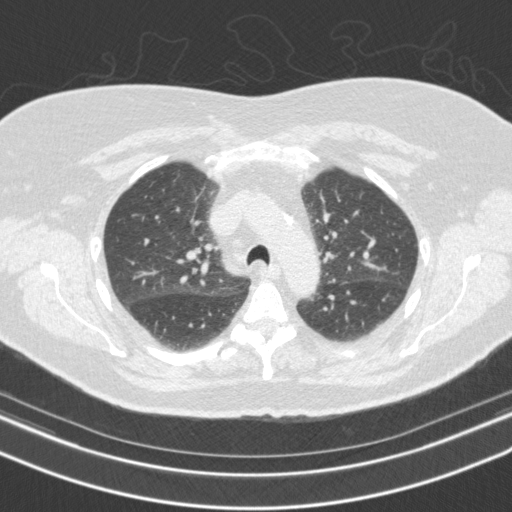
[im 294/376  mediastinal]
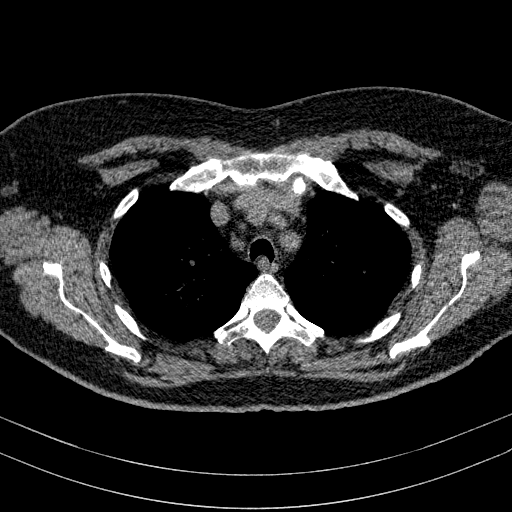
[im 294/376  lung]
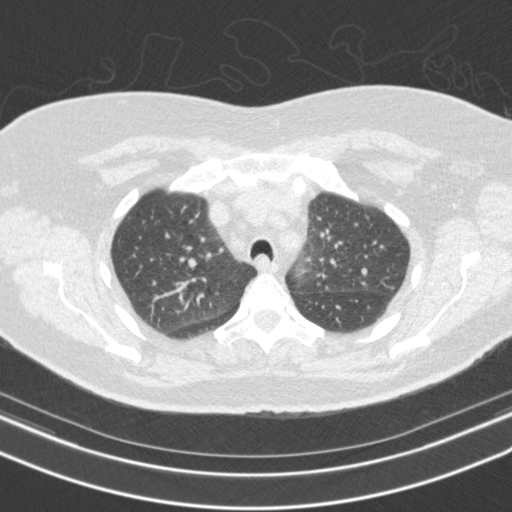
[im 327/376  lung]
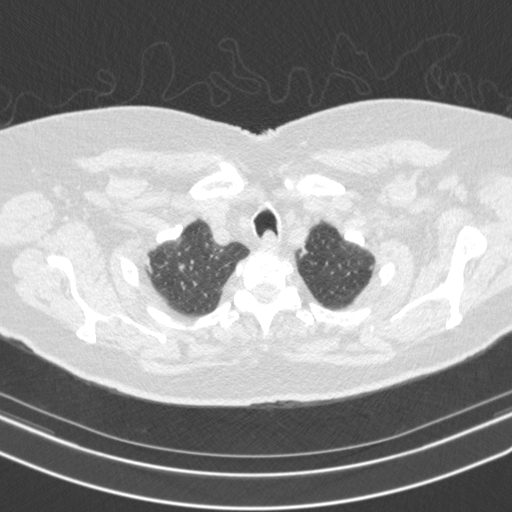
[im 359/376  lung]
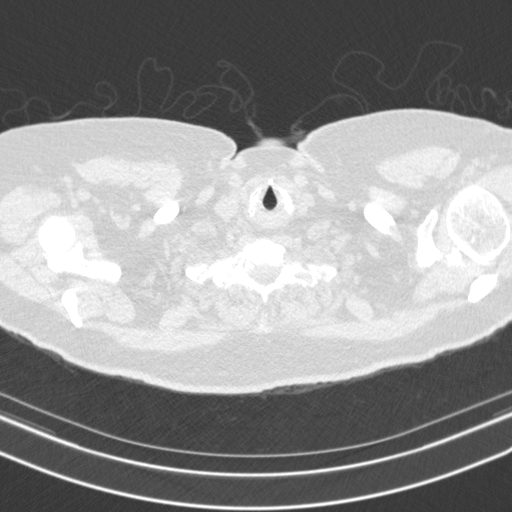

[15 of 33 positions shown; findings below may reference images not displayed]

FINDINGS: Cardiovascular: Aortic atherosclerosis. Normal heart size, without
pericardial effusion. Lad coronary artery calcification.

Mediastinum/Nodes: No mediastinal or definite hilar adenopathy,
given limitations of unenhanced CT.

Lungs/Pleura: No pleural fluid. Mild centrilobular emphysema.
Right-sided pulmonary nodules of maximally volume derived equivalent
diameter 5.6 mm. Right upper lobe calcified granuloma.

Upper Abdomen: Normal imaged portions of the liver, spleen, stomach,
pancreas, adrenal glands, kidneys.

Musculoskeletal: No acute osseous abnormality.
IMPRESSION: 1. Lung-RADS 2, benign appearance or behavior. Continue annual
screening with low-dose chest CT without contrast in 12 months.
2. Aortic Atherosclerosis (KZY15-HI5.5) and Emphysema (KZY15-8EG.I).
Coronary artery atherosclerosis.

## 2022-06-29 ENCOUNTER — Ambulatory Visit (INDEPENDENT_AMBULATORY_CARE_PROVIDER_SITE_OTHER): Payer: 59 | Admitting: Family

## 2022-06-29 ENCOUNTER — Encounter: Payer: Self-pay | Admitting: Family

## 2022-06-29 DIAGNOSIS — M1712 Unilateral primary osteoarthritis, left knee: Secondary | ICD-10-CM | POA: Diagnosis not present

## 2022-06-29 MED ORDER — METHYLPREDNISOLONE ACETATE 40 MG/ML IJ SUSP
40.0000 mg | INTRAMUSCULAR | Status: AC | PRN
  Administered 2022-06-29: 40 mg via INTRA_ARTICULAR

## 2022-06-29 MED ORDER — LIDOCAINE HCL 1 % IJ SOLN
5.0000 mL | INTRAMUSCULAR | Status: AC | PRN
  Administered 2022-06-29: 5 mL

## 2022-06-29 NOTE — Progress Notes (Signed)
Office Visit Note   Patient: Bianca Collins           Date of Birth: Jul 30, 1945           MRN: [id number] Visit Date: 06/29/2022              Requested by: Eden Emms, NP [address] [place],  [place] [address] PCP: Eden Emms, NP  Chief Complaint  Patient presents with   Left Knee - Pain       HPI: The patient is a 77 year old woman who presents today in follow-up for arthritic pain to the left knee.  She last had a Depo-Medrol injection November of last year has been doing quite well since then.  She however has had return of her symptoms of the left knee.  Requests repeat injection.  she complains of start up stiffness and deep knee pain no falls no giving way  Assessment & Plan: Visit Diagnoses: No diagnosis found.  Plan: Depo-Medrol injection left knee today.  Patient tolerated well.  She will follow-up as needed.  Follow-Up Instructions: No follow-ups on file.   Left Knee Exam   Muscle Strength  The patient has normal left knee strength.  Tenderness  The patient is experiencing tenderness in the medial joint line.  Range of Motion  The patient has normal left knee ROM.  Other  Effusion: no effusion present      Patient is alert, oriented, no adenopathy, well-dressed, normal affect, normal respiratory effort.   Imaging: No results found. No images are attached to the encounter.  Labs: No results found for: "HGBA1C", "ESRSEDRATE", "CRP", "LABURIC", "REPTSTATUS", "GRAMSTAIN", "CULT", "LABORGA"   Lab Results  Component Value Date   ALBUMIN 4.1 04/20/2022   ALBUMIN 4.0 05/28/2015   ALBUMIN 4.0 06/10/2014    No results found for: "MG" No results found for: "VD25OH"  No results found for: "PREALBUMIN"    Latest Ref Rng & Units 04/20/2022   11:06 AM 05/25/2021   11:30 AM 10/12/2019   10:35 AM  CBC EXTENDED  WBC 4.0 - 10.5 K/uL 9.9  9.1  9.7   RBC 3.87 - 5.11 Mil/uL 4.25  4.20  4.31   Hemoglobin 12.0 - 15.0 g/dL 14.1  03.0  13.1    HCT 36.0 - 46.0 % 40.6  40.1  41.2   Platelets 150.0 - 400.0 K/uL 270.0  256  275   NEUT# 1,500 - 7,800 cells/uL  5,678  6,645   Lymph# 850 - 3,900 cells/uL  2,348  2,018      There is no height or weight on file to calculate BMI.  Orders:  No orders of the defined types were placed in this encounter.  No orders of the defined types were placed in this encounter.    Procedures: Large Joint Inj: L knee on 06/29/2022 11:05 AM Indications: pain Details: 18 G 1.5 in needle, anteromedial approach Medications: 5 mL lidocaine 1 %; 40 mg methylPREDNISolone acetate 40 MG/ML Consent was given by the patient.      Clinical Data: No additional findings.  ROS:  All other systems negative, except as noted in the HPI. Review of Systems  Objective: Vital Signs: There were no vitals taken for this visit.  Specialty Comments:  No specialty comments available.  PMFS History: Patient Active Problem List   Diagnosis Date Noted   Bilateral impacted cerumen 04/20/2022   Ganglion cyst of finger of left hand 02/09/2022   Emphysema lung 09/09/2021  Hormone replacement therapy (HRT) 09/09/2021   Benign paroxysmal positional vertigo 09/09/2021   Aortic atherosclerosis 01/12/2019   H/O total hip arthroplasty 06/19/2014   Hx of adenomatous polyp of colon 06/13/2014   Hypothyroidism 06/06/2014   Past Medical History:  Diagnosis Date   Arthritis    Cataract 11/29/2016   rt eye   Dr Ernesto Rutherford   Hx of adenomatous polyp of colon 06/13/2014   Hyperlipidemia    Hypothyroidism    Periprosthetic fracture around internal prosthetic right hip joint (HCC) 07/05/2014   Thyroid disease    Tobacco abuse     Family History  Problem Relation Age of Onset   Diabetes type II Mother    Hypertension Mother    Diabetes Mother    Colon cancer Neg Hx    Thyroid disease Neg Hx    Stomach cancer Neg Hx    Esophageal cancer Neg Hx    Rectal cancer Neg Hx     Past Surgical History:  Procedure  Laterality Date   CATARACT EXTRACTION Bilateral    CHONDROPLASTY Left 04/01/2020   Procedure: CHONDROPLASTY;  Surgeon: Nadara Mustard, MD;  Location: [facility];  Service: Orthopedics;  Laterality: Left;   COLONOSCOPY     KNEE ARTHROSCOPY WITH LATERAL MENISECTOMY Left 04/01/2020   Procedure: KNEE ARTHROSCOPY WITH LATERAL MENISECTOMY;  Surgeon: Nadara Mustard, MD;  Location: [facility];  Service: Orthopedics;  Laterality: Left;   KNEE ARTHROSCOPY WITH MEDIAL MENISECTOMY Left 04/01/2020   Procedure: LEFT KNEE ARTHROSCOPY  WITH MEDIAL AND LATERAL MENISECTOMIES, CHONDROPLASTY AND DEBRIDEMENT;  Surgeon: Nadara Mustard, MD;  Location: [facility];  Service: Orthopedics;  Laterality: Left;   ORIF FEMUR FRACTURE Right 07/24/2014   Procedure: OPEN REDUCTION INTERNAL FIXATION (ORIF) DISTAL FEMUR FRACTURE;  Surgeon: Nadara Mustard, MD;  Location: [facility];  Service: Orthopedics;  Laterality: Right;   ORIF PERIPROSTHETIC FRACTURE Right 07/02/2014   Procedure: Open Reduction Internal Fixation Femur Fracture, Revision Femur Total Hip Arthroplasty;  Surgeon: Nadara Mustard, MD;  Location: [facility];  Service: Orthopedics;  Laterality: Right;   ROTATOR CUFF REPAIR Right 09/19/2013   TOTAL HIP ARTHROPLASTY Right 06/19/2014   Procedure: TOTAL HIP ARTHROPLASTY;  Surgeon: Nadara Mustard, MD;  Location: [facility];  Service: Orthopedics;  Laterality: Right;   VAGINAL HYSTERECTOMY  03/23/1999   complete   Social History   Occupational History   Not on file  Tobacco Use   Smoking status: Former    Packs/day: 0.50    Years: 30.00    Additional pack years: 0.00    Total pack years: 15.00    Types: Cigarettes    Quit date: 11/20/2015    Years since quitting: 6.6   Smokeless tobacco: Never   Tobacco comments:    Patient reports that she is gradually trying to quit  Vaping Use   Vaping Use: Never used  Substance and Sexual Activity   Alcohol use: Yes    Alcohol/week: 14.0  standard drinks of alcohol    Types: 14 Glasses of wine per week    Comment: 2 glasses of wine at night   Drug use: No   Sexual activity: Not Currently    Birth control/protection: Surgical

## 2022-09-22 ENCOUNTER — Other Ambulatory Visit: Payer: Self-pay

## 2022-09-22 DIAGNOSIS — I700 Atherosclerosis of aorta: Secondary | ICD-10-CM

## 2022-09-22 MED ORDER — ATORVASTATIN CALCIUM 10 MG PO TABS: 10.0000 mg | ORAL_TABLET | Freq: Every day | ORAL | 3 refills | Status: DC

## 2022-09-22 NOTE — Telephone Encounter (Signed)
Faxed refill request from [facility]

## 2022-10-20 ENCOUNTER — Other Ambulatory Visit: Payer: Self-pay | Admitting: Podiatry

## 2022-10-20 ENCOUNTER — Ambulatory Visit (INDEPENDENT_AMBULATORY_CARE_PROVIDER_SITE_OTHER): Payer: 59

## 2022-10-20 ENCOUNTER — Encounter: Payer: Self-pay | Admitting: Podiatry

## 2022-10-20 ENCOUNTER — Ambulatory Visit (INDEPENDENT_AMBULATORY_CARE_PROVIDER_SITE_OTHER): Payer: 59 | Admitting: Podiatry

## 2022-10-20 DIAGNOSIS — G629 Polyneuropathy, unspecified: Secondary | ICD-10-CM

## 2022-10-20 DIAGNOSIS — M79671 Pain in right foot: Secondary | ICD-10-CM

## 2022-10-20 DIAGNOSIS — M79672 Pain in left foot: Secondary | ICD-10-CM | POA: Diagnosis not present

## 2022-10-20 NOTE — Progress Notes (Signed)
Subjective:   Patient ID: Bianca Collins, female   DOB: 77 y.o.   MRN: [id number]   HPI Patient presents stating that she has had tingling and burning big toe second toe third toe both feet does not remember injury has been going on for around 6 months worse at nighttime and when trying to sleep.  Patient does not currently smoke tries to be active   Review of Systems  All other systems reviewed and are negative.       Objective:  Physical Exam Vitals and nursing note reviewed.  Constitutional:      Appearance: She is well-developed.  Pulmonary:     Effort: Pulmonary effort is normal.  Musculoskeletal:        General: Normal range of motion.  Skin:    General: Skin is warm.  Neurological:     Mental Status: She is alert.     Neurovascular status found to be intact check sharp dull vibratory found to be within normal limits I did not note range of motion loss muscle strength loss with no exquisite discomfort when I pressed into the lesser MPJs with no balance issues of a significant fashion currently.  Good digital perfusion well-oriented     Assessment:  Possibility for neuropraxia or may be stress on the back versus other pathology or possibility for neuropathy     Plan:  H&P x-ray taken education concerning neuropathy given she is starting again in the last 10 days vitamin B supplements and I want her to do that on a continuing basis and ultimately may require gabapentin at nighttime.  Patient will be seen back as symptoms indicate or contact me  X-rays were negative for signs of arthritis times of stress fracture or other bony pathology that could be part of this path and experience that she has

## 2022-10-21 ENCOUNTER — Encounter: Payer: Self-pay | Admitting: Nurse Practitioner

## 2022-10-21 ENCOUNTER — Ambulatory Visit (INDEPENDENT_AMBULATORY_CARE_PROVIDER_SITE_OTHER): Payer: 59 | Admitting: Nurse Practitioner

## 2022-10-21 VITALS — BP 132/76 | HR 69 | Temp 97.6°F | Ht 66.0 in | Wt 197.0 lb

## 2022-10-21 DIAGNOSIS — R202 Paresthesia of skin: Secondary | ICD-10-CM | POA: Diagnosis not present

## 2022-10-21 DIAGNOSIS — Z0000 Encounter for general adult medical examination without abnormal findings: Secondary | ICD-10-CM

## 2022-10-21 DIAGNOSIS — E669 Obesity, unspecified: Secondary | ICD-10-CM

## 2022-10-21 DIAGNOSIS — I700 Atherosclerosis of aorta: Secondary | ICD-10-CM | POA: Diagnosis not present

## 2022-10-21 DIAGNOSIS — E034 Atrophy of thyroid (acquired): Secondary | ICD-10-CM | POA: Diagnosis not present

## 2022-10-21 DIAGNOSIS — Z87891 Personal history of nicotine dependence: Secondary | ICD-10-CM

## 2022-10-21 DIAGNOSIS — R7309 Other abnormal glucose: Secondary | ICD-10-CM

## 2022-10-21 DIAGNOSIS — Z122 Encounter for screening for malignant neoplasm of respiratory organs: Secondary | ICD-10-CM | POA: Diagnosis not present

## 2022-10-21 DIAGNOSIS — Z79890 Hormone replacement therapy (postmenopausal): Secondary | ICD-10-CM

## 2022-10-21 LAB — LIPID PANEL
Cholesterol: 153 mg/dL (ref 0–200)
HDL: 60.9 mg/dL (ref >39.00)
LDL Cholesterol: 69 mg/dL (ref 0–99)
NonHDL: 92.15
Total CHOL/HDL Ratio: 3
Triglycerides: 118 mg/dL (ref 0.0–149.0)
VLDL: 23.6 mg/dL (ref 0.0–40.0)

## 2022-10-21 LAB — CBC
HCT: 41.1 % (ref 36.0–46.0)
Hemoglobin: 13.7 g/dL (ref 12.0–15.0)
MCHC: 33.4 g/dL (ref 30.0–36.0)
MCV: 95.1 fl (ref 78.0–100.0)
Platelets: 264 10*3/uL (ref 150.0–400.0)
RBC: 4.32 Mil/uL (ref 3.87–5.11)
RDW: 13.5 % (ref 11.5–15.5)
WBC: 8.5 10*3/uL (ref 4.0–10.5)

## 2022-10-21 LAB — COMPREHENSIVE METABOLIC PANEL
ALT: 14 U/L (ref 0–35)
AST: 15 U/L (ref 0–37)
Albumin: 4.1 g/dL (ref 3.5–5.2)
Alkaline Phosphatase: 57 U/L (ref 39–117)
BUN: 15 mg/dL (ref 6–23)
CO2: 29 mEq/L (ref 19–32)
Calcium: 9.4 mg/dL (ref 8.4–10.5)
Chloride: 102 mEq/L (ref 96–112)
Creatinine, Ser: 0.75 mg/dL (ref 0.40–1.20)
GFR: 78.28 mL/min (ref >60.00)
Glucose, Bld: 154 mg/dL — ABNORMAL HIGH (ref 70–99)
Potassium: 4.3 mEq/L (ref 3.5–5.1)
Sodium: 138 mEq/L (ref 135–145)
Total Bilirubin: 0.3 mg/dL (ref 0.2–1.2)
Total Protein: 6.8 g/dL (ref 6.0–8.3)

## 2022-10-21 LAB — URINALYSIS, MICROSCOPIC ONLY

## 2022-10-21 LAB — VITAMIN B12: Vitamin B-12: 626 pg/mL (ref 211–911)

## 2022-10-21 LAB — TSH: TSH: 1.13 u[IU]/mL (ref 0.35–5.50)

## 2022-10-21 MED ORDER — LEVOTHYROXINE SODIUM 88 MCG PO TABS: ORAL_TABLET | ORAL | 1 refills | Status: DC

## 2022-10-21 NOTE — Assessment & Plan Note (Signed)
Patient currently maintained on estradiol 1 mg daily managed by Dr. Zelphia Cairo, GYN.  Continue taking medication as prescribed follow-up with specialist as recommended

## 2022-10-21 NOTE — Progress Notes (Signed)
Established Patient Office Visit  Subjective   Patient ID: Bianca Collins, female    DOB: March 21, 1946  Age: 77 y.o. MRN: [id number]  Chief Complaint  Patient presents with   Annual Exam    HPI  for complete physical and follow up of chronic conditions.   HRT: Patient currently maintained on estradiol 1 mg this is prescribed by her GYN.  Hypothyroidism: Patient currently maintained on levothyroxine 88 mcg daily.  Immunizations: -Tetanus: Completed in unsure.  Can get at local pharmacy -Influenza: Out of season -Shingles: Completed Shingrix series -Pneumonia: refused   Diet: Fair diet.  States that she will do pop tarts booster and then supper. States that she will drink water. On occasion a diet mtn dew.   Exercise: [employer] 3 times a week 20-45 mins she will do cardio and weight s  Eye exam:  cataract removal. Yearly and reading glasses  Dental exam: PRN  Colonoscopy: Completed in 12/09/2021, recall 3 years Lung Cancer Screening: Ambulatory role to LDCT program patient is overdue  Pap smear: aged out, followed by GYN  Mammogram:last done 04/29/2022, diagnostic with [place]. Recommend a 6 month follow up   Dexa: in GYN office, up-to-date per patient report  Sleep: state that she will go to sleep around 12-1 and she has a hard time sleeping most nights. States that she will sleep for 1.5 hours and wake up. States that she feels rested most times. She will get up around 8-9. States that she does snore        Review of Systems  Constitutional:  Negative for chills and fever.  Respiratory:  Negative for shortness of breath.   Cardiovascular:  Negative for chest pain and leg swelling.  Gastrointestinal:  Negative for abdominal pain, blood in stool, constipation, diarrhea, nausea and vomiting.       BM daily   Genitourinary:  Negative for dysuria and hematuria.  Neurological:  Positive for tingling (toe seen by podiatry). Negative for headaches.  Psychiatric/Behavioral:  Negative  for hallucinations and suicidal ideas.       Objective:     BP 132/76   Pulse 69   Temp 97.6 F (36.4 C) (Temporal)   Ht 5\' 6"  (1.676 m)   Wt 197 lb (89.4 kg)   SpO2 94%   BMI 31.80 kg/m  BP Readings from Last 3 Encounters:  10/21/22 132/76  04/20/22 132/78  02/09/22 136/79   Wt Readings from Last 3 Encounters:  10/21/22 197 lb (89.4 kg)  04/20/22 198 lb (89.8 kg)  02/09/22 192 lb (87.1 kg)      Physical Exam Vitals and nursing note reviewed.  Constitutional:      Appearance: Normal appearance.  HENT:     Right Ear: Tympanic membrane, ear canal and external ear normal.     Left Ear: Tympanic membrane, ear canal and external ear normal.     Mouth/Throat:     Mouth: Mucous membranes are moist.     Pharynx: Oropharynx is clear.  Eyes:     Extraocular Movements: Extraocular movements intact.     Pupils: Pupils are equal, round, and reactive to light.  Cardiovascular:     Rate and Rhythm: Normal rate and regular rhythm.     Pulses: Normal pulses.     Heart sounds: Normal heart sounds.  Pulmonary:     Effort: Pulmonary effort is normal.     Breath sounds: Normal breath sounds.  Abdominal:     General: Bowel sounds are normal. There is  no distension.     Palpations: There is no mass.     Tenderness: There is no abdominal tenderness.     Hernia: No hernia is present.  Musculoskeletal:     Right lower leg: No edema.     Left lower leg: No edema.  Lymphadenopathy:     Cervical: No cervical adenopathy.  Skin:    General: Skin is warm.  Neurological:     General: No focal deficit present.     Mental Status: She is alert.     Deep Tendon Reflexes:     Reflex Scores:      Bicep reflexes are 2+ on the right side and 2+ on the left side.      Patellar reflexes are 2+ on the right side and 2+ on the left side.    Comments: Bilateral upper and lower extremity strength 5/5  Psychiatric:        Mood and Affect: Mood normal.        Behavior: Behavior normal.         Thought Content: Thought content normal.        Judgment: Judgment normal.      No results found for any visits on 10/21/22.    The 10-year ASCVD risk score (Arnett DK, et al., 2019) is: 15.1%    Assessment & Plan:   Problem List Items Addressed This Visit       Cardiovascular and Mediastinum   Aortic atherosclerosis (HCC)    Patient currently maintained on atorvastatin 10 mg tolerating medication well pending lipid panel today continue medication as prescribed      Relevant Orders   Lipid panel     Endocrine   Hypothyroidism    Patient currently maintained on levothyroxine 88 mcg daily.  Tolerates medication well.  Pending TSH continue levothyroxine as prescribed      Relevant Medications   levothyroxine (SYNTHROID) 88 MCG tablet   Other Relevant Orders   TSH     Other   Hormone replacement therapy (HRT)    Patient currently maintained on estradiol 1 mg daily managed by Dr. Zelphia Cairo, GYN.  Continue taking medication as prescribed follow-up with specialist as recommended      Preventative health care - Primary    Discussed age-appropriate immunizations and screening exams.  Patient is up-to-date on all age-appropriate vaccinations that she would like.  Did review patient's personal, surgical, social, family histories.  Patient is up-to-date on CRC screening and breast cancer screening.  No longer participates in cervical cancer screening due to age but is followed by GYN.  Patient was given information at discharge about preventative healthcare maintenance with anticipatory guidance.      Relevant Orders   CBC   Comprehensive metabolic panel   TSH   Paresthesia    Paresthesias in bilateral outside toes pending B12 level has been evaluated by podiatry      Relevant Orders   Vitamin B12   Obesity (BMI 30-39.9)    Pending TSH lipid panel.      Former tobacco use    Former tobacco abuse.  Pending urine microscopy to rule out microscopic hematuria       Relevant Orders   Urine Microscopic   Other Visit Diagnoses     Screening for lung cancer       Relevant Orders   Ambulatory Referral Lung Cancer Screening [facility]       Return in about 1 year (around 10/21/2023) for CPE and Labs.  Audria Nine, NP

## 2022-10-21 NOTE — Assessment & Plan Note (Signed)
Pending TSH lipid panel.

## 2022-10-21 NOTE — Assessment & Plan Note (Signed)
Former tobacco abuse.  Pending urine microscopy to rule out microscopic hematuria

## 2022-10-21 NOTE — Assessment & Plan Note (Signed)
Patient currently maintained on levothyroxine 88 mcg daily.  Tolerates medication well.  Pending TSH continue levothyroxine as prescribed

## 2022-10-21 NOTE — Patient Instructions (Signed)
Nice to see you today I will be in touch with the labs once I have reviewed them Follow up with me in 1 year, sooner if you need me  

## 2022-10-21 NOTE — Assessment & Plan Note (Signed)
Patient currently maintained on atorvastatin 10 mg tolerating medication well pending lipid panel today continue medication as prescribed

## 2022-10-21 NOTE — Assessment & Plan Note (Signed)
Paresthesias in bilateral outside toes pending B12 level has been evaluated by podiatry

## 2022-10-21 NOTE — Assessment & Plan Note (Signed)
Discussed age-appropriate immunizations and screening exams.  Patient is up-to-date on all age-appropriate vaccinations that she would like.  Did review patient's personal, surgical, social, family histories.  Patient is up-to-date on CRC screening and breast cancer screening.  No longer participates in cervical cancer screening due to age but is followed by GYN.  Patient was given information at discharge about preventative healthcare maintenance with anticipatory guidance.

## 2022-10-25 ENCOUNTER — Ambulatory Visit (INDEPENDENT_AMBULATORY_CARE_PROVIDER_SITE_OTHER): Payer: 59

## 2022-10-25 DIAGNOSIS — R7309 Other abnormal glucose: Secondary | ICD-10-CM | POA: Diagnosis not present

## 2022-10-25 LAB — HEMOGLOBIN A1C: Hgb A1c MFr Bld: 6.2 % (ref 4.6–6.5)

## 2022-10-25 NOTE — Addendum Note (Signed)
Addended by: Alvina Chou on: 10/25/2022 08:47 AM   Modules accepted: Orders

## 2022-11-02 ENCOUNTER — Ambulatory Visit
Admission: RE | Admit: 2022-11-02 | Discharge: 2022-11-02 | Disposition: A | Discharge status: Home / Self Care | Payer: 59 | Source: Ambulatory Visit | Attending: Obstetrics and Gynecology | Admitting: Obstetrics and Gynecology

## 2022-11-02 DIAGNOSIS — R928 Other abnormal and inconclusive findings on diagnostic imaging of breast: Secondary | ICD-10-CM | POA: Diagnosis not present

## 2022-11-02 DIAGNOSIS — N6489 Other specified disorders of breast: Secondary | ICD-10-CM

## 2023-01-20 ENCOUNTER — Ambulatory Visit: Payer: 59

## 2023-01-20 VITALS — Ht 66.0 in | Wt 195.0 lb

## 2023-01-20 DIAGNOSIS — Z0000 Encounter for general adult medical examination without abnormal findings: Secondary | ICD-10-CM

## 2023-01-20 NOTE — Patient Instructions (Signed)
Bianca Collins , Thank you for taking time to come for your Medicare Wellness Visit. I appreciate your ongoing commitment to your health goals. Please review the following plan we discussed and let me know if I can assist you in the future.   Referrals/Orders/Follow-Ups/Clinician Recommendations: none  This is a list of the screening recommended for you and due dates:  Health Maintenance  Topic Date Due   Pneumonia Vaccine (1 of 2 - PCV) Never done   DTaP/Tdap/Td vaccine (1 - Tdap) Never done   Screening for Lung Cancer  06/16/2022   Medicare Annual Wellness Visit  01/20/2024   Mammogram  04/29/2024   Colon Cancer Screening  12/09/2024   DEXA scan (bone density measurement)  Completed   Hepatitis C Screening  Completed   Zoster (Shingles) Vaccine  Completed   HPV Vaccine  Aged Out   Flu Shot  Discontinued   COVID-19 Vaccine  Discontinued    Advanced directives: (Copy Requested) Please bring a copy of your health care power of attorney and living will to the office to be added to your chart at your convenience.  Next Medicare Annual Wellness Visit scheduled for next year: Yes 01/25/24 @ 2:20pm telephone

## 2023-01-20 NOTE — Progress Notes (Signed)
Subjective:   Bianca Collins is a 77 y.o. female who presents for Medicare Annual (Subsequent) preventive examination.  Visit Complete: Virtual I connected with  Rubith Crotwell on 01/20/23 by a audio enabled telemedicine application and verified that I am speaking with the correct person using two identifiers.  Patient Location: Home  Provider Location: Office/Clinic  I discussed the limitations of evaluation and management by telemedicine. The patient expressed understanding and agreed to proceed.  Vital Signs: Because this visit was a virtual/telehealth visit, some criteria may be missing or patient reported. Any vitals not documented were not able to be obtained and vitals that have been documented are patient reported.  Patient Medicare AWV questionnaire was completed by the patient on (not done); I have confirmed that all information answered by patient is correct and no changes since this date. Cardiac Risk Factors include: advanced age (>71men, >80 women);obesity (BMI >30kg/m2)    Objective:    Today's Vitals   01/20/23 1433  Weight: 195 lb (88.5 kg)  Height: 5\' 6"  (1.676 m)   Body mass index is 31.47 kg/m.     01/20/2023    2:52 PM 01/11/2022   10:30 AM 05/25/2021    9:51 AM 05/16/2020   10:11 AM 04/01/2020    7:21 AM 03/25/2020   11:23 AM 10/16/2019    8:23 AM  Advanced Directives  Does Patient Have a Medical Advance Directive? Yes Yes Yes Yes Yes Yes Yes  Type of Estate agent of [place];Living will Healthcare Power of [place];Living will Healthcare Power of [employer] of [place];Living will Living will  Healthcare Power of Attorney  Does patient want to make changes to medical advance directive?   No - Patient declined No - Patient declined No - Patient declined No - Patient declined No - Patient declined  Copy of Healthcare Power of Attorney in Chart? No - copy requested No - copy requested Yes - validated most recent copy scanned in  chart (See row information) Yes - validated most recent copy scanned in chart (See row information) No - copy requested No - copy requested Yes - validated most recent copy scanned in chart (See row information)    Current Medications (verified) Outpatient Encounter Medications as of 01/20/2023  Medication Sig   ASPIRIN 81 PO Take by mouth daily.   atorvastatin (LIPITOR) 10 MG tablet Take 1 tablet (10 mg total) by mouth daily.   B Complex-C (B-COMPLEX WITH VITAMIN C) tablet Take 1 tablet by mouth daily.   Cholecalciferol (VITAMIN D3) 2000 units TABS Take 1 tablet by mouth daily.   estradiol (ESTRACE) 1 MG tablet Take 1 mg by mouth daily.   levothyroxine (SYNTHROID) 88 MCG tablet TAKE 1 TABLET BY MOUTH ON EMPTY STOMACH 30 MINUTES BEFORE BREAKFAST FOR THYROID   Multiple Vitamin (MULTIVITAMIN) tablet Take 1 tablet by mouth daily.   No facility-administered encounter medications on file as of 01/20/2023.    Allergies (verified) Patient has no known allergies.   History: Past Medical History:  Diagnosis Date   Arthritis    Cataract 11/29/2016   rt eye   Dr Ernesto Rutherford   Hx of adenomatous polyp of colon 06/13/2014   Hyperlipidemia    Hypothyroidism    Periprosthetic fracture around internal prosthetic right hip joint (HCC) 07/05/2014   Thyroid disease    Tobacco abuse    Past Surgical History:  Procedure Laterality Date   CATARACT EXTRACTION Bilateral    CHONDROPLASTY Left 04/01/2020   Procedure: CHONDROPLASTY;  Surgeon: Nadara Mustard, MD;  Location: [facility];  Service: Orthopedics;  Laterality: Left;   COLONOSCOPY     KNEE ARTHROSCOPY WITH LATERAL MENISECTOMY Left 04/01/2020   Procedure: KNEE ARTHROSCOPY WITH LATERAL MENISECTOMY;  Surgeon: Nadara Mustard, MD;  Location: [facility];  Service: Orthopedics;  Laterality: Left;   KNEE ARTHROSCOPY WITH MEDIAL MENISECTOMY Left 04/01/2020   Procedure: LEFT KNEE ARTHROSCOPY  WITH MEDIAL AND LATERAL  MENISECTOMIES, CHONDROPLASTY AND DEBRIDEMENT;  Surgeon: Nadara Mustard, MD;  Location: [facility];  Service: Orthopedics;  Laterality: Left;   ORIF FEMUR FRACTURE Right 07/24/2014   Procedure: OPEN REDUCTION INTERNAL FIXATION (ORIF) DISTAL FEMUR FRACTURE;  Surgeon: Nadara Mustard, MD;  Location: [facility];  Service: Orthopedics;  Laterality: Right;   ORIF PERIPROSTHETIC FRACTURE Right 07/02/2014   Procedure: Open Reduction Internal Fixation Femur Fracture, Revision Femur Total Hip Arthroplasty;  Surgeon: Nadara Mustard, MD;  Location: [facility];  Service: Orthopedics;  Laterality: Right;   ROTATOR CUFF REPAIR Right 09/19/2013   TOTAL HIP ARTHROPLASTY Right 06/19/2014   Procedure: TOTAL HIP ARTHROPLASTY;  Surgeon: Nadara Mustard, MD;  Location: [facility];  Service: Orthopedics;  Laterality: Right;   VAGINAL HYSTERECTOMY  03/23/1999   complete   Family History  Problem Relation Age of Onset   Diabetes type II Mother    Hypertension Mother    Diabetes Mother    Colon cancer Neg Hx    Thyroid disease Neg Hx    Stomach cancer Neg Hx    Esophageal cancer Neg Hx    Rectal cancer Neg Hx    Social History   Socioeconomic History   Marital status: Widowed    Spouse name: Not on file   Number of children: 2   Years of education: Not on file   Highest education level: Not on file  Occupational History   Not on file  Tobacco Use   Smoking status: Former    Current packs/day: 0.00    Average packs/day: 1 pack/day for 30.0 years (30.0 ttl pk-yrs)    Types: Cigarettes    Start date: 11/19/1985    Quit date: 11/20/2015    Years since quitting: 7.1   Smokeless tobacco: Never   Tobacco comments:    Patient reports that she is gradually trying to quit  Vaping Use   Vaping status: Never Used  Substance and Sexual Activity   Alcohol use: Yes    Alcohol/week: 14.0 standard drinks of alcohol    Types: 14 Glasses of wine per week    Comment: 2 glasses of wine at night   Drug use: No   Sexual  activity: Not Currently    Birth control/protection: Surgical  Other Topics Concern   Not on file  Social History Narrative   09/09/21   From: the area   Living: with daughter and son-in-law   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, nature       Family: Arlys John ([place]) and [place] - 3 grandchildren      Enjoys: crochet, read, cross-stitch      Exercise: ymca - 3 times a week   Diet: better than what she used to       Safety   Seat belts: Yes    Guns: Yes  and secure   Safe in relationships: Yes          Social Determinants of Health   Financial Resource Strain: Low Risk  (01/20/2023)  Overall Financial Resource Strain (CARDIA)    Difficulty of Paying Living Expenses: Not hard at all  Food Insecurity: No Food Insecurity (01/20/2023)   Hunger Vital Sign    Worried About Running Out of Food in the Last Year: Never true    Ran Out of Food in the Last Year: Never true  Transportation Needs: No Transportation Needs (01/20/2023)   PRAPARE - Administrator, Civil Service (Medical): No    Lack of Transportation (Non-Medical): No  Physical Activity: Insufficiently Active (01/20/2023)   Exercise Vital Sign    Days of Exercise per Week: 3 days    Minutes of Exercise per Session: 40 min  Stress: No Stress Concern Present (01/20/2023)   [employer] of Occupational Health - Occupational Stress Questionnaire    Feeling of Stress : Not at all  Social Connections: Socially Isolated (01/20/2023)   Social Connection and Isolation Panel [NHANES]    Frequency of Communication with Friends and Family: More than three times a week    Frequency of Social Gatherings with Friends and Family: Twice a week    Attends Religious Services: Never    Database administrator or Organizations: No    Attends Banker Meetings: Never    Marital Status: Widowed    Tobacco Counseling Counseling given: Not Answered Tobacco comments: Patient reports that she is gradually  trying to quit   Clinical Intake:  Pre-visit preparation completed: No  Pain : No/denies pain     Nutritional Status: BMI > 30  Obese Nutritional Risks: None Diabetes: No  How often do you need to have someone help you when you read instructions, pamphlets, or other written materials from your doctor or pharmacy?: 1 - Never  Interpreter Needed?: No  Comments: lives with daughter and son in law Information entered by :: B.Jewell Ryans,LPN   Activities of Daily Living    01/20/2023    2:54 PM  In your present state of health, do you have any difficulty performing the following activities:  Hearing? 0  Vision? 0  Difficulty concentrating or making decisions? 0  Walking or climbing stairs? 0  Dressing or bathing? 0  Doing errands, shopping? 0  Preparing Food and eating ? N  Using the Toilet? N  In the past six months, have you accidently leaked urine? Y  Do you have problems with loss of bowel control? N  Managing your Medications? N  Managing your Finances? N  Housekeeping or managing your Housekeeping? N    Patient Care Team: Eden Emms, NP as PCP - General (Nurse Practitioner)  Indicate any recent Medical Services you may have received from other than [facility] providers in the past year (date may be approximate).     Assessment:   This is a routine wellness examination for Jearldine.  Hearing/Vision screen Hearing Screening - Comments:: Pt says her hearing is good Vision Screening - Comments:: Pt says her vision is good;need readers only Dr Dione Booze    Goals Addressed             This Visit's Progress    COMPLETED: Increase physical activity   On track    To start going to the gym 2-3 days a week, goal to go 4-5 days a week      Patient Stated   Not on track    01/11/2022, wants to lose weight       Depression Screen    01/20/2023    2:48 PM 01/11/2022  10:31 AM 09/09/2021   10:41 AM 05/25/2021   10:33 AM 10/16/2019    8:21 AM 10/12/2019   10:01 AM  05/24/2019    9:36 AM  PHQ 2/9 Scores  PHQ - 2 Score 0 0 0 0 0 0 0    Fall Risk    01/20/2023    2:38 PM 01/11/2022   10:30 AM 05/25/2021   10:33 AM 05/16/2020   10:11 AM 10/16/2019    8:23 AM  Fall Risk   Falls in the past year? 0 0 0 0 0  Number falls in past yr: 0 0 0 0 0  Injury with Fall? 0 0 0 0 0  Risk for fall due to : No Fall Risks Medication side effect No Fall Risks    Follow up Education provided;Falls prevention discussed Falls prevention discussed;Education provided;Falls evaluation completed Falls evaluation completed      MEDICARE RISK AT HOME: Medicare Risk at Home Any stairs in or around the home?: No If so, are there any without handrails?: No Home free of loose throw rugs in walkways, pet beds, electrical cords, etc?: Yes Adequate lighting in your home to reduce risk of falls?: Yes Life alert?: No Use of a cane, walker or w/c?: No Grab bars in the bathroom?: Yes Shower chair or bench in shower?: No Elevated toilet seat or a handicapped toilet?: No  TIMED UP AND GO:  Was the test performed?  No    Cognitive Function:    12/07/2016    9:58 AM 05/28/2015   10:27 AM 05/28/2015   10:03 AM  MMSE - Mini Mental State Exam  Not completed:  -- --  Orientation to time 5 4 4   Orientation to Place 5 5 5   Registration 3 3 3   Attention/ Calculation 5 5 5   Recall 3 3 3   Language- name 2 objects 2 2 2   Language- repeat 1 1 1   Language- follow 3 step command 3 3 3   Language- read & follow direction 1 1 1   Write a sentence 1 1 1   Copy design 0 1 1  Total score 29 29 29         01/20/2023    2:56 PM 01/11/2022   10:32 AM 10/16/2019    8:25 AM 10/11/2018    3:37 PM  6CIT Screen  What Year? 0 points 0 points 0 points 0 points  What month? 0 points 0 points 0 points 0 points  What time? 0 points 0 points 0 points 0 points  Count back from 20 0 points 0 points 0 points 0 points  Months in reverse 0 points 0 points 0 points 0 points  Repeat phrase 0 points 0 points  0 points 0 points  Total Score 0 points 0 points 0 points 0 points    Immunizations Immunization History  Administered Date(s) Administered   PFIZER(Purple Top)SARS-COV-2 Vaccination 07/10/2019, 07/31/2019   Zoster Recombinant(Shingrix) 01/11/2020, 09/05/2020    TDAP status: Up to date  Flu Vaccine status: Declined, Education has been provided regarding the importance of this vaccine but patient still declined. Advised may receive this vaccine at local pharmacy or Health Dept. Aware to provide a copy of the vaccination record if obtained from local pharmacy or Health Dept. Verbalized acceptance and understanding.  Pneumococcal vaccine status: Declined,  Education has been provided regarding the importance of this vaccine but patient still declined. Advised may receive this vaccine at local pharmacy or Health Dept. Aware to provide a copy of the  vaccination record if obtained from local pharmacy or Health Dept. Verbalized acceptance and understanding.   Covid-19 vaccine status: Declined, Education has been provided regarding the importance of this vaccine but patient still declined. Advised may receive this vaccine at local pharmacy or Health Dept.or vaccine clinic. Aware to provide a copy of the vaccination record if obtained from local pharmacy or Health Dept. Verbalized acceptance and understanding.  Qualifies for Shingles Vaccine? Yes   Zostavax completed Yes   Shingrix Completed?: Yes  Screening Tests Health Maintenance  Topic Date Due   Pneumonia Vaccine 38+ Years old (1 of 2 - PCV) Never done   DTaP/Tdap/Td (1 - Tdap) Never done   Lung Cancer Screening  06/16/2022   Medicare Annual Wellness (AWV)  01/20/2024   MAMMOGRAM  04/29/2024   Colonoscopy  12/09/2024   DEXA SCAN  Completed   Hepatitis C Screening  Completed   Zoster Vaccines- Shingrix  Completed   HPV VACCINES  Aged Out   INFLUENZA VACCINE  Discontinued   COVID-19 Vaccine  Discontinued    Health  Maintenance  Health Maintenance Due  Topic Date Due   Pneumonia Vaccine 74+ Years old (1 of 2 - PCV) Never done   DTaP/Tdap/Td (1 - Tdap) Never done   Lung Cancer Screening  06/16/2022    Colorectal cancer screening: Type of screening: Colonoscopy. Completed 12/09/21. Repeat every 5-10 years  Mammogram status: Completed 04/29/22. Repeat every year  Bone Density status: Completed 08/30/2016. Results reflect: Bone density results: NORMAL. Repeat every 5 years.  Lung Cancer Screening: (Low Dose CT Chest recommended if Age 42-80 years, 20 pack-year currently smoking OR have quit w/in 15years.) does not qualify.   Lung Cancer Screening Referral: no  Additional Screening:  Hepatitis C Screening: does not qualify; Completed 03/23/2018  Vision Screening: Recommended annual ophthalmology exams for early detection of glaucoma and other disorders of the eye. Is the patient up to date with their annual eye exam?  Yes  Who is the provider or what is the name of the office in which the patient attends annual eye exams? Dr Dione Booze If pt is not established with a provider, would they like to be referred to a provider to establish care? No .   Dental Screening: Recommended annual dental exams for proper oral hygiene  Diabetic Foot Exam: n/a  Community Resource Referral / Chronic Care Management: CRR required this visit?  No   CCM required this visit?  No    Plan:     I have personally reviewed and noted the following in the patient's chart:   Medical and social history Use of alcohol, tobacco or illicit drugs  Current medications and supplements including opioid prescriptions. Patient is not currently taking opioid prescriptions. Functional ability and status Nutritional status Physical activity Advanced directives List of other physicians Hospitalizations, surgeries, and ER visits in previous 12 months Vitals Screenings to include cognitive, depression, and falls Referrals and  appointments  In addition, I have reviewed and discussed with patient certain preventive protocols, quality metrics, and best practice recommendations. A written personalized care plan for preventive services as well as general preventive health recommendations were provided to patient.    Sue Lush, LPN   [id number]   After Visit Summary: (MyChart) Due to this being a telephonic visit, the after visit summary with patients personalized plan was offered to patient via MyChart   Nurse Notes: The patient states she is doing well and has no concerns or questions at this time.

## 2023-04-13 ENCOUNTER — Other Ambulatory Visit: Payer: Self-pay | Admitting: Nurse Practitioner

## 2023-04-13 DIAGNOSIS — E034 Atrophy of thyroid (acquired): Secondary | ICD-10-CM

## 2023-04-14 NOTE — Telephone Encounter (Signed)
 Can we get patient scheduled per my last office note please

## 2023-04-14 NOTE — Telephone Encounter (Signed)
Patient has been scheduled

## 2023-05-27 ENCOUNTER — Telehealth: Payer: Self-pay | Admitting: Nurse Practitioner

## 2023-05-27 ENCOUNTER — Ambulatory Visit: Admitting: Nurse Practitioner

## 2023-05-27 VITALS — BP 122/70 | HR 66 | Temp 98.2°F | Ht 66.0 in | Wt 197.0 lb

## 2023-05-27 DIAGNOSIS — M546 Pain in thoracic spine: Secondary | ICD-10-CM | POA: Insufficient documentation

## 2023-05-27 MED ORDER — METHOCARBAMOL 500 MG PO TABS: 500.0000 mg | ORAL_TABLET | Freq: Two times a day (BID) | ORAL | Status: DC | PRN

## 2023-05-27 NOTE — Patient Instructions (Addendum)
 It was a pleasure seeing you today. Prescriptions have been sent to your pharmacy.  Continue what you have been doing with the heating pad, lidocaine patches, and acetaminophen. In addition you can get a topical medication called Voltaren gel to apply to the painful area. You can also do warm epsom salt bath soaks.  Follow up if your symptoms don't resolve.  You are due for your annual physical in 5 months.

## 2023-05-27 NOTE — Telephone Encounter (Signed)
 Patient requesting follow up on Robaxin prescription, does not appear to have been sent.   Copied from CRM [id number]. Topic: Clinical - Prescription Issue >> May 27, 2023  3:37 PM Denese Killings wrote: Reason for CRM: Patient stated that Pharmacy hasn't recieved medication for methocarbamol (ROBAXIN) tablet 500 mg yet.

## 2023-05-27 NOTE — Assessment & Plan Note (Addendum)
 Likely source of pain is from muscle spasm based on reproducibility during exam. Other differentials include but not limited to, cardiovascular etiologies, PE, and pneumonia. Continue heating pads, lidocaine patches, and acetaminophen. In addition start Voltaren gel OTC to apply to the painful area. Start methocarbamol 500mg  BID PRN for muscle spasms.  Sedation precautions reviewed  I evaluated patient, was consulted regarding treatment, and agree with assessment and plan per Denice Bors RN, FNP Student   Audria Nine, DNP, AGNP-C

## 2023-05-27 NOTE — Progress Notes (Signed)
 Acute Office Visit  Subjective:     Patient ID: Bianca Collins, female    DOB: December 30, 1945, 78 y.o.   MRN: [id number]  Chief Complaint  Patient presents with   Back Pain    Pt complains of back pain located in upper middle back that started 3-4 weeks ago. States of having trouble sleeping at night due to achy pain.     HPI Patient is in today for back pain with a history of aortic atherosclerosis, emphysema, hypothyroidism, former tobacco use, HRT, paresthesias. Negative family history of cardiac conditions  Symtpoms started 3-4 weeks ago and is described as a throbbing steady pain. She has tried over the counter treatment such as lidocaine, tyelnol and heating pad with improvement When she flexes her neck and reaches across her body with her right arm makes it pull. States that on occasion it would radiate around the left trunk  Review of Systems  Constitutional:  Negative for chills and fever.  Respiratory:  Negative for shortness of breath.   Cardiovascular:  Negative for chest pain.  Musculoskeletal:  Positive for back pain and myalgias.  Neurological:  Negative for headaches.        Objective:    BP 122/70   Pulse 66   Temp 98.2 F (36.8 C) (Oral)   Ht 5\' 6"  (1.676 m)   Wt 197 lb (89.4 kg)   SpO2 97%   BMI 31.80 kg/m  BP Readings from Last 3 Encounters:  05/27/23 122/70  10/21/22 132/76  04/20/22 132/78   Wt Readings from Last 3 Encounters:  05/27/23 197 lb (89.4 kg)  01/20/23 195 lb (88.5 kg)  10/21/22 197 lb (89.4 kg)   SpO2 Readings from Last 3 Encounters:  05/27/23 97%  10/21/22 94%  04/20/22 98%      Physical Exam Vitals and nursing note reviewed.  Constitutional:      Appearance: Normal appearance.  Cardiovascular:     Rate and Rhythm: Normal rate and regular rhythm.     Heart sounds: Normal heart sounds.  Pulmonary:     Effort: Pulmonary effort is normal.     Breath sounds: Normal breath sounds.  Musculoskeletal:        General:  Tenderness present.       Arms:     Thoracic back: Tenderness present. No spasms or bony tenderness. No scoliosis.     Comments: Neck flexion makes it worse  Lateral rotation. Lateral slides Lumbar flexion makes it worse   Neurological:     Mental Status: She is alert.     Deep Tendon Reflexes:     Reflex Scores:      Bicep reflexes are 2+ on the right side and 2+ on the left side.    Comments: Bilateral upper extremity strength 5/5     No results found for any visits on 05/27/23.      Assessment & Plan:   Problem List Items Addressed This Visit       Other   Acute left-sided thoracic back pain - Primary   Likely source of pain is from muscle spasm based on reproducibility during exam. Other differentials include but not limited to, cardiovascular etiologies, PE, and pneumonia. Continue heating pads, lidocaine patches, and acetaminophen. In addition start Voltaren gel OTC to apply to the painful area. Start methocarbamol 500mg  BID PRN for muscle spasms.  Sedation precautions reviewed  I evaluated patient, was consulted regarding treatment, and agree with assessment and plan per Denice Bors RN, FNP Student  Audria Nine, DNP, AGNP-C       Relevant Medications   methocarbamol (ROBAXIN) tablet 500 mg    Meds ordered this encounter  Medications   methocarbamol (ROBAXIN) tablet 500 mg    Return in about 5 months (around 10/27/2023), or if symptoms worsen or fail to improve, for CPE.  Audria Nine, NP

## 2023-05-27 NOTE — Progress Notes (Signed)
 Acute Office Visit  Subjective:     Patient ID: Bianca Collins, female    DOB: [birth year], 78 y.o.   MRN: [id number]  Chief Complaint  Patient presents with   Back Pain    Pt complains of back pain located in upper middle back that started 3-4 weeks ago. States of having trouble sleeping at night due to achy pain.      Patient is in today for pain to her upper back that started 3-4 weeks ago. She has a hx of hypothyroidism, emphysema, aortic atherosclerosis, and hormone replacement therapy. It was noticed after she woke up one morning and described as throbbing and steady. The pain is located to the L paraspinal area and rarely will radiate around to the L lateral side. She denies any injuries, heavy lifting, or new activity. She denies CP, ShOB,or N/V. She has not had any long periods of sitting or recent air travel. She reports a pulling sensation when she flexes her neck or reaches across with her R arm. Sitting makes the pain worse. Heating pads, lidocaine patches, and acetaminophen improves the pain.     Review of Systems  Constitutional:  Negative for chills, fever and weight loss.  Respiratory:  Negative for shortness of breath.   Cardiovascular:  Negative for chest pain and palpitations.  Gastrointestinal:  Negative for abdominal pain, nausea and vomiting.  Musculoskeletal:  Positive for back pain. Negative for myalgias.        Objective:    BP 122/70   Pulse 66   Temp 98.2 F (36.8 C) (Oral)   Ht 5\' 6"  (1.676 m)   Wt 89.4 kg   SpO2 97%   BMI 31.80 kg/m    Physical Exam Constitutional:      Appearance: Normal appearance.  Cardiovascular:     Rate and Rhythm: Normal rate and regular rhythm.     Pulses: Normal pulses.     Heart sounds: Normal heart sounds. No murmur heard.    No friction rub. No gallop.  Pulmonary:     Effort: Pulmonary effort is normal.     Breath sounds: Normal breath sounds.  Musculoskeletal:     Thoracic back: Tenderness present. No  spasms.       Back:  Neurological:     Mental Status: She is alert.     Deep Tendon Reflexes:     Reflex Scores:      Bicep reflexes are 2+ on the right side and 2+ on the left side.      Patellar reflexes are 2+ on the right side and 2+ on the left side.    Comments: Bilateral upper and lower extremity strength is 5/5     No results found for any visits on 05/27/23.      Assessment & Plan:   Problem List Items Addressed This Visit     Acute left-sided thoracic back pain - Primary   Likely source of pain is from muscle spasm based on reproducibility during exam. Other differentials include but not limited to, cardiovascular etiologies, PE, and pneumonia. Continue heating pads, lidocaine patches, and acetaminophen. In addition start Voltaren gel OTC to apply to the painful area. Start methocarbamol 500mg  BID PRN for muscle spasms.       Relevant Medications   methocarbamol (ROBAXIN) tablet 500 mg    Meds ordered this encounter  Medications   methocarbamol (ROBAXIN) tablet 500 mg    Return in about 5 months (around 10/27/2023), or if symptoms worsen or  fail to improve, for CPE.  Murvin Donning, RN

## 2023-05-28 MED ORDER — METHOCARBAMOL 500 MG PO TABS: 500.0000 mg | ORAL_TABLET | Freq: Two times a day (BID) | ORAL | 0 refills | Status: DC | PRN

## 2023-05-28 NOTE — Addendum Note (Signed)
 Addended by: Eden Emms on: 05/28/2023 08:10 AM   Modules accepted: Orders

## 2023-06-16 DIAGNOSIS — Z1231 Encounter for screening mammogram for malignant neoplasm of breast: Secondary | ICD-10-CM | POA: Diagnosis not present

## 2023-07-11 ENCOUNTER — Ambulatory Visit (INDEPENDENT_AMBULATORY_CARE_PROVIDER_SITE_OTHER): Admitting: Family

## 2023-07-11 ENCOUNTER — Encounter: Payer: Self-pay | Admitting: Family

## 2023-07-11 VITALS — BP 138/74 | HR 79 | Temp 98.0°F | Ht 66.0 in

## 2023-07-11 DIAGNOSIS — H6123 Impacted cerumen, bilateral: Secondary | ICD-10-CM | POA: Diagnosis not present

## 2023-07-11 NOTE — Assessment & Plan Note (Signed)
Ceruminosis is noted.  Obtained verbal patient consent prior to procedure, possible risks of procedure discussed with pt prior, and then Wax was removed by syringing/irrigation and manual debridement was performed by me with curette. Instructions for home care to prevent wax buildup are given and handout provided to pt .pt tolerated procedure well.   

## 2023-07-11 NOTE — Progress Notes (Signed)
 Established Patient Office Visit  Subjective:   Patient ID: Bianca Collins, female    DOB: [birth year]  Age: 78 y.o. MRN: [id number]  CC:  Chief Complaint  Patient presents with   Acute Visit    Ear issues    HPI: Bianca Collins is a 78 y.o. female presenting on 07/11/2023 for Acute Visit (Ear issues)  Bil ears feel stopped up. She's been cleaning them and pullingon them and for a moment it will clear but then goes back to feeling full again. She feels like 'I'm in a barrel'. No ear pain. No drainage in her ears. Runny nose and watery eyes from allergies. She only takes allergy medications as needed.                ROS: Negative unless specifically indicated above in HPI.   Relevant past medical history reviewed and updated as indicated.   Allergies and medications reviewed and updated.   Current Outpatient Medications:    ASPIRIN  81 PO, Take by mouth daily., Disp: , Rfl:    atorvastatin  (LIPITOR) 10 MG tablet, Take 1 tablet (10 mg total) by mouth daily., Disp: 90 tablet, Rfl: 3   B Complex-C (B-COMPLEX WITH VITAMIN C) tablet, Take 1 tablet by mouth daily., Disp: , Rfl:    Cholecalciferol (VITAMIN D3) 2000 units TABS, Take 1 tablet by mouth daily., Disp: , Rfl:    estradiol  (ESTRACE ) 0.5 MG tablet, Take 0.5 mg by mouth daily., Disp: , Rfl:    levothyroxine  (SYNTHROID ) 88 MCG tablet, TAKE 1 TABLET BY MOUTH ON EMPTY STOMACH 30 MINUTES BEFORE BREAKFAST FOR THYROID , Disp: 90 tablet, Rfl: 1   Multiple Vitamin (MULTIVITAMIN) tablet, Take 1 tablet by mouth daily., Disp: , Rfl:   No Known Allergies  Objective:   BP 138/74 (BP Location: Left Arm, Patient Position: Sitting, Cuff Size: Normal)   Pulse 79   Temp 98 F (36.7 C) (Temporal)   Ht 5\' 6"  (1.676 m)   SpO2 98%   BMI 31.80 kg/m    Physical Exam Constitutional:      General: She is not in acute distress.    Appearance: Normal appearance. She is normal weight. She is not ill-appearing, toxic-appearing or diaphoretic.   HENT:     Head: Normocephalic.     Right Ear: Tympanic membrane normal. There is impacted cerumen.     Left Ear: Tympanic membrane normal. There is impacted cerumen.     Nose: Nose normal.     Mouth/Throat:     Mouth: Mucous membranes are dry.     Pharynx: No oropharyngeal exudate or posterior oropharyngeal erythema.  Eyes:     Extraocular Movements: Extraocular movements intact.     Pupils: Pupils are equal, round, and reactive to light.  Cardiovascular:     Rate and Rhythm: Normal rate and regular rhythm.     Pulses: Normal pulses.     Heart sounds: Normal heart sounds.  Pulmonary:     Effort: Pulmonary effort is normal.     Breath sounds: Normal breath sounds.  Musculoskeletal:        General: Normal range of motion.     Cervical back: Normal range of motion.  Neurological:     General: No focal deficit present.     Mental Status: She is alert and oriented to person, place, and time. Mental status is at baseline.  Psychiatric:        Mood and Affect: Mood normal.        Behavior:  Behavior normal.        Thought Content: Thought content normal.        Judgment: Judgment normal.     Assessment & Plan:  Bilateral impacted cerumen Assessment & Plan: Ceruminosis is noted.  Obtained verbal patient consent prior to procedure, possible risks of procedure discussed with pt prior, and then Wax was removed by syringing/irrigation and manual debridement was performed by me with curette. Instructions for home care to prevent wax buildup are given and handout provided to pt .pt tolerated procedure well.       Follow up plan: Return for f/u PCP if no improvement in symptoms.  Felicita Horns, FNP

## 2023-08-03 ENCOUNTER — Encounter: Payer: Self-pay | Admitting: Family

## 2023-08-03 ENCOUNTER — Ambulatory Visit: Admitting: Family

## 2023-08-03 DIAGNOSIS — M1712 Unilateral primary osteoarthritis, left knee: Secondary | ICD-10-CM | POA: Diagnosis not present

## 2023-08-03 MED ORDER — METHYLPREDNISOLONE ACETATE 40 MG/ML IJ SUSP
40.0000 mg | INTRAMUSCULAR | Status: AC | PRN
  Administered 2023-08-03: 40 mg via INTRA_ARTICULAR

## 2023-08-03 MED ORDER — LIDOCAINE HCL 1 % IJ SOLN
5.0000 mL | INTRAMUSCULAR | Status: AC | PRN
  Administered 2023-08-03: 5 mL

## 2023-08-03 NOTE — Progress Notes (Signed)
 Office Visit Note   Patient: Bianca Collins           Date of Birth: 07-20-1945           MRN: [id number] Visit Date: 08/03/2023              Requested by: Dorothe Gaster, NP [address] [place],  [place] [address] PCP: Dorothe Gaster, NP  Chief Complaint  Patient presents with   Left Knee - Follow-up       HPI: The patient is a 78 year old woman who presents today in follow-up for arthritic pain to the left knee.  She last had a Depo-Medrol  injection april of last year. has been doing quite well since then.    She however has had return of her symptoms of the left knee over the last month.  Requests repeat injection.  she complains of start up stiffness and deep knee pain no falls no giving way  Assessment & Plan: Visit Diagnoses: No diagnosis found.  Plan: Depo-Medrol  injection left knee today.  Patient tolerated well.  She will follow-up as needed.  Follow-Up Instructions: No follow-ups on file.   Left Knee Exam   Muscle Strength  The patient has normal left knee strength.  Tenderness  The patient is experiencing tenderness in the medial joint line.  Range of Motion  The patient has normal left knee ROM.  Other  Effusion: no effusion present      Patient is alert, oriented, no adenopathy, well-dressed, normal affect, normal respiratory effort.   Imaging: No results found. No images are attached to the encounter.  Labs: Lab Results  Component Value Date   HGBA1C 6.2 10/25/2022     Lab Results  Component Value Date   ALBUMIN 4.1 10/21/2022   ALBUMIN 4.1 04/20/2022   ALBUMIN 4.0 05/28/2015    No results found for: "MG" No results found for: "VD25OH"  No results found for: "PREALBUMIN"    Latest Ref Rng & Units 10/21/2022   10:47 AM 04/20/2022   11:06 AM 05/25/2021   11:30 AM  CBC EXTENDED  WBC 4.0 - 10.5 K/uL 8.5  9.9  9.1   RBC 3.87 - 5.11 Mil/uL 4.32  4.25  4.20   Hemoglobin 12.0 - 15.0 g/dL 46.9  62.9  52.8   HCT 36.0 - 46.0 % 41.1   40.6  40.1   Platelets 150.0 - 400.0 K/uL 264.0  270.0  256   NEUT# 1,500 - 7,800 cells/uL   5,678   Lymph# 850 - 3,900 cells/uL   2,348      There is no height or weight on file to calculate BMI.  Orders:  No orders of the defined types were placed in this encounter.  No orders of the defined types were placed in this encounter.    Procedures: Large Joint Inj: L knee on 08/03/2023 1:56 PM Indications: pain Details: 18 G 1.5 in needle, anteromedial approach Medications: 5 mL lidocaine  1 %; 40 mg methylPREDNISolone  acetate 40 MG/ML Consent was given by the patient.      Clinical Data: No additional findings.  ROS:  All other systems negative, except as noted in the HPI. Review of Systems  Objective: Vital Signs: There were no vitals taken for this visit.  Specialty Comments:  No specialty comments available.  PMFS History: Patient Active Problem List   Diagnosis Date Noted   Acute left-sided thoracic back pain 05/27/2023   Paresthesia 10/21/2022   Obesity (BMI 30-39.9) 10/21/2022  Former tobacco use 10/21/2022   Bilateral impacted cerumen 04/20/2022   Ganglion cyst of finger of left hand 02/09/2022   Emphysema lung (HCC) 09/09/2021   Hormone replacement therapy (HRT) 09/09/2021   Benign paroxysmal positional vertigo 09/09/2021   Aortic atherosclerosis (HCC) 01/12/2019   H/O total hip arthroplasty 06/19/2014   Hx of adenomatous polyp of colon 06/13/2014   Hypothyroidism 06/06/2014   Past Medical History:  Diagnosis Date   Arthritis    Cataract 11/29/2016   rt eye   Dr Maris Sickle   Hx of adenomatous polyp of colon 06/13/2014   Hyperlipidemia    Hypothyroidism    Periprosthetic fracture around internal prosthetic right hip joint (HCC) 07/05/2014   Thyroid  disease    Tobacco abuse     Family History  Problem Relation Age of Onset   Diabetes type II Mother    Hypertension Mother    Diabetes Mother    Colon cancer Neg Hx    Thyroid  disease Neg Hx     Stomach cancer Neg Hx    Esophageal cancer Neg Hx    Rectal cancer Neg Hx     Past Surgical History:  Procedure Laterality Date   CATARACT EXTRACTION Bilateral    CHONDROPLASTY Left 04/01/2020   Procedure: CHONDROPLASTY;  Surgeon: Timothy Ford, MD;  Location: [facility];  Service: Orthopedics;  Laterality: Left;   COLONOSCOPY     KNEE ARTHROSCOPY WITH LATERAL MENISECTOMY Left 04/01/2020   Procedure: KNEE ARTHROSCOPY WITH LATERAL MENISECTOMY;  Surgeon: Timothy Ford, MD;  Location: [facility];  Service: Orthopedics;  Laterality: Left;   KNEE ARTHROSCOPY WITH MEDIAL MENISECTOMY Left 04/01/2020   Procedure: LEFT KNEE ARTHROSCOPY  WITH MEDIAL AND LATERAL MENISECTOMIES, CHONDROPLASTY AND DEBRIDEMENT;  Surgeon: Timothy Ford, MD;  Location: [facility];  Service: Orthopedics;  Laterality: Left;   ORIF FEMUR FRACTURE Right 07/24/2014   Procedure: OPEN REDUCTION INTERNAL FIXATION (ORIF) DISTAL FEMUR FRACTURE;  Surgeon: Timothy Ford, MD;  Location: [facility];  Service: Orthopedics;  Laterality: Right;   ORIF PERIPROSTHETIC FRACTURE Right 07/02/2014   Procedure: Open Reduction Internal Fixation Femur Fracture, Revision Femur Total Hip Arthroplasty;  Surgeon: Timothy Ford, MD;  Location: [facility];  Service: Orthopedics;  Laterality: Right;   ROTATOR CUFF REPAIR Right 09/19/2013   TOTAL HIP ARTHROPLASTY Right 06/19/2014   Procedure: TOTAL HIP ARTHROPLASTY;  Surgeon: Timothy Ford, MD;  Location: [facility];  Service: Orthopedics;  Laterality: Right;   VAGINAL HYSTERECTOMY  03/23/1999   complete   Social History   Occupational History   Not on file  Tobacco Use   Smoking status: Former    Current packs/day: 0.00    Average packs/day: 1 pack/day for 30.0 years (30.0 ttl pk-yrs)    Types: Cigarettes    Start date: 11/19/1985    Quit date: 11/20/2015    Years since quitting: 7.7   Smokeless tobacco: Never   Tobacco comments:    Patient reports that she is  gradually trying to quit  Vaping Use   Vaping status: Never Used  Substance and Sexual Activity   Alcohol use: Yes    Alcohol/week: 14.0 standard drinks of alcohol    Types: 14 Glasses of wine per week    Comment: 2 glasses of wine at night   Drug use: No   Sexual activity: Not Currently    Birth control/protection: Surgical

## 2023-10-20 ENCOUNTER — Other Ambulatory Visit: Payer: Self-pay | Admitting: Nurse Practitioner

## 2023-10-20 DIAGNOSIS — I700 Atherosclerosis of aorta: Secondary | ICD-10-CM

## 2023-10-24 ENCOUNTER — Encounter: Payer: Self-pay | Admitting: Nurse Practitioner

## 2023-10-24 ENCOUNTER — Ambulatory Visit (INDEPENDENT_AMBULATORY_CARE_PROVIDER_SITE_OTHER): Payer: Medicare Other | Admitting: Nurse Practitioner

## 2023-10-24 VITALS — BP 102/68 | HR 70 | Temp 97.6°F | Ht 66.0 in | Wt 196.4 lb

## 2023-10-24 DIAGNOSIS — J439 Emphysema, unspecified: Secondary | ICD-10-CM

## 2023-10-24 DIAGNOSIS — Z122 Encounter for screening for malignant neoplasm of respiratory organs: Secondary | ICD-10-CM | POA: Diagnosis not present

## 2023-10-24 DIAGNOSIS — E034 Atrophy of thyroid (acquired): Secondary | ICD-10-CM

## 2023-10-24 DIAGNOSIS — R7303 Prediabetes: Secondary | ICD-10-CM | POA: Insufficient documentation

## 2023-10-24 DIAGNOSIS — Z0000 Encounter for general adult medical examination without abnormal findings: Secondary | ICD-10-CM

## 2023-10-24 DIAGNOSIS — Z87891 Personal history of nicotine dependence: Secondary | ICD-10-CM | POA: Diagnosis not present

## 2023-10-24 DIAGNOSIS — R202 Paresthesia of skin: Secondary | ICD-10-CM | POA: Diagnosis not present

## 2023-10-24 DIAGNOSIS — I700 Atherosclerosis of aorta: Secondary | ICD-10-CM | POA: Diagnosis not present

## 2023-10-24 DIAGNOSIS — E669 Obesity, unspecified: Secondary | ICD-10-CM

## 2023-10-24 LAB — URINALYSIS, MICROSCOPIC ONLY

## 2023-10-24 LAB — COMPREHENSIVE METABOLIC PANEL WITH GFR
ALT: 12 U/L (ref 0–35)
AST: 11 U/L (ref 0–37)
Albumin: 4.1 g/dL (ref 3.5–5.2)
Alkaline Phosphatase: 58 U/L (ref 39–117)
BUN: 13 mg/dL (ref 6–23)
CO2: 28 meq/L (ref 19–32)
Calcium: 9.2 mg/dL (ref 8.4–10.5)
Chloride: 103 meq/L (ref 96–112)
Creatinine, Ser: 0.68 mg/dL (ref 0.40–1.20)
GFR: 85.03 mL/min (ref >60.00)
Glucose, Bld: 145 mg/dL — ABNORMAL HIGH (ref 70–99)
Potassium: 4.7 meq/L (ref 3.5–5.1)
Sodium: 141 meq/L (ref 135–145)
Total Bilirubin: 0.4 mg/dL (ref 0.2–1.2)
Total Protein: 6.6 g/dL (ref 6.0–8.3)

## 2023-10-24 LAB — HEMOGLOBIN A1C: Hgb A1c MFr Bld: 6.6 % — ABNORMAL HIGH (ref 4.6–6.5)

## 2023-10-24 LAB — CBC WITH DIFFERENTIAL/PLATELET
Basophils Absolute: 0.1 K/uL (ref 0.0–0.1)
Basophils Relative: 0.7 % (ref 0.0–3.0)
Eosinophils Absolute: 0.1 K/uL (ref 0.0–0.7)
Eosinophils Relative: 1.1 % (ref 0.0–5.0)
HCT: 41.4 % (ref 36.0–46.0)
Hemoglobin: 13.7 g/dL (ref 12.0–15.0)
Lymphocytes Relative: 24.3 % (ref 12.0–46.0)
Lymphs Abs: 2 K/uL (ref 0.7–4.0)
MCHC: 33.1 g/dL (ref 30.0–36.0)
MCV: 95.2 fl (ref 78.0–100.0)
Monocytes Absolute: 0.6 K/uL (ref 0.1–1.0)
Monocytes Relative: 6.8 % (ref 3.0–12.0)
Neutro Abs: 5.6 K/uL (ref 1.4–7.7)
Neutrophils Relative %: 67.1 % (ref 43.0–77.0)
Platelets: 248 K/uL (ref 150.0–400.0)
RBC: 4.35 Mil/uL (ref 3.87–5.11)
RDW: 13 % (ref 11.5–15.5)
WBC: 8.4 K/uL (ref 4.0–10.5)

## 2023-10-24 LAB — LIPID PANEL
Cholesterol: 156 mg/dL (ref 0–200)
HDL: 63.7 mg/dL (ref >39.00)
LDL Cholesterol: 69 mg/dL (ref 0–99)
NonHDL: 92.08
Total CHOL/HDL Ratio: 2
Triglycerides: 116 mg/dL (ref 0.0–149.0)
VLDL: 23.2 mg/dL (ref 0.0–40.0)

## 2023-10-24 LAB — TSH: TSH: 1.09 u[IU]/mL (ref 0.35–5.50)

## 2023-10-24 MED ORDER — LEVOTHYROXINE SODIUM 88 MCG PO TABS: ORAL_TABLET | ORAL | 3 refills | Status: AC

## 2023-10-24 NOTE — Assessment & Plan Note (Signed)
 Pending urine microscopy rule out microscopic hematuria.  Patient was referred to LDCT program

## 2023-10-24 NOTE — Assessment & Plan Note (Signed)
 Currently maintained on levothyroxine  88 mcg daily.  Refill provided today.  Pending TSH

## 2023-10-24 NOTE — Assessment & Plan Note (Signed)
 Pending TSH, A1c, lipid panel.  Continue working healthy lifestyle modifications

## 2023-10-24 NOTE — Assessment & Plan Note (Signed)
 Discussed age-appropriate immunizations and screening exams.  Did review patient's personal, surgical, social, family histories.  Patient is up-to-date with age-appropriate vaccinations she would like.  Update tetanus vaccine at local pharmacy.  Patient declined Prevnar 20 today.  Patient up-to-date on CRC screening.  She is followed by GYN in regards to DEXA, breast cancer screening.  Patient was given information at discharge about preventative healthcare maintenance with anticipatory guidance.

## 2023-10-24 NOTE — Progress Notes (Signed)
 Established Patient Office Visit  Subjective   Patient ID: Bianca Collins, female    DOB: 1946-01-11  Age: 78 y.o. MRN: [id number]  Chief Complaint  Patient presents with   Annual Exam    HPI  Hypothyroidism: Patient currently maintained on levothyroxine  88 mcg daily  HRT: Currently maintained on estradiol  0.5 mg daily. Followed by Dr. Truman Corona  HLD: Currently maintained on atorvastatin  10 mg daily  for complete physical and follow up of chronic conditions.  Immunizations: -Tetanus:  update at pharmacy -Influenza: out of season  -Shingles: Completed Shingrix series -Pneumonia: refused  Diet: Fair diet. She is eating 3 small meals. Sometimes she will do 2 meals. She does snack on occasion. Exercise: No regular exercise. Yoga states that she has been doing it for the past week. She is trying daily but getting 2-3 days a week   Eye exam: Completes annually. Dr Octavia. Reading glasses  Dental exam: Completes semi-annually.   Colonoscopy: Completed in 12/09/2021, repeat 3 years.  Patient due 2026 Lung Cancer Screening: amb refer ldct program   Pap smear: Aged out, Dr. corona  Mammogram: 11/02/2022. Due this month. Followed by GYN  DEXA scan: with Dr Corona   Sleep: going to bed around 12-1 and then will get up around  815. she will get up several times a night   Advance directive: she does have a living will. Did it with senior care        Review of Systems  Constitutional:  Negative for chills and fever.  Respiratory:  Negative for shortness of breath.   Cardiovascular:  Negative for chest pain and leg swelling.  Gastrointestinal:  Negative for abdominal pain, blood in stool, constipation, diarrhea, nausea and vomiting.       BM daily   Genitourinary:  Negative for dysuria and hematuria.  Neurological:  Positive for tingling (bilateral big toes). Negative for dizziness and headaches.  Psychiatric/Behavioral:  Negative for hallucinations and suicidal ideas.        Objective:     BP 102/68   Pulse 70   Temp 97.6 F (36.4 C) (Oral)   Ht 5' 6 (1.676 m)   Wt 196 lb 6.4 oz (89.1 kg)   SpO2 98%   BMI 31.70 kg/m  BP Readings from Last 3 Encounters:  10/24/23 102/68  07/11/23 138/74  05/27/23 122/70   Wt Readings from Last 3 Encounters:  10/24/23 196 lb 6.4 oz (89.1 kg)  05/27/23 197 lb (89.4 kg)  01/20/23 195 lb (88.5 kg)   SpO2 Readings from Last 3 Encounters:  10/24/23 98%  07/11/23 98%  05/27/23 97%      Physical Exam Vitals and nursing note reviewed.  Constitutional:      Appearance: Normal appearance.  HENT:     Right Ear: Tympanic membrane, ear canal and external ear normal.     Left Ear: Tympanic membrane, ear canal and external ear normal.     Mouth/Throat:     Mouth: Mucous membranes are moist.     Pharynx: Oropharynx is clear.  Eyes:     Extraocular Movements: Extraocular movements intact.     Pupils: Pupils are equal, round, and reactive to light.  Cardiovascular:     Rate and Rhythm: Normal rate and regular rhythm.     Pulses: Normal pulses.     Heart sounds: Normal heart sounds.  Pulmonary:     Effort: Pulmonary effort is normal.     Breath sounds: Normal breath sounds.  Abdominal:  General: Bowel sounds are normal. There is no distension.     Palpations: There is no mass.     Tenderness: There is no abdominal tenderness.     Hernia: No hernia is present.  Musculoskeletal:     Right lower leg: No edema.     Left lower leg: No edema.  Lymphadenopathy:     Cervical: No cervical adenopathy.  Skin:    General: Skin is warm.  Neurological:     General: No focal deficit present.     Mental Status: She is alert.     Deep Tendon Reflexes:     Reflex Scores:      Bicep reflexes are 2+ on the right side and 2+ on the left side.      Patellar reflexes are 2+ on the right side and 2+ on the left side.    Comments: Bilateral upper and lower extremity strength 5/5  Psychiatric:        Mood and Affect:  Mood normal.        Behavior: Behavior normal.        Thought Content: Thought content normal.        Judgment: Judgment normal.      No results found for any visits on 10/24/23.    The 10-year ASCVD risk score (Arnett DK, et al., 2019) is: 10.4%    Assessment & Plan:   Problem List Items Addressed This Visit       Cardiovascular and Mediastinum   Aortic atherosclerosis (HCC)   Incidental finding on low-dose CT scan.  Patient currently maintained on atorvastatin  10 mg daily.  Pending lipid panel today      Relevant Orders   Lipid panel     Respiratory   Emphysema lung (HCC)   Incidental finding on low-dose CT scan.  Stable.        Endocrine   Hypothyroidism   Currently maintained on levothyroxine  88 mcg daily.  Refill provided today.  Pending TSH      Relevant Medications   levothyroxine  (SYNTHROID ) 88 MCG tablet   Other Relevant Orders   TSH     Other   Preventative health care - Primary   Discussed age-appropriate immunizations and screening exams.  Did review patient's personal, surgical, social, family histories.  Patient is up-to-date with age-appropriate vaccinations she would like.  Update tetanus vaccine at local pharmacy.  Patient declined Prevnar 20 today.  Patient up-to-date on CRC screening.  She is followed by GYN in regards to DEXA, breast cancer screening.  Patient was given information at discharge about preventative healthcare maintenance with anticipatory guidance.      Relevant Orders   CBC with Differential/Platelet   Comprehensive metabolic panel with GFR   TSH   Paresthesia   Long-term and stable.  Likely secondary to elevated glucose levels      Obesity (BMI 30-39.9)   Pending TSH, A1c, lipid panel.  Continue working healthy lifestyle modifications.      Former tobacco use   Pending urine microscopy rule out microscopic hematuria.  Patient was referred to LDCT program      Relevant Orders   Urine Microscopic   Prediabetes    History of the same.  Patient is working on trying to lose weight she is doing yoga as of late.  Continue working hydroxyl modifications pending A1c today      Relevant Orders   Hemoglobin A1c   Other Visit Diagnoses       Screening for lung  cancer       Relevant Orders   Ambulatory Referral Lung Cancer Screening [facility]       Return in about 1 year (around 10/23/2024) for CPE and Labs.    Adina Crandall, NP

## 2023-10-24 NOTE — Patient Instructions (Signed)
 Nice to see you today I will be in touch with the labs once I have them Follow up with me in 1 year, sooner if you need me

## 2023-10-24 NOTE — Assessment & Plan Note (Signed)
 Incidental finding on low-dose CT scan.  Stable.

## 2023-10-24 NOTE — Assessment & Plan Note (Signed)
 Long-term and stable.  Likely secondary to elevated glucose levels

## 2023-10-24 NOTE — Assessment & Plan Note (Signed)
 History of the same.  Patient is working on trying to lose weight she is doing yoga as of late.  Continue working hydroxyl modifications pending A1c today

## 2023-10-24 NOTE — Assessment & Plan Note (Signed)
 Incidental finding on low-dose CT scan.  Patient currently maintained on atorvastatin  10 mg daily.  Pending lipid panel today

## 2023-10-27 ENCOUNTER — Ambulatory Visit: Payer: Self-pay | Admitting: Nurse Practitioner

## 2023-11-03 NOTE — Telephone Encounter (Signed)
 Copied from CRM #[id number]. Topic: Clinical - Lab/Test Results >> Nov 03, 2023  9:51 AM [place] J wrote: Reason for CRM: Patient called for labs. I successfully relayed and she has no further questions at this time. The patient will call back at a later time to schedule her follow-up.

## 2023-12-21 ENCOUNTER — Other Ambulatory Visit (INDEPENDENT_AMBULATORY_CARE_PROVIDER_SITE_OTHER): Payer: Self-pay

## 2023-12-21 ENCOUNTER — Ambulatory Visit (INDEPENDENT_AMBULATORY_CARE_PROVIDER_SITE_OTHER): Admitting: Family

## 2023-12-21 DIAGNOSIS — M5416 Radiculopathy, lumbar region: Secondary | ICD-10-CM | POA: Diagnosis not present

## 2023-12-21 DIAGNOSIS — M25552 Pain in left hip: Secondary | ICD-10-CM

## 2023-12-21 MED ORDER — PREDNISONE 50 MG PO TABS: ORAL_TABLET | ORAL | 0 refills | Status: DC

## 2023-12-21 NOTE — Progress Notes (Signed)
 Office Visit Note   Patient: Bianca Collins           Date of Birth: Jul 12, 1945           MRN: [id number] Visit Date: 12/21/2023              Requested by: Wendee Lynwood HERO, NP [address] [place],  [place] [address] PCP: Wendee Lynwood HERO, NP  Chief Complaint  Patient presents with   Left Hip - Pain      HPI: The patient is a 78 year old woman who presents today complaining of left hip pain for the last 3 weeks.  She has not had any associated injury she is complaining of buttock pain as well and some associated shooting pain in her groin   she thinks this may feel similar to when she had osteoarthritic pain in her right side which ultimately led to total hip replacement on the right  Assessment & Plan: Visit Diagnoses:  1. Pain in left hip     Plan: Lumbar radiculopathy left-sided will place on a course of prednisone she will follow-up in the office if she fails to improve as expected Follow-Up Instructions: No follow-ups on file.   Left Hip Exam   Tenderness  The patient is experiencing no tenderness.   Range of Motion  The patient has normal left hip ROM.  Other  Pulse: present   Back Exam   Tenderness  The patient is experiencing no tenderness.   Muscle Strength  The patient has normal back strength.  Tests  Straight leg raise left: positive      Patient is alert, oriented, no adenopathy, well-dressed, normal affect, normal respiratory effort.     Imaging: No results found. No images are attached to the encounter.  Labs: Lab Results  Component Value Date   HGBA1C 6.6 (H) 10/24/2023   HGBA1C 6.2 10/25/2022     Lab Results  Component Value Date   ALBUMIN 4.1 10/24/2023   ALBUMIN 4.1 10/21/2022   ALBUMIN 4.1 04/20/2022    No results found for: MG No results found for: VD25OH  No results found for: PREALBUMIN    Latest Ref Rng & Units 10/24/2023   10:38 AM 10/21/2022   10:47 AM 04/20/2022   11:06 AM  CBC EXTENDED  WBC 4.0 -  10.5 K/uL 8.4  8.5  9.9   RBC 3.87 - 5.11 Mil/uL 4.35  4.32  4.25   Hemoglobin 12.0 - 15.0 g/dL 86.2  86.2  86.1   HCT 36.0 - 46.0 % 41.4  41.1  40.6   Platelets 150.0 - 400.0 K/uL 248.0  264.0  270.0   NEUT# 1.4 - 7.7 K/uL 5.6     Lymph# 0.7 - 4.0 K/uL 2.0        There is no height or weight on file to calculate BMI.  Orders:  Orders Placed This Encounter  Procedures   XR HIP UNILAT W OR W/O PELVIS 2-3 VIEWS LEFT   XR Lumbar Spine 2-3 Views   No orders of the defined types were placed in this encounter.    Procedures: No procedures performed  Clinical Data: No additional findings.  ROS:  All other systems negative, except as noted in the HPI. Review of Systems  Objective: Vital Signs: There were no vitals taken for this visit.  Specialty Comments:  No specialty comments available.  PMFS History: Patient Active Problem List   Diagnosis Date Noted   Prediabetes 10/24/2023   Acute left-sided  thoracic back pain 05/27/2023   Preventative health care 10/21/2022   Paresthesia 10/21/2022   Obesity (BMI 30-39.9) 10/21/2022   Former tobacco use 10/21/2022   Bilateral impacted cerumen 04/20/2022   Ganglion cyst of finger of left hand 02/09/2022   Emphysema lung (HCC) 09/09/2021   Hormone replacement therapy (HRT) 09/09/2021   Benign paroxysmal positional vertigo 09/09/2021   Aortic atherosclerosis 01/12/2019   H/O total hip arthroplasty 06/19/2014   Hx of adenomatous polyp of colon 06/13/2014   Hypothyroidism 06/06/2014   Past Medical History:  Diagnosis Date   Arthritis    Cataract 11/29/2016   rt eye   Dr Lamar Gaudy   Hx of adenomatous polyp of colon 06/13/2014   Hyperlipidemia    Hypothyroidism    Periprosthetic fracture around internal prosthetic right hip joint (HCC) 07/05/2014   Thyroid  disease    Tobacco abuse     Family History  Problem Relation Age of Onset   Diabetes type II Mother    Hypertension Mother    Diabetes Mother    Colon cancer  Neg Hx    Thyroid  disease Neg Hx    Stomach cancer Neg Hx    Esophageal cancer Neg Hx    Rectal cancer Neg Hx     Past Surgical History:  Procedure Laterality Date   CATARACT EXTRACTION Bilateral    CHONDROPLASTY Left 04/01/2020   Procedure: CHONDROPLASTY;  Surgeon: Harden Jerona GAILS, MD;  Location: [facility];  Service: Orthopedics;  Laterality: Left;   COLONOSCOPY     KNEE ARTHROSCOPY WITH LATERAL MENISECTOMY Left 04/01/2020   Procedure: KNEE ARTHROSCOPY WITH LATERAL MENISECTOMY;  Surgeon: Harden Jerona GAILS, MD;  Location: [facility];  Service: Orthopedics;  Laterality: Left;   KNEE ARTHROSCOPY WITH MEDIAL MENISECTOMY Left 04/01/2020   Procedure: LEFT KNEE ARTHROSCOPY  WITH MEDIAL AND LATERAL MENISECTOMIES, CHONDROPLASTY AND DEBRIDEMENT;  Surgeon: Harden Jerona GAILS, MD;  Location: [facility];  Service: Orthopedics;  Laterality: Left;   ORIF FEMUR FRACTURE Right 07/24/2014   Procedure: OPEN REDUCTION INTERNAL FIXATION (ORIF) DISTAL FEMUR FRACTURE;  Surgeon: Jerona Harden GAILS, MD;  Location[facility];  Service: Orthopedics;  Laterality: Right;   ORIF PERIPROSTHETIC FRACTURE Right 07/02/2014   Procedure: Open Reduction Internal Fixation Femur Fracture, Revision Femur Total Hip Arthroplasty;  Surgeon: Jerona Harden GAILS, MD;  Location: [facility];  Service: Orthopedics;  Laterality: Right;   ROTATOR CUFF REPAIR Right 09/19/2013   TOTAL HIP ARTHROPLASTY Right 06/19/2014   Procedure: TOTAL HIP ARTHROPLASTY;  Surgeon: Jerona Harden GAILS, MD;  Location: [facility];  Service: Orthopedics;  Laterality: Right;   VAGINAL HYSTERECTOMY  03/23/1999   complete   Social History   Occupational History   Not on file  Tobacco Use   Smoking status: Former    Current packs/day: 0.00    Average packs/day: 1 pack/day for 30.0 years (30.0 ttl pk-yrs)    Types: Cigarettes    Start date: 11/19/1985    Quit date: 11/20/2015    Years since quitting: 8.0   Smokeless tobacco: Never   Tobacco  comments:    Patient reports that she is gradually trying to quit  Vaping Use   Vaping status: Never Used  Substance and Sexual Activity   Alcohol use: Yes    Alcohol/week: 14.0 standard drinks of alcohol    Types: 14 Glasses of wine per week    Comment: 2 glasses of wine at night   Drug use: No   Sexual activity: Not  Currently    Birth control/protection: Surgical

## 2023-12-26 ENCOUNTER — Telehealth: Payer: Self-pay | Admitting: Orthopedic Surgery

## 2023-12-26 NOTE — Telephone Encounter (Signed)
 Pt called requesting refill of Prednisone. Pharmacy is [facility]. Pt call back number [phone].

## 2023-12-27 MED ORDER — PREDNISONE 10 MG PO TABS: 10.0000 mg | ORAL_TABLET | Freq: Every day | ORAL | 0 refills | Status: DC

## 2024-01-06 ENCOUNTER — Encounter: Payer: Self-pay | Admitting: Family

## 2024-01-19 ENCOUNTER — Ambulatory Visit (INDEPENDENT_AMBULATORY_CARE_PROVIDER_SITE_OTHER)

## 2024-01-19 VITALS — Ht 66.0 in | Wt 196.0 lb

## 2024-01-19 DIAGNOSIS — Z87891 Personal history of nicotine dependence: Secondary | ICD-10-CM | POA: Diagnosis not present

## 2024-01-19 DIAGNOSIS — Z0000 Encounter for general adult medical examination without abnormal findings: Secondary | ICD-10-CM | POA: Diagnosis not present

## 2024-01-19 DIAGNOSIS — Z122 Encounter for screening for malignant neoplasm of respiratory organs: Secondary | ICD-10-CM

## 2024-01-19 NOTE — Progress Notes (Signed)
 Subjective:   Bianca Collins is a 78 y.o. who presents for a Medicare Wellness preventive visit.  As a reminder, Annual Wellness Visits don't include a physical exam, and some assessments may be limited, especially if this visit is performed virtually. We may recommend an in-person follow-up visit with your provider if needed.  Visit Complete: Virtual I connected with  [name] on 01/19/24 by a audio enabled telemedicine application and verified that I am speaking with the correct person using two identifiers.  Patient Location: Home  Provider Location: Office/Clinic  I discussed the limitations of evaluation and management by telemedicine. The patient expressed understanding and agreed to proceed.  Vital Signs: Because this visit was a virtual/telehealth visit, some criteria may be missing or patient reported. Any vitals not documented were not able to be obtained and vitals that have been documented are patient reported.  VideoDeclined- This patient declined Librarian, academic. Therefore the visit was completed with audio only.  Persons Participating in Visit: Patient.  AWV Questionnaire: No: Patient Medicare AWV questionnaire was not completed prior to this visit.  Cardiac Risk Factors include: advanced age (>34men, >106 women)     Objective:    Today's Vitals   01/19/24 1316  Weight: 196 lb (88.9 kg)  Height: 5' 6 (1.676 m)   Body mass index is 31.64 kg/m.     01/19/2024    1:15 PM 01/20/2023    2:52 PM 01/11/2022   10:30 AM 05/25/2021    9:51 AM 05/16/2020   10:11 AM 04/01/2020    7:21 AM 03/25/2020   11:23 AM  Advanced Directives  Does Patient Have a Medical Advance Directive? Yes Yes Yes Yes Yes Yes Yes  Type of Estate Agent of [place];Living will Healthcare Power of [place];Living will Healthcare Power of [place];Living will Healthcare Power of [place] of [place];Living will Living will   Does  patient want to make changes to medical advance directive?    No - Patient declined No - Patient declined No - Patient declined No - Patient declined  Copy of Healthcare Power of Attorney in Chart? No - copy requested No - copy requested No - copy requested Yes - validated most recent copy scanned in chart (See row information) Yes - validated most recent copy scanned in chart (See row information) No - copy requested No - copy requested    Current Medications (verified) Outpatient Encounter Medications as of 01/19/2024  Medication Sig   ASPIRIN  81 PO Take by mouth daily.   atorvastatin  (LIPITOR) 10 MG tablet TAKE 1 TABLET BY MOUTH EVERY DAY   B Complex-C (B-COMPLEX WITH VITAMIN C) tablet Take 1 tablet by mouth daily.   Cholecalciferol (VITAMIN D3) 2000 units TABS Take 1 tablet by mouth daily.   estradiol  (ESTRACE ) 0.5 MG tablet Take 0.5 mg by mouth daily.   levothyroxine  (SYNTHROID ) 88 MCG tablet TAKE 1 TABLET BY MOUTH ON EMPTY STOMACH 30 MINUTES BEFORE BREAKFAST FOR THYROID    Multiple Vitamin (MULTIVITAMIN) tablet Take 1 tablet by mouth daily.   predniSONE (DELTASONE) 10 MG tablet Take 1 tablet (10 mg total) by mouth daily with breakfast.   predniSONE (DELTASONE) 50 MG tablet Take one tablet by mouth once daily for 5 days.   No facility-administered encounter medications on file as of 01/19/2024.    Allergies (verified) Patient has no known allergies.   History: Past Medical History:  Diagnosis Date   Arthritis    Cataract 11/29/2016   rt eye  Dr Lamar Gaudy   Hx of adenomatous polyp of colon 06/13/2014   Hyperlipidemia    Hypothyroidism    Periprosthetic fracture around internal prosthetic right hip joint (HCC) 07/05/2014   Thyroid  disease    Tobacco abuse    Past Surgical History:  Procedure Laterality Date   CATARACT EXTRACTION Bilateral    CHONDROPLASTY Left 04/01/2020   Procedure: CHONDROPLASTY;  Surgeon: Harden Jerona GAILS, MD;  Location: [facility];   Service: Orthopedics;  Laterality: Left;   COLONOSCOPY     KNEE ARTHROSCOPY WITH LATERAL MENISECTOMY Left 04/01/2020   Procedure: KNEE ARTHROSCOPY WITH LATERAL MENISECTOMY;  Surgeon: Harden Jerona GAILS, MD;  Location: [facility];  Service: Orthopedics;  Laterality: Left;   KNEE ARTHROSCOPY WITH MEDIAL MENISECTOMY Left 04/01/2020   Procedure: LEFT KNEE ARTHROSCOPY  WITH MEDIAL AND LATERAL MENISECTOMIES, CHONDROPLASTY AND DEBRIDEMENT;  Surgeon: Harden Jerona GAILS, MD;  Location: [facility];  Service: Orthopedics;  Laterality: Left;   ORIF FEMUR FRACTURE Right 07/24/2014   Procedure: OPEN REDUCTION INTERNAL FIXATION (ORIF) DISTAL FEMUR FRACTURE;  Surgeon: Jerona Harden GAILS, MD;  Location[facility];  Service: Orthopedics;  Laterality: Right;   ORIF PERIPROSTHETIC FRACTURE Right 07/02/2014   Procedure: Open Reduction Internal Fixation Femur Fracture, Revision Femur Total Hip Arthroplasty;  Surgeon: Jerona Harden GAILS, MD;  Location[facility];  Service: Orthopedics;  Laterality: Right;   ROTATOR CUFF REPAIR Right 09/19/2013   TOTAL HIP ARTHROPLASTY Right 06/19/2014   Procedure: TOTAL HIP ARTHROPLASTY;  Surgeon: Jerona Harden GAILS, MD;  Location[facility];  Service: Orthopedics;  Laterality: Right;   VAGINAL HYSTERECTOMY  03/23/1999   complete   Family History  Problem Relation Age of Onset   Diabetes type II Mother    Hypertension Mother    Diabetes Mother    Colon cancer Neg Hx    Thyroid  disease Neg Hx    Stomach cancer Neg Hx    Esophageal cancer Neg Hx    Rectal cancer Neg Hx    Social History   Socioeconomic History   Marital status: Widowed    Spouse name: Not on file   Number of children: 2   Years of education: Not on file   Highest education level: Not on file  Occupational History   Not on file  Tobacco Use   Smoking status: Former    Current packs/day: 0.00    Average packs/day: 1 pack/day for 30.0 years (30.0 ttl pk-yrs)    Types: Cigarettes    Start date: 11/19/1985     Quit date: 11/20/2015    Years since quitting: 8.1   Smokeless tobacco: Never   Tobacco comments:    Patient reports that she is gradually trying to quit  Vaping Use   Vaping status: Never Used  Substance and Sexual Activity   Alcohol use: Yes    Alcohol/week: 14.0 standard drinks of alcohol    Types: 14 Glasses of wine per week    Comment: 2 glasses of wine at night   Drug use: No   Sexual activity: Not Currently    Birth control/protection: Surgical  Other Topics Concern   Not on file  Social History Narrative   09/09/21   From: the area   Living: with daughter and son-in-law   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, nature       Family: [place] ([place] ) and [place] - 3 grandchildren      Enjoys: crochet, read, cross-stitch  Exercise: ymca - 3 times a week   Diet: better than what she used to       Safety   Seat belts: Yes    Guns: Yes  and secure   Safe in relationships: Yes          Social Drivers of Corporate Investment Banker Strain: Low Risk  (01/19/2024)   Overall Financial Resource Strain (CARDIA)    Difficulty of Paying Living Expenses: Not hard at all  Food Insecurity: No Food Insecurity (01/19/2024)   Hunger Vital Sign    Worried About Running Out of Food in the Last Year: Never true    Ran Out of Food in the Last Year: Never true  Transportation Needs: No Transportation Needs (01/19/2024)   PRAPARE - Administrator, Civil Service (Medical): No    Lack of Transportation (Non-Medical): No  Physical Activity: Inactive (01/19/2024)   Exercise Vital Sign    Days of Exercise per Week: 0 days    Minutes of Exercise per Session: 0 min  Stress: No Stress Concern Present (01/19/2024)   [employer] of Occupational Health - Occupational Stress Questionnaire    Feeling of Stress: Not at all  Social Connections: Socially Isolated (01/19/2024)   Social Connection and Isolation Panel    Frequency of Communication with Friends and Family:  More than three times a week    Frequency of Social Gatherings with Friends and Family: Twice a week    Attends Religious Services: Never    Database Administrator or Organizations: No    Attends Banker Meetings: Never    Marital Status: Widowed    Tobacco Counseling Counseling given: Not Answered Tobacco comments: Patient reports that she is gradually trying to quit    Clinical Intake:  Pre-visit preparation completed: Yes  Pain : No/denies pain     BMI - recorded: 31.64 Nutritional Status: BMI > 30  Obese Nutritional Risks: None Diabetes: No  Lab Results  Component Value Date   HGBA1C 6.6 (H) 10/24/2023   HGBA1C 6.2 10/25/2022     How often do you need to have someone help you when you read instructions, pamphlets, or other written materials from your doctor or pharmacy?: 1 - Never  Interpreter Needed?: No  Information entered by :: Verdie Saba, CMA   Activities of Daily Living     01/19/2024    1:19 PM 01/20/2023    2:54 PM  In your present state of health, do you have any difficulty performing the following activities:  Hearing? 0 0  Vision? 0 0  Difficulty concentrating or making decisions? 0 0  Walking or climbing stairs? 0 0  Dressing or bathing? 0 0  Doing errands, shopping? 0 0  Preparing Food and eating ? N N  Using the Toilet? N N  In the past six months, have you accidently leaked urine? CINDERELLA CINDERELLA  Comment wears a pad   Do you have problems with loss of bowel control? N N  Managing your Medications? N N  Managing your Finances? N N  Housekeeping or managing your Housekeeping? N N    Patient Care Team: Wendee Lynwood HERO, NP as PCP - General (Nurse Practitioner) [facility]  I have updated your Care Teams any recent Medical Services you may have received from other providers in the past year.     Assessment:   This is a routine wellness examination for Jadin.  Hearing/Vision screen Hearing Screening -  Comments:: Denies hearing difficulties   Vision Screening - Comments:: Wears eye glasses for reading only    Goals Addressed               This Visit's Progress     Patient Stated (pt-stated)        Patient stated she plans to start back walking       Depression Screen     01/19/2024    1:21 PM 10/24/2023   10:20 AM 01/20/2023    2:48 PM 01/11/2022   10:31 AM 09/09/2021   10:41 AM 05/25/2021   10:33 AM 10/16/2019    8:21 AM  PHQ 2/9 Scores  PHQ - 2 Score 3 0 0 0 0 0 0  PHQ- 9 Score 4 2         Fall Risk     01/19/2024    1:20 PM 10/24/2023   10:17 AM 05/27/2023    8:22 AM 01/20/2023    2:38 PM 01/11/2022   10:30 AM  Fall Risk   Falls in the past year? 0 0 0 0 0  Number falls in past yr: 0 0 0 0 0  Injury with Fall? 0 0 0 0 0  Risk for fall due to : No Fall Risks No Fall Risks No Fall Risks No Fall Risks Medication side effect  Follow up Falls evaluation completed;Falls prevention discussed Falls evaluation completed Falls evaluation completed Education provided;Falls prevention discussed Falls prevention discussed;Education provided;Falls evaluation completed      Data saved with a previous flowsheet row definition    MEDICARE RISK AT HOME:  Medicare Risk at Home Any stairs in or around the home?: Yes (outside) If so, are there any without handrails?: No Home free of loose throw rugs in walkways, pet beds, electrical cords, etc?: Yes Adequate lighting in your home to reduce risk of falls?: Yes Life alert?: No Use of a cane, walker or w/c?: No Grab bars in the bathroom?: Yes Shower chair or bench in shower?: Yes Elevated toilet seat or a handicapped toilet?: No  TIMED UP AND GO:  Was the test performed?  No  Cognitive Function: 6CIT completed    12/07/2016    9:58 AM 05/28/2015   10:27 AM 05/28/2015   10:03 AM  MMSE - Mini Mental State Exam  Not completed:  -- --  Orientation to time 5  4  4    Orientation to Place 5  5  5    Registration 3  3  3    Attention/  Calculation 5  5  5    Recall 3  3  3    Language- name 2 objects 2  2  2    Language- repeat 1 1 1   Language- follow 3 step command 3  3  3    Language- read & follow direction 1  1  1    Write a sentence 1  1  1    Copy design 0  1  1   Total score 29  29  29       Data saved with a previous flowsheet row definition        01/19/2024    1:22 PM 01/20/2023    2:56 PM 01/11/2022   10:32 AM 10/16/2019    8:25 AM 10/11/2018    3:37 PM  6CIT Screen  What Year? 0 points 0 points 0 points 0 points 0 points  What month? 0 points 0 points 0 points 0 points 0 points  What time? 0 points  0 points 0 points 0 points 0 points  Count back from 20 0 points 0 points 0 points 0 points 0 points  Months in reverse 0 points 0 points 0 points 0 points 0 points  Repeat phrase 2 points 0 points 0 points 0 points 0 points  Total Score 2 points 0 points 0 points 0 points 0 points    Immunizations Immunization History  Administered Date(s) Administered   PFIZER(Purple Top)SARS-COV-2 Vaccination 07/10/2019, 07/31/2019   Zoster Recombinant(Shingrix) 01/11/2020, 09/05/2020    Screening Tests Health Maintenance  Topic Date Due   Lung Cancer Screening  06/16/2022   Influenza Vaccine  Never done   DTaP/Tdap/Td (1 - Tdap) 01/20/2024 (Originally 02/10/1965)   Pneumococcal Vaccine: 50+ Years (1 of 2 - PCV) 01/20/2024 (Originally 02/10/1965)   Colonoscopy  12/09/2024   Medicare Annual Wellness (AWV)  01/18/2025   DEXA SCAN  Completed   Hepatitis C Screening  Completed   Zoster Vaccines- Shingrix  Completed   Meningococcal B Vaccine  Aged Out   Mammogram  Discontinued   COVID-19 Vaccine  Discontinued    Health Maintenance Items Addressed:  Referral sent for Low Dose Chest CT (smoker/hx smoking) - former smoker  I have recommended that this patient have a flu shot but she declines at this time. I have discussed the risks and benefits of this procedure with her. The patient verbalizes understanding.    Additional Screening:  Vision Screening: Recommended annual ophthalmology exams for early detection of glaucoma and other disorders of the eye. Is the patient up to date with their annual eye exam?  Yes  Who is the provider or what is the name of the office in which the patient attends annual eye exams? [facility] Dental Screening: Recommended annual dental exams for proper oral hygiene  Community Resource Referral / Chronic Care Management: CRR required this visit?  No   CCM required this visit?  No   Plan:    I have personally reviewed and noted the following in the patient's chart:   Medical and social history Use of alcohol, tobacco or illicit drugs  Current medications and supplements including opioid prescriptions. Patient is not currently taking opioid prescriptions. Functional ability and status Nutritional status Physical activity Advanced directives List of other physicians Hospitalizations, surgeries, and ER visits in previous 12 months Vitals Screenings to include cognitive, depression, and falls Referrals and appointments  In addition, I have reviewed and discussed with patient certain preventive protocols, quality metrics, and best practice recommendations. A written personalized care plan for preventive services as well as general preventive health recommendations were provided to patient.   Verdie CHRISTELLA Saba, CMA   01/19/2024   After Visit Summary: (MyChart) Due to this being a telephonic visit, the after visit summary with patients personalized plan was offered to patient via MyChart   Notes: Nothing significant to report at this time.

## 2024-01-19 NOTE — Patient Instructions (Addendum)
 [name],  Thank you for taking the time for your Medicare Wellness Visit. I appreciate your continued commitment to your health goals. Please review the care plan we discussed, and feel free to reach out if I can assist you further.  Medicare recommends these wellness visits once per year to help you and your care team stay ahead of potential health issues. These visits are designed to focus on prevention, allowing your provider to concentrate on managing your acute and chronic conditions during your regular appointments.  Please note that Annual Wellness Visits do not include a physical exam. Some assessments may be limited, especially if the visit was conducted virtually. If needed, we may recommend a separate in-person follow-up with your provider.  Ongoing Care Seeing your primary care provider every 3 to 6 months helps us  monitor your health and provide consistent, personalized care.   Referrals If a referral was made during today's visit and you haven't received any updates within two weeks, please contact the referred provider directly to check on the status.  Recommended Screenings:  Health Maintenance  Topic Date Due   Screening for Lung Cancer  06/16/2022   Flu Shot  Never done   DTaP/Tdap/Td vaccine (1 - Tdap) 01/20/2024*   Pneumococcal Vaccine for age over 49 (1 of 2 - PCV) 01/20/2024*   Colon Cancer Screening  12/09/2024   Medicare Annual Wellness Visit  01/18/2025   DEXA scan (bone density measurement)  Completed   Hepatitis C Screening  Completed   Zoster (Shingles) Vaccine  Completed   Meningitis B Vaccine  Aged Out   Breast Cancer Screening  Discontinued   COVID-19 Vaccine  Discontinued  *Topic was postponed. The date shown is not the original due date.       01/19/2024    1:15 PM  Advanced Directives  Does Patient Have a Medical Advance Directive? Yes  Type of Estate Agent of [place];Living will  Copy of Healthcare Power of Attorney in  Chart? No - copy requested   Advance Care Planning is important because it: Ensures you receive medical care that aligns with your values, goals, and preferences. Provides guidance to your family and loved ones, reducing the emotional burden of decision-making during critical moments.  Vision: Annual vision screenings are recommended for early detection of glaucoma, cataracts, and diabetic retinopathy. These exams can also reveal signs of chronic conditions such as diabetes and high blood pressure.  Dental: Annual dental screenings help detect early signs of oral cancer, gum disease, and other conditions linked to overall health, including heart disease and diabetes.

## 2024-01-20 ENCOUNTER — Telehealth: Payer: Self-pay | Admitting: Family

## 2024-01-20 NOTE — Telephone Encounter (Signed)
 Patient called. Says the 10mg  prednisone does not work. She would like something stronger called in. Her cb# [phone]

## 2024-01-23 ENCOUNTER — Encounter: Payer: Self-pay | Admitting: Radiology

## 2024-01-23 ENCOUNTER — Other Ambulatory Visit: Payer: Self-pay | Admitting: Family

## 2024-01-23 DIAGNOSIS — M25552 Pain in left hip: Secondary | ICD-10-CM

## 2024-01-23 DIAGNOSIS — M5416 Radiculopathy, lumbar region: Secondary | ICD-10-CM

## 2024-01-24 ENCOUNTER — Telehealth: Payer: Self-pay | Admitting: Family

## 2024-01-24 MED ORDER — PREDNISONE 50 MG PO TABS: ORAL_TABLET | ORAL | 0 refills | Status: AC

## 2024-01-24 NOTE — Telephone Encounter (Signed)
 Patient called [phone] your call. [phone]

## 2024-02-28 ENCOUNTER — Other Ambulatory Visit: Payer: Self-pay | Admitting: Nurse Practitioner

## 2024-02-28 DIAGNOSIS — I700 Atherosclerosis of aorta: Secondary | ICD-10-CM

## 2024-03-09 ENCOUNTER — Encounter: Payer: Self-pay | Admitting: Radiology

## 2024-10-25 ENCOUNTER — Encounter: Admitting: Nurse Practitioner

## 2025-01-23 ENCOUNTER — Ambulatory Visit
